# Patient Record
Sex: Female | Born: 1950 | Race: Black or African American | Hispanic: No | Smoking: Never smoker
Health system: Southern US, Community
[De-identification: ages and names within clinical notes are randomized; demographics above are authoritative.]

## PROBLEM LIST (undated history)

## (undated) DIAGNOSIS — E785 Hyperlipidemia, unspecified: Secondary | ICD-10-CM

## (undated) DIAGNOSIS — H269 Unspecified cataract: Secondary | ICD-10-CM

## (undated) DIAGNOSIS — E079 Disorder of thyroid, unspecified: Secondary | ICD-10-CM

## (undated) DIAGNOSIS — I10 Essential (primary) hypertension: Secondary | ICD-10-CM

## (undated) HISTORY — DX: Disorder of thyroid, unspecified: E07.9

## (undated) HISTORY — DX: Hyperlipidemia, unspecified: E78.5

## (undated) HISTORY — DX: Essential (primary) hypertension: I10

## (undated) HISTORY — DX: Unspecified cataract: H26.9

## (undated) HISTORY — PX: CATARACT EXTRACTION: SUR2

---

## 1971-07-29 HISTORY — PX: TONSILLECTOMY: SHX5217

## 1998-11-29 ENCOUNTER — Other Ambulatory Visit: Admission: RE | Admit: 1998-11-29 | Discharge: 1998-11-29 | Payer: Self-pay | Admitting: *Deleted

## 1999-10-07 ENCOUNTER — Other Ambulatory Visit: Admission: RE | Admit: 1999-10-07 | Discharge: 1999-10-07 | Payer: Self-pay | Admitting: Obstetrics & Gynecology

## 2001-05-26 ENCOUNTER — Other Ambulatory Visit: Admission: RE | Admit: 2001-05-26 | Discharge: 2001-05-26 | Payer: Self-pay | Admitting: Obstetrics & Gynecology

## 2001-09-24 ENCOUNTER — Encounter: Admission: RE | Admit: 2001-09-24 | Discharge: 2001-12-23 | Payer: Self-pay | Admitting: Family Medicine

## 2003-05-30 ENCOUNTER — Other Ambulatory Visit: Admission: RE | Admit: 2003-05-30 | Discharge: 2003-05-30 | Payer: Self-pay | Admitting: Obstetrics & Gynecology

## 2003-07-05 ENCOUNTER — Ambulatory Visit (HOSPITAL_COMMUNITY): Admission: RE | Admit: 2003-07-05 | Discharge: 2003-07-05 | Payer: Self-pay | Admitting: Family Medicine

## 2004-11-06 ENCOUNTER — Other Ambulatory Visit: Admission: RE | Admit: 2004-11-06 | Discharge: 2004-11-06 | Payer: Self-pay | Admitting: Obstetrics & Gynecology

## 2007-07-29 HISTORY — PX: COLONOSCOPY: SHX174

## 2009-05-22 ENCOUNTER — Ambulatory Visit (HOSPITAL_COMMUNITY): Admission: RE | Admit: 2009-05-22 | Discharge: 2009-05-22 | Payer: Self-pay | Admitting: Family Medicine

## 2009-05-29 ENCOUNTER — Encounter: Admission: RE | Admit: 2009-05-29 | Discharge: 2009-05-29 | Payer: Self-pay | Admitting: Family Medicine

## 2009-11-27 ENCOUNTER — Encounter: Admission: RE | Admit: 2009-11-27 | Discharge: 2009-11-27 | Payer: Self-pay | Admitting: Family Medicine

## 2011-01-21 ENCOUNTER — Other Ambulatory Visit: Payer: Self-pay | Admitting: Family Medicine

## 2011-01-21 DIAGNOSIS — N6089 Other benign mammary dysplasias of unspecified breast: Secondary | ICD-10-CM

## 2011-03-01 ENCOUNTER — Encounter: Payer: Self-pay | Admitting: Gastroenterology

## 2011-03-18 ENCOUNTER — Ambulatory Visit
Admission: RE | Admit: 2011-03-18 | Discharge: 2011-03-18 | Disposition: A | Discharge status: Home / Self Care | Payer: BC Managed Care – PPO | Source: Ambulatory Visit | Attending: Family Medicine | Admitting: Family Medicine

## 2011-03-18 DIAGNOSIS — N6089 Other benign mammary dysplasias of unspecified breast: Secondary | ICD-10-CM

## 2011-12-08 ENCOUNTER — Encounter: Payer: BC Managed Care – PPO | Attending: Internal Medicine | Admitting: *Deleted

## 2011-12-08 ENCOUNTER — Encounter: Payer: Self-pay | Admitting: *Deleted

## 2011-12-08 DIAGNOSIS — E119 Type 2 diabetes mellitus without complications: Secondary | ICD-10-CM | POA: Insufficient documentation

## 2011-12-08 DIAGNOSIS — Z713 Dietary counseling and surveillance: Secondary | ICD-10-CM | POA: Insufficient documentation

## 2011-12-08 NOTE — Patient Instructions (Addendum)
Goals:  Follow Diabetes Meal Plan as instructed  Eat 3 meals and 2 snacks, every 3-5 hrs  Limit carbohydrate intake to 45 grams carbohydrate/meal  Limit carbohydrate intake to 15 grams carbohydrate/snack  Add lean protein foods to all meals/snacks  Monitor glucose levels two times daily (or as instructed by your doctor):  Fasting - before any medications, food, or drink  2 hours after the first bite of your largest meal  Bring glucose log to your next nutrition visit  Aim for >30 mins of physical activity daily

## 2011-12-08 NOTE — Progress Notes (Signed)
  Medical Nutrition Therapy:  Appt start time: 1130  end time:  1230.  Assessment:  Primary concerns today: T2DM, uncontrolled (250.02).  Jennifer Payne is here today with an A1c of 15.5% (10/13/11) for assessment of T2DM. Had previous education "a long time ago", though does not remember how to CHO count. Went over how to do this. Checks FBG daily and reports 103-128 mg (with one outlier of 175 mg). States her BG has dropped from 300 mg + daily since starting to work with Dr. Baird [facility].  Also reports she has started walking 1-2 days/week.  *See DM flowsheet for more information.   MEDICATIONS: See medication list; reconciled with pt.    DIETARY INTAKE:  Usual eating pattern includes 1-2 meals and 0-2 snacks per day.  24-hr recall:  B (AM): Coffee, small tomato OR leftovers (i.e stewed squash, etc) - weekends has oatmeal and a pack of nabs with granddaughter Snk (AM): None or apple  L (PM): Soup and salad with crackers or croutons Snk (PM): apple D (PM): None Snk (PM): None OR hot chocolate Beverages: Coffee, water (every now and then has hot chocolate or sweet tea)  Estimated energy needs: 1300-1400 calories 145-160 g carbohydrates 85-95 g protein 40-45 g fat  Progress Towards Goal(s):  In progress.   Nutritional Diagnosis:  [place]-2.1 Inpaired nutrition utilization As related to glucose metabolism.  As evidenced by recent A1c of 15.5% and MD referral for MNT.    Intervention:  Nutrition education.  Handouts given during visit include:  Living Well with Diabetes - [employer]  Target Blood Glucose Levels  Low CHO Snack List  [employer] Guide  Monitoring/Evaluation:  Dietary intake, exercise, A1c, BG trends, and body weight in 8 week(s).

## 2012-02-05 ENCOUNTER — Encounter: Payer: BC Managed Care – PPO | Attending: Internal Medicine | Admitting: *Deleted

## 2012-02-05 ENCOUNTER — Encounter: Payer: Self-pay | Admitting: *Deleted

## 2012-02-05 DIAGNOSIS — Z713 Dietary counseling and surveillance: Secondary | ICD-10-CM | POA: Insufficient documentation

## 2012-02-05 DIAGNOSIS — E119 Type 2 diabetes mellitus without complications: Secondary | ICD-10-CM | POA: Insufficient documentation

## 2012-02-05 NOTE — Patient Instructions (Addendum)
Goals:  Follow Diabetes Meal Plan as instructed  Eat 3 meals and 2 snacks, every 3-5 hrs  Limit carbohydrate intake to 45 grams carbohydrate/meal  Limit carbohydrate intake to 15 grams carbohydrate/snack  Add lean protein foods to all meals/snacks  Monitor glucose levels two times daily (or as instructed by your doctor):  Fasting - before any medications, food, or drink  2 hours after the first bite of your largest meal  Bring glucose log to your next nutrition visit  Aim for >30 mins of physical activity daily

## 2012-02-05 NOTE — Progress Notes (Signed)
  Medical Nutrition Therapy:   Appt start time: 1130  end time:  1200.  Primary concerns today: T2DM, uncontrolled (250.02); F/U.  Jennifer Payne is here today for f/u and reports a new A1c of 8.2% (12/2011 per pt).  FBS: 109-112 mg; 138 mg outlier.  No exercise d/t family in town and high school reunion. Tries to walk some and do upper body exercises at night.  Rarely eats after 6-7 pm. Doing well, but not counting CHO at this time.   MEDICATIONS: See medication list; reconciled with pt.    DIETARY INTAKE:  Usual eating pattern includes 2-3 meals and 0-2 snacks per day.  24-hr recall:  B (AM): Almonds, walnuts, oat bran mixture Snk (AM): Crackers/peanut butter or more mixture from above  L (PM): Soup and salad with crackers or croutons OR [employer] chili Snk (PM): None D (PM): 2 pcs chicken (fried), small biscuit; sweet tea Snk (PM): None Beverages: Coffee, water (every now and then has hot chocolate or sweet tea)  Estimated energy needs: 1300-1400 calories 145-160 g carbohydrates 85-95 g protein 40-45 g fat  Progress Towards Goal(s):  Resolved.   Nutritional Diagnosis:  Resolved    Intervention:  Nutrition education.  Monitoring/Evaluation:  Dietary intake, exercise, A1c, BG trends, and body weight in prn.

## 2012-03-15 ENCOUNTER — Other Ambulatory Visit: Payer: Self-pay | Admitting: Internal Medicine

## 2012-03-15 DIAGNOSIS — Z1231 Encounter for screening mammogram for malignant neoplasm of breast: Secondary | ICD-10-CM

## 2012-04-01 ENCOUNTER — Ambulatory Visit
Admission: RE | Admit: 2012-04-01 | Discharge: 2012-04-01 | Disposition: A | Discharge status: Home / Self Care | Payer: BC Managed Care – PPO | Source: Ambulatory Visit | Attending: Internal Medicine | Admitting: Internal Medicine

## 2012-04-01 DIAGNOSIS — Z1231 Encounter for screening mammogram for malignant neoplasm of breast: Secondary | ICD-10-CM

## 2012-04-15 ENCOUNTER — Encounter: Payer: Self-pay | Admitting: Gastroenterology

## 2013-02-24 ENCOUNTER — Other Ambulatory Visit: Payer: Self-pay

## 2013-02-24 DIAGNOSIS — Z1231 Encounter for screening mammogram for malignant neoplasm of breast: Secondary | ICD-10-CM

## 2013-04-07 ENCOUNTER — Ambulatory Visit
Admission: RE | Admit: 2013-04-07 | Discharge: 2013-04-07 | Disposition: A | Discharge status: Home / Self Care | Payer: BC Managed Care – PPO | Source: Ambulatory Visit

## 2013-04-07 DIAGNOSIS — Z1231 Encounter for screening mammogram for malignant neoplasm of breast: Secondary | ICD-10-CM

## 2013-06-28 ENCOUNTER — Ambulatory Visit: Payer: Self-pay

## 2014-02-27 ENCOUNTER — Other Ambulatory Visit: Payer: Self-pay

## 2014-02-27 DIAGNOSIS — Z1231 Encounter for screening mammogram for malignant neoplasm of breast: Secondary | ICD-10-CM

## 2014-03-07 ENCOUNTER — Encounter: Payer: Self-pay | Admitting: Gastroenterology

## 2014-04-19 ENCOUNTER — Ambulatory Visit
Admission: RE | Admit: 2014-04-19 | Discharge: 2014-04-19 | Disposition: A | Discharge status: Home / Self Care | Payer: BC Managed Care – PPO | Source: Ambulatory Visit

## 2014-04-19 DIAGNOSIS — Z1231 Encounter for screening mammogram for malignant neoplasm of breast: Secondary | ICD-10-CM

## 2014-05-10 ENCOUNTER — Ambulatory Visit (AMBULATORY_SURGERY_CENTER): Payer: Self-pay | Admitting: *Deleted

## 2014-05-10 VITALS — Ht 67.0 in | Wt 245.0 lb

## 2014-05-10 DIAGNOSIS — Z1211 Encounter for screening for malignant neoplasm of colon: Secondary | ICD-10-CM

## 2014-05-10 MED ORDER — MOVIPREP 100 G PO SOLR: ORAL | Status: DC

## 2014-05-10 NOTE — Progress Notes (Signed)
Patient denies any allergies to eggs or soy. Patient denies any problems with anesthesia/sedation. Patient denies any oxygen use at home and does not take any diet/weight loss medications. EMMI education assisgned to patient on colonoscopy, this was explained and instructions given to patient. 

## 2014-05-24 ENCOUNTER — Encounter: Payer: Self-pay | Admitting: Gastroenterology

## 2014-05-24 ENCOUNTER — Ambulatory Visit (AMBULATORY_SURGERY_CENTER): Payer: BC Managed Care – PPO | Admitting: Gastroenterology

## 2014-05-24 VITALS — BP 160/87 | HR 66 | Temp 96.6°F | Resp 22 | Ht 67.0 in | Wt 245.0 lb

## 2014-05-24 DIAGNOSIS — Z1211 Encounter for screening for malignant neoplasm of colon: Secondary | ICD-10-CM

## 2014-05-24 DIAGNOSIS — K5730 Diverticulosis of large intestine without perforation or abscess without bleeding: Secondary | ICD-10-CM

## 2014-05-24 MED ORDER — DEXTROSE 5 % IV SOLN: INTRAVENOUS | Status: DC

## 2014-05-24 NOTE — Patient Instructions (Signed)
Impressions/recommendations:  Diverticulosis (handout given) High fiber diet (handout given)  Repeat colonoscopy in 10 years.  YOU HAD AN ENDOSCOPIC PROCEDURE TODAY AT [facility]: Refer to the procedure report that was given to you for any specific questions about what was found during the examination.  If the procedure report does not answer your questions, please call your gastroenterologist to clarify.  If you requested that your care partner not be given the details of your procedure findings, then the procedure report has been included in a sealed envelope for you to review at your convenience later.  YOU SHOULD EXPECT: Some feelings of bloating in the abdomen. Passage of more gas than usual.  Walking can help get rid of the air that was put into your GI tract during the procedure and reduce the bloating. If you had a lower endoscopy (such as a colonoscopy or flexible sigmoidoscopy) you may notice spotting of blood in your stool or on the toilet paper. If you underwent a bowel prep for your procedure, then you may not have a normal bowel movement for a few days.  DIET: Your first meal following the procedure should be a light meal and then it is ok to progress to your normal diet.  A half-sandwich or bowl of soup is an example of a good first meal.  Heavy or fried foods are harder to digest and may make you feel nauseous or bloated.  Likewise meals heavy in dairy and vegetables can cause extra gas to form and this can also increase the bloating.  Drink plenty of fluids but you should avoid alcoholic beverages for 24 hours.  ACTIVITY: Your care partner should take you home directly after the procedure.  You should plan to take it easy, moving slowly for the rest of the day.  You can resume normal activity the day after the procedure however you should NOT DRIVE or use heavy machinery for 24 hours (because of the sedation medicines used during the test).    SYMPTOMS TO REPORT  IMMEDIATELY: A gastroenterologist can be reached at any hour.  During normal business hours, 8:30 AM to 5:00 PM Monday through Friday, call [phone].  After hours and on weekends, please call the GI answering service at [phone] who will take a message and have the physician on call contact you.   Following lower endoscopy (colonoscopy or flexible sigmoidoscopy):  Excessive amounts of blood in the stool  Significant tenderness or worsening of abdominal pains  Swelling of the abdomen that is new, acute  Fever of 100F or higher  FOLLOW UP: If any biopsies were taken you will be contacted by phone or by letter within the next 1-3 weeks.  Call your gastroenterologist if you have not heard about the biopsies in 3 weeks.  Our staff will call the home number listed on your records the next business day following your procedure to check on you and address any questions or concerns that you may have at that time regarding the information given to you following your procedure. This is a courtesy call and so if there is no answer at the home number and we have not heard from you through the emergency physician on call, we will assume that you have returned to your regular daily activities without incident.  SIGNATURES/CONFIDENTIALITY: You and/or your care partner have signed paperwork which will be entered into your electronic medical record.  These signatures attest to the fact that that the information above on your  After Visit Summary has been reviewed and is understood.  Full responsibility of the confidentiality of this discharge information lies with you and/or your care-partner.

## 2014-05-24 NOTE — Progress Notes (Signed)
Report to PACU, RN, vss, BBS= Clear.  

## 2014-05-24 NOTE — Op Note (Signed)
[facility]  [address] [place], [id number]   COLONOSCOPY PROCEDURE REPORT  PATIENT: Jennifer Payne, Jennifer Payne  MR#: [id number] BIRTHDATE: 1951/07/25 , 25  yrs. old GENDER: female ENDOSCOPIST: Inda Castle, MD REFERRED BY: PROCEDURE DATE:  05/24/2014 PROCEDURE:   Colonoscopy, diagnostic First Screening Colonoscopy - Avg.  risk and is 50 yrs.  old or older - No.  Prior Negative Screening - Now for repeat screening. 10 or more years since last screening  History of Adenoma - Now for follow-up colonoscopy & has been > or = to 3 yrs.  N/A  Polyps Removed Today? No.  Recommend repeat exam, <10 yrs? No. ASA CLASS:   Class II INDICATIONS:average risk for colon cancer. MEDICATIONS: Monitored anesthesia care and Propofol 220 mg IV  DESCRIPTION OF PROCEDURE:   After the risks benefits and alternatives of the procedure were thoroughly explained, informed consent was obtained.  The digital rectal exam revealed no abnormalities of the rectum.   The [id number]  endoscope was introduced through the anus and advanced to the cecum, which was identified by both the appendix and ileocecal valve. No adverse events experienced.   The quality of the prep was Suprep good  The instrument was then slowly withdrawn as the colon was fully examined.      COLON FINDINGS: There was mild diverticulosis noted in the sigmoid colon. The remainder the colon including rectum, descending, transverse, descending colon and cecum were normal. Retroflexed views revealed no abnormalities. The time to cecum=5 minutes 23 seconds.  Withdrawal time=7 minutes 29 seconds.  The scope was withdrawn and the procedure completed. COMPLICATIONS: There were no immediate complications.  ENDOSCOPIC IMPRESSION: Mild diverticulosis was noted in the sigmoid colon  RECOMMENDATIONS: Continue current colorectal screening recommendations for "routine risk" patients with a repeat colonoscopy in 10  years.  eSigned:  Inda Castle, MD 05/24/2014 9:55 AM   cc:

## 2014-05-25 ENCOUNTER — Telehealth: Payer: Self-pay | Admitting: Gastroenterology

## 2014-05-25 ENCOUNTER — Telehealth: Payer: Self-pay

## 2014-05-25 NOTE — Telephone Encounter (Signed)
Spoke with pt and she c/o "sneezing and sniffling" since procedure.  No fever.  I told her that many pts c/o this after receiving the oxygen via [employer].  I told her to call back if symptoms persist and understanding voiced

## 2014-05-25 NOTE — Telephone Encounter (Signed)
  Follow up Call-  Call back number 05/24/2014  Post procedure Call Back phone  # [phone]  Permission to leave phone message Yes     Patient questions:  Do you have a fever, pain , or abdominal swelling? No. Pain Score  0 *  Have you tolerated food without any problems? Yes.    Have you been able to return to your normal activities? Yes.    Do you have any questions about your discharge instructions: Diet   No. Medications  No. Follow up visit  No.  Do you have questions or concerns about your Care? No.  Actions: * If pain score is 4 or above: No action needed, pain <4.  No problems noted per the pt. maw

## 2015-03-20 ENCOUNTER — Other Ambulatory Visit: Payer: Self-pay

## 2015-03-20 DIAGNOSIS — Z1231 Encounter for screening mammogram for malignant neoplasm of breast: Secondary | ICD-10-CM

## 2015-05-02 ENCOUNTER — Ambulatory Visit
Admission: RE | Admit: 2015-05-02 | Discharge: 2015-05-02 | Disposition: A | Discharge status: Home / Self Care | Payer: BC Managed Care – PPO | Source: Ambulatory Visit

## 2015-05-02 DIAGNOSIS — Z1231 Encounter for screening mammogram for malignant neoplasm of breast: Secondary | ICD-10-CM

## 2016-03-17 ENCOUNTER — Other Ambulatory Visit: Payer: Self-pay | Admitting: Internal Medicine

## 2016-03-17 ENCOUNTER — Ambulatory Visit
Admission: RE | Admit: 2016-03-17 | Discharge: 2016-03-17 | Disposition: A | Discharge status: Home / Self Care | Payer: BC Managed Care – PPO | Source: Ambulatory Visit | Attending: Internal Medicine | Admitting: Internal Medicine

## 2016-03-17 DIAGNOSIS — Z1231 Encounter for screening mammogram for malignant neoplasm of breast: Secondary | ICD-10-CM

## 2016-03-17 DIAGNOSIS — M7989 Other specified soft tissue disorders: Principal | ICD-10-CM

## 2016-03-17 DIAGNOSIS — M79661 Pain in right lower leg: Secondary | ICD-10-CM

## 2016-05-06 ENCOUNTER — Ambulatory Visit: Payer: BC Managed Care – PPO

## 2016-05-08 ENCOUNTER — Ambulatory Visit
Admission: RE | Admit: 2016-05-08 | Discharge: 2016-05-08 | Disposition: A | Discharge status: Home / Self Care | Payer: BC Managed Care – PPO | Source: Ambulatory Visit | Attending: Internal Medicine | Admitting: Internal Medicine

## 2016-05-08 DIAGNOSIS — Z1231 Encounter for screening mammogram for malignant neoplasm of breast: Secondary | ICD-10-CM

## 2017-03-12 ENCOUNTER — Other Ambulatory Visit: Payer: Self-pay | Admitting: Internal Medicine

## 2017-03-12 DIAGNOSIS — Z1231 Encounter for screening mammogram for malignant neoplasm of breast: Secondary | ICD-10-CM

## 2017-05-20 ENCOUNTER — Ambulatory Visit
Admission: RE | Admit: 2017-05-20 | Discharge: 2017-05-20 | Disposition: A | Discharge status: Home / Self Care | Payer: BC Managed Care – PPO | Source: Ambulatory Visit | Attending: Internal Medicine | Admitting: Internal Medicine

## 2017-05-20 DIAGNOSIS — Z1231 Encounter for screening mammogram for malignant neoplasm of breast: Secondary | ICD-10-CM

## 2017-08-07 ENCOUNTER — Other Ambulatory Visit: Payer: Self-pay

## 2017-08-07 ENCOUNTER — Emergency Department (HOSPITAL_BASED_OUTPATIENT_CLINIC_OR_DEPARTMENT_OTHER): Payer: BC Managed Care – PPO

## 2017-08-07 ENCOUNTER — Emergency Department (HOSPITAL_BASED_OUTPATIENT_CLINIC_OR_DEPARTMENT_OTHER)
Admission: EM | Admit: 2017-08-07 | Discharge: 2017-08-07 | Disposition: A | Discharge status: Home / Self Care | Payer: BC Managed Care – PPO | Attending: Emergency Medicine | Admitting: Emergency Medicine

## 2017-08-07 ENCOUNTER — Encounter (HOSPITAL_BASED_OUTPATIENT_CLINIC_OR_DEPARTMENT_OTHER): Payer: Self-pay | Admitting: *Deleted

## 2017-08-07 DIAGNOSIS — R0602 Shortness of breath: Secondary | ICD-10-CM | POA: Diagnosis not present

## 2017-08-07 DIAGNOSIS — J069 Acute upper respiratory infection, unspecified: Secondary | ICD-10-CM | POA: Diagnosis not present

## 2017-08-07 DIAGNOSIS — E119 Type 2 diabetes mellitus without complications: Secondary | ICD-10-CM | POA: Insufficient documentation

## 2017-08-07 DIAGNOSIS — I10 Essential (primary) hypertension: Secondary | ICD-10-CM | POA: Insufficient documentation

## 2017-08-07 DIAGNOSIS — Z794 Long term (current) use of insulin: Secondary | ICD-10-CM | POA: Diagnosis not present

## 2017-08-07 DIAGNOSIS — Z79899 Other long term (current) drug therapy: Secondary | ICD-10-CM | POA: Insufficient documentation

## 2017-08-07 DIAGNOSIS — Z7982 Long term (current) use of aspirin: Secondary | ICD-10-CM | POA: Diagnosis not present

## 2017-08-07 DIAGNOSIS — R05 Cough: Secondary | ICD-10-CM | POA: Diagnosis not present

## 2017-08-07 NOTE — ED Triage Notes (Signed)
Could symptoms. Cough and chest congestion.

## 2017-08-07 NOTE — ED Provider Notes (Signed)
[facility]PART[facility] Provider Note   CSN: [id number] Arrival date & time: 08/07/17  1335     History   Chief Complaint Chief Complaint  Patient presents with  . Cough    HPI Jennifer Payne is a 67 y.o. female.  HPI Patient presents with cough over the last week.  Minimal production.  If it does produce it is clear.  States she has not coughed up and is been green.  Slight dull chest pain.  States she is worried she could have pneumonia.  May have had some chills last night.  Otherwise feels well.  Good oral intake.  No swelling in her legs.  No abdominal pain.  No clear sick contacts. Past Medical History:  Diagnosis Date  . Cataract   . Diabetes mellitus   . Hyperlipidemia   . Hypertension   . Thyroid disease     There are no active problems to display for this patient.   Past Surgical History:  Procedure Laterality Date  . CATARACT EXTRACTION    . COLONOSCOPY  2009  . TONSILLECTOMY  1973    OB History    No data available       Home Medications    Prior to Admission medications   Medication Sig Start Date End Date Taking? Authorizing Provider  atorvastatin (LIPITOR) 80 MG tablet Take 80 mg by mouth daily.   Yes [provider]  furosemide (LASIX) 40 MG tablet Take 40 mg by mouth daily.   Yes [provider]  insulin detemir (LEVEMIR) 100 UNIT/ML injection Inject 15 Units into the skin at bedtime.    Yes [provider]  levothyroxine (SYNTHROID) 50 MCG tablet Take 88 mcg by mouth daily.    Yes [provider]  amLODipine-olmesartan (AZOR) 10-40 MG per tablet Take 1 tablet by mouth daily.    [provider]  aspirin 81 MG tablet Take 81 mg by mouth daily.    [provider]  carvedilol (COREG) 6.25 MG tablet Take 1 tablet by mouth 2 (two) times daily.    [provider]  Iron-Vitamins (GERITOL PO) Take 1 tablet by mouth as needed.    [provider]    Family  History Family History  Problem Relation Age of Onset  . Alzheimer's disease Mother   . Coronary artery disease Mother   . Hyperlipidemia Mother   . Obesity Mother   . Hypertension Father   . Colon cancer Neg Hx     Social History Social History   Tobacco Use  . Smoking status: Never Smoker  . Smokeless tobacco: Never Used  Substance Use Topics  . Alcohol use: No  . Drug use: No     Allergies   Erythromycin   Review of Systems Review of Systems  Constitutional: Negative for appetite change and fever.  HENT: Negative for congestion.   Eyes: Negative for photophobia.  Respiratory: Positive for cough. Negative for shortness of breath.   Cardiovascular: Negative for chest pain.  Gastrointestinal: Negative for abdominal pain.  Genitourinary: Negative for difficulty urinating.  Musculoskeletal: Negative for back pain.  Neurological: Negative for light-headedness.  Psychiatric/Behavioral: Negative for confusion.     Physical Exam Updated Vital Signs BP (!) 185/103 (BP Location: Left Arm)   Pulse 66   Temp 97.9 F (36.6 C) (Oral)   Resp 16   Ht 5\' 7"  (1.702 m)   Wt 104.3 kg (230 lb)   SpO2 100%   BMI 36.02 kg/m  Physical Exam  Constitutional: She appears well-developed.  HENT:  Head: Normocephalic.  Neck: Neck supple.  Cardiovascular: Normal rate.  Pulmonary/Chest: Effort normal. No stridor. She has no wheezes. She has no rales.  Abdominal: There is no tenderness.  Musculoskeletal: She exhibits no edema.  Neurological: She is alert.  Skin: Skin is warm. Capillary refill takes less than 2 seconds.     ED Treatments / Results  Labs (all labs ordered are listed, but only abnormal results are displayed) Labs Reviewed - No data to display  EKG  EKG Interpretation None       Radiology Dg Chest 2 View  Result Date: 08/07/2017 CLINICAL DATA:  Productive cough with shortness of breath since last Friday EXAM: CHEST  2 VIEW COMPARISON:  June 15, 2015 FINDINGS: The heart size and mediastinal contours are stable. Heart size is enlarged. There is no focal pneumonia or pleural effusion. Prominent bilateral central pulmonary vessel caliber is identified unchanged. The visualized skeletal structures are stable. IMPRESSION: No focal pneumonia. Chronic central pulmonary vascular congestion unchanged compared prior exam. Electronically Signed   By: Abelardo Diesel M.D.   On: 08/07/2017 14:31    Procedures Procedures (including critical care time)  Medications Ordered in ED Medications - No data to display   Initial Impression / Assessment and Plan / ED Course  I have reviewed the triage vital signs and the nursing notes.  Pertinent labs & imaging results that were available during my care of the patient were reviewed by me and considered in my medical decision making (see chart for details).     Patient with URI symptoms.  X-ray negative for pneumonia.  Nonfocal lung findings.  Has hypertension here but states she did not take her medicine today.  Will discharge home.  Final Clinical Impressions(s) / ED Diagnoses   Final diagnoses:  Upper respiratory tract infection, unspecified type    ED Discharge Orders    None       Davonna Belling, MD 08/07/17 1553

## 2017-09-24 DIAGNOSIS — I129 Hypertensive chronic kidney disease with stage 1 through stage 4 chronic kidney disease, or unspecified chronic kidney disease: Secondary | ICD-10-CM | POA: Diagnosis not present

## 2017-09-24 DIAGNOSIS — E039 Hypothyroidism, unspecified: Secondary | ICD-10-CM | POA: Diagnosis not present

## 2017-09-24 DIAGNOSIS — N184 Chronic kidney disease, stage 4 (severe): Secondary | ICD-10-CM | POA: Diagnosis not present

## 2017-09-24 DIAGNOSIS — E1122 Type 2 diabetes mellitus with diabetic chronic kidney disease: Secondary | ICD-10-CM | POA: Diagnosis not present

## 2017-10-07 DIAGNOSIS — I129 Hypertensive chronic kidney disease with stage 1 through stage 4 chronic kidney disease, or unspecified chronic kidney disease: Secondary | ICD-10-CM | POA: Diagnosis not present

## 2017-10-07 DIAGNOSIS — N184 Chronic kidney disease, stage 4 (severe): Secondary | ICD-10-CM | POA: Diagnosis not present

## 2017-10-07 DIAGNOSIS — D631 Anemia in chronic kidney disease: Secondary | ICD-10-CM | POA: Diagnosis not present

## 2017-10-07 DIAGNOSIS — E113599 Type 2 diabetes mellitus with proliferative diabetic retinopathy without macular edema, unspecified eye: Secondary | ICD-10-CM | POA: Diagnosis not present

## 2017-10-07 DIAGNOSIS — E1165 Type 2 diabetes mellitus with hyperglycemia: Secondary | ICD-10-CM | POA: Diagnosis not present

## 2017-10-07 DIAGNOSIS — N2581 Secondary hyperparathyroidism of renal origin: Secondary | ICD-10-CM | POA: Diagnosis not present

## 2017-10-07 DIAGNOSIS — E1122 Type 2 diabetes mellitus with diabetic chronic kidney disease: Secondary | ICD-10-CM | POA: Diagnosis not present

## 2017-10-07 DIAGNOSIS — N183 Chronic kidney disease, stage 3 (moderate): Secondary | ICD-10-CM | POA: Diagnosis not present

## 2018-01-14 DIAGNOSIS — N184 Chronic kidney disease, stage 4 (severe): Secondary | ICD-10-CM | POA: Diagnosis not present

## 2018-01-14 DIAGNOSIS — Z0000 Encounter for general adult medical examination without abnormal findings: Secondary | ICD-10-CM | POA: Diagnosis not present

## 2018-01-14 DIAGNOSIS — E039 Hypothyroidism, unspecified: Secondary | ICD-10-CM | POA: Diagnosis not present

## 2018-01-14 DIAGNOSIS — E113559 Type 2 diabetes mellitus with stable proliferative diabetic retinopathy, unspecified eye: Secondary | ICD-10-CM | POA: Diagnosis not present

## 2018-01-14 DIAGNOSIS — E559 Vitamin D deficiency, unspecified: Secondary | ICD-10-CM | POA: Diagnosis not present

## 2018-01-14 DIAGNOSIS — I129 Hypertensive chronic kidney disease with stage 1 through stage 4 chronic kidney disease, or unspecified chronic kidney disease: Secondary | ICD-10-CM | POA: Diagnosis not present

## 2018-01-14 DIAGNOSIS — E1169 Type 2 diabetes mellitus with other specified complication: Secondary | ICD-10-CM | POA: Diagnosis not present

## 2018-01-14 LAB — BASIC METABOLIC PANEL
BUN: 49 — AB (ref 4–21)
Creatinine: 3.5 — AB (ref 0.5–1.1)
Glucose: 83
POTASSIUM: 4 (ref 3.4–5.3)
SODIUM: 143 (ref 137–147)

## 2018-01-14 LAB — LIPID PANEL
Cholesterol: 209 — AB (ref 0–200)
HDL: 80 — AB (ref 35–70)
LDL Cholesterol: 116
LDl/HDL Ratio: 1.5
Triglycerides: 63 (ref 40–160)

## 2018-01-14 LAB — CBC AND DIFFERENTIAL
HCT: 35 — AB (ref 36–46)
Hemoglobin: 11.1 — AB (ref 12.0–16.0)
Platelets: 218 (ref 150–399)
WBC: 5.4

## 2018-01-14 LAB — HEPATIC FUNCTION PANEL
ALT: 15 (ref 7–35)
AST: 16 (ref 13–35)
Alkaline Phosphatase: 111 (ref 25–125)
Bilirubin, Total: 0.3

## 2018-01-14 LAB — HEMOGLOBIN A1C: Hemoglobin A1C: 7.5

## 2018-02-01 ENCOUNTER — Other Ambulatory Visit: Payer: Self-pay | Admitting: Internal Medicine

## 2018-02-01 DIAGNOSIS — E2839 Other primary ovarian failure: Secondary | ICD-10-CM

## 2018-02-01 DIAGNOSIS — Z1231 Encounter for screening mammogram for malignant neoplasm of breast: Secondary | ICD-10-CM

## 2018-05-04 DIAGNOSIS — Z124 Encounter for screening for malignant neoplasm of cervix: Secondary | ICD-10-CM | POA: Diagnosis not present

## 2018-05-04 DIAGNOSIS — N189 Chronic kidney disease, unspecified: Secondary | ICD-10-CM | POA: Diagnosis not present

## 2018-05-04 DIAGNOSIS — D631 Anemia in chronic kidney disease: Secondary | ICD-10-CM | POA: Diagnosis not present

## 2018-05-04 DIAGNOSIS — E039 Hypothyroidism, unspecified: Secondary | ICD-10-CM | POA: Diagnosis not present

## 2018-05-04 DIAGNOSIS — N184 Chronic kidney disease, stage 4 (severe): Secondary | ICD-10-CM | POA: Diagnosis not present

## 2018-05-04 DIAGNOSIS — E113599 Type 2 diabetes mellitus with proliferative diabetic retinopathy without macular edema, unspecified eye: Secondary | ICD-10-CM | POA: Diagnosis not present

## 2018-05-04 DIAGNOSIS — E7800 Pure hypercholesterolemia, unspecified: Secondary | ICD-10-CM | POA: Diagnosis not present

## 2018-05-04 DIAGNOSIS — E1122 Type 2 diabetes mellitus with diabetic chronic kidney disease: Secondary | ICD-10-CM | POA: Diagnosis not present

## 2018-05-04 DIAGNOSIS — I129 Hypertensive chronic kidney disease with stage 1 through stage 4 chronic kidney disease, or unspecified chronic kidney disease: Secondary | ICD-10-CM | POA: Diagnosis not present

## 2018-05-04 DIAGNOSIS — N2581 Secondary hyperparathyroidism of renal origin: Secondary | ICD-10-CM | POA: Diagnosis not present

## 2018-05-04 DIAGNOSIS — E1165 Type 2 diabetes mellitus with hyperglycemia: Secondary | ICD-10-CM | POA: Diagnosis not present

## 2018-05-12 ENCOUNTER — Other Ambulatory Visit: Payer: Self-pay

## 2018-05-12 DIAGNOSIS — N185 Chronic kidney disease, stage 5: Secondary | ICD-10-CM

## 2018-05-15 ENCOUNTER — Encounter: Payer: Self-pay | Admitting: Internal Medicine

## 2018-05-15 DIAGNOSIS — Z0000 Encounter for general adult medical examination without abnormal findings: Secondary | ICD-10-CM

## 2018-05-19 ENCOUNTER — Ambulatory Visit (INDEPENDENT_AMBULATORY_CARE_PROVIDER_SITE_OTHER): Payer: Medicare Other | Admitting: Internal Medicine

## 2018-05-19 ENCOUNTER — Encounter: Payer: Self-pay | Admitting: Internal Medicine

## 2018-05-19 VITALS — BP 154/86 | HR 54 | Temp 97.9°F | Ht 66.5 in | Wt 231.8 lb

## 2018-05-19 DIAGNOSIS — N184 Chronic kidney disease, stage 4 (severe): Secondary | ICD-10-CM

## 2018-05-19 DIAGNOSIS — E1122 Type 2 diabetes mellitus with diabetic chronic kidney disease: Secondary | ICD-10-CM

## 2018-05-19 DIAGNOSIS — Z23 Encounter for immunization: Secondary | ICD-10-CM | POA: Diagnosis not present

## 2018-05-19 DIAGNOSIS — Z794 Long term (current) use of insulin: Secondary | ICD-10-CM

## 2018-05-19 DIAGNOSIS — E039 Hypothyroidism, unspecified: Secondary | ICD-10-CM

## 2018-05-19 DIAGNOSIS — I129 Hypertensive chronic kidney disease with stage 1 through stage 4 chronic kidney disease, or unspecified chronic kidney disease: Secondary | ICD-10-CM | POA: Diagnosis not present

## 2018-05-19 NOTE — Patient Instructions (Signed)

## 2018-05-19 NOTE — Progress Notes (Signed)
Subjective:     Patient ID: Jennifer Payne , female    DOB: 01-27-1951 , 67 y.o.   MRN: [id number]   Diabetes  She presents for her follow-up diabetic visit. She has type 2 diabetes mellitus. There are no hypoglycemic associated symptoms. There are no hypoglycemic complications. Risk factors for coronary artery disease include diabetes mellitus, dyslipidemia, hypertension, obesity, post-menopausal and sedentary lifestyle.  Hypertension  This is a chronic problem. The current episode started more than 1 year ago. The problem has been gradually improving since onset. The problem is uncontrolled.     Past Medical History:  Diagnosis Date  . Cataract   . Diabetes mellitus   . Hyperlipidemia   . Hypertension   . Thyroid disease       Current Outpatient Medications:  .  ACCU-CHEK FASTCLIX LANCETS MISC, by Does not apply route., Disp: , Rfl:  .  aspirin 81 MG tablet, Take 81 mg by mouth daily., Disp: , Rfl:  .  atorvastatin (LIPITOR) 80 MG tablet, Take 80 mg by mouth daily., Disp: , Rfl:  .  BETA CAROTENE PO, Take by mouth., Disp: , Rfl:  .  furosemide (LASIX) 40 MG tablet, Take 40 mg by mouth daily., Disp: , Rfl:  .  glucose blood (ACCU-CHEK GUIDE) test strip, 1 each by Other route as needed for other. Use as instructed, Disp: , Rfl:  .  insulin detemir (LEVEMIR) 100 UNIT/ML injection, Inject 10 Units into the skin at bedtime. , Disp: , Rfl:  .  Iron-Vitamins (GERITOL PO), Take 1 tablet by mouth as needed., Disp: , Rfl:  .  labetalol (NORMODYNE) 200 MG tablet, labetalol 200 mg tablet, Disp: , Rfl:  .  levothyroxine (SYNTHROID, LEVOTHROID) 88 MCG tablet, Take 88 mcg by mouth daily before breakfast., Disp: , Rfl:    Allergies  Allergen Reactions  . Erythromycin Rash     Review of Systems  Constitutional: Negative.   HENT: Negative.   Eyes: Negative.   Respiratory: Negative.   Cardiovascular: Negative.   Neurological: Negative.   Psychiatric/Behavioral: Negative.      Today's  Vitals   05/19/18 1058  BP: (!) 154/86  Pulse: (!) 54  Temp: 97.9 F (36.6 C)  TempSrc: Oral  Weight: 231 lb 12.8 oz (105.1 kg)  Height: 5' 6.5" (1.689 m)  PainSc: 0-No pain   Body mass index is 36.85 kg/m.   Objective:  Physical Exam  Constitutional: She is oriented to person, place, and time. She appears well-developed and well-nourished.  OBESE  HENT:  Head: Normocephalic and atraumatic.  Eyes: EOM are normal.  Cardiovascular: Normal rate, regular rhythm and normal heart sounds.  Pulmonary/Chest: Effort normal and breath sounds normal.  Neurological: She is alert and oriented to person, place, and time.  Psychiatric: She has a normal mood and affect.  Nursing note and vitals reviewed.       Assessment And Plan:     1. Type 2 diabetes mellitus with stage 4 chronic kidney disease, with long-term current use of insulin (HCC)  I WILL CHECK HER LABS TODAY.  IMPORTANCE OF DIETARY, EXERCISE AND MEDICATION COMPLIANCE WAS DISCUSSED WITH THE PATIENT.  - Ambulatory referral to Nephrology  2. Chronic renal disease, stage IV (HCC)  I WILL REFER HER TO DR. IGWAMEZE IN [place] AS REQUESTED. SHE WOULD LIKE SECOND OPINION ON HER TREATMENT OPTIONS. SHE NO LONGER WISHES TO GO TO [facility].   - Ambulatory referral to Nephrology  3. Hypertensive nephropathy  UNCONTROLLED. SHE  ADMITS THAT SHE HAS YET TO TAKE HER MEDS TODAY. SHE IS ENCOURAGED TO TAKE MEDS AS DIRECTED. SHE IS ALSO ENCOURAGED TO AVOID ADDING SALT TO HER FOODS AND TO AVOID PACKAGED FOODS WHICH TEND TO BE HIGH IN SODIUM.   - Ambulatory referral to Nephrology  4. Primary hypothyroidism I WILL CHECK A THYROID PANEL AND ADJUST MEDS AS NEEDED.   5. Need for vaccination  SHE WAS GIVEN HIGH DOSE FLU VACCINE.  - Flu vaccine HIGH DOSE PF (Fluzone High dose)         Maximino Greenland, MD

## 2018-05-20 LAB — HEMOGLOBIN A1C
ESTIMATED AVERAGE GLUCOSE: 151 mg/dL
Hgb A1c MFr Bld: 6.9 % — ABNORMAL HIGH (ref 4.8–5.6)

## 2018-05-20 LAB — T4, FREE: Free T4: 1.25 ng/dL (ref 0.82–1.77)

## 2018-05-20 LAB — TSH: TSH: 3.34 u[IU]/mL (ref 0.450–4.500)

## 2018-05-23 ENCOUNTER — Encounter: Payer: Self-pay | Admitting: Internal Medicine

## 2018-05-23 NOTE — Progress Notes (Signed)
Your TSH is on the higher end of normal. Are you taking your thyroid meds regularly? Your a1c is great at 6.9, congrats!

## 2018-05-24 ENCOUNTER — Ambulatory Visit
Admission: RE | Admit: 2018-05-24 | Discharge: 2018-05-24 | Disposition: A | Discharge status: Home / Self Care | Payer: BC Managed Care – PPO | Source: Ambulatory Visit | Attending: Internal Medicine | Admitting: Internal Medicine

## 2018-05-24 DIAGNOSIS — Z1231 Encounter for screening mammogram for malignant neoplasm of breast: Secondary | ICD-10-CM | POA: Diagnosis not present

## 2018-05-24 DIAGNOSIS — M8589 Other specified disorders of bone density and structure, multiple sites: Secondary | ICD-10-CM | POA: Diagnosis not present

## 2018-05-24 DIAGNOSIS — Z780 Asymptomatic menopausal state: Secondary | ICD-10-CM | POA: Diagnosis not present

## 2018-05-24 DIAGNOSIS — E2839 Other primary ovarian failure: Secondary | ICD-10-CM

## 2018-05-28 DIAGNOSIS — I10 Essential (primary) hypertension: Secondary | ICD-10-CM | POA: Diagnosis not present

## 2018-05-28 DIAGNOSIS — N184 Chronic kidney disease, stage 4 (severe): Secondary | ICD-10-CM | POA: Diagnosis not present

## 2018-05-28 DIAGNOSIS — R809 Proteinuria, unspecified: Secondary | ICD-10-CM | POA: Diagnosis not present

## 2018-05-28 DIAGNOSIS — N390 Urinary tract infection, site not specified: Secondary | ICD-10-CM | POA: Diagnosis not present

## 2018-05-28 DIAGNOSIS — E119 Type 2 diabetes mellitus without complications: Secondary | ICD-10-CM | POA: Diagnosis not present

## 2018-05-29 ENCOUNTER — Telehealth: Payer: Self-pay

## 2018-05-29 NOTE — Telephone Encounter (Signed)
-----   Message from Glendale Chard, MD sent at 05/24/2018  5:57 PM EDT ----- Bone density results: She has osteopenia - thinning of her bones. Important for her to take vit d/calcium. Most important for her to engage in weightbearing exercises like walking at least 3 days per week. Once she has osteoporosis, she will need to take another medication.

## 2018-05-29 NOTE — Telephone Encounter (Signed)
Left the pt a message to call back for her lab results. 

## 2018-05-29 NOTE — Telephone Encounter (Signed)
-----   Message from Glendale Chard, MD sent at 05/23/2018 11:45 PM EDT ----- Your TSH is on the higher end of normal. Are you taking your thyroid meds regularly? Your a1c is great at 6.9, congrats!

## 2018-05-29 NOTE — Telephone Encounter (Signed)
Left the pt a message to call back for lab results and bone density results.

## 2018-06-03 DIAGNOSIS — I081 Rheumatic disorders of both mitral and tricuspid valves: Secondary | ICD-10-CM | POA: Diagnosis not present

## 2018-06-03 DIAGNOSIS — I501 Left ventricular failure: Secondary | ICD-10-CM | POA: Diagnosis not present

## 2018-06-03 DIAGNOSIS — R001 Bradycardia, unspecified: Secondary | ICD-10-CM | POA: Diagnosis not present

## 2018-06-03 DIAGNOSIS — R9431 Abnormal electrocardiogram [ECG] [EKG]: Secondary | ICD-10-CM | POA: Diagnosis not present

## 2018-06-22 DIAGNOSIS — E119 Type 2 diabetes mellitus without complications: Secondary | ICD-10-CM | POA: Diagnosis not present

## 2018-06-22 DIAGNOSIS — N184 Chronic kidney disease, stage 4 (severe): Secondary | ICD-10-CM | POA: Diagnosis not present

## 2018-06-22 DIAGNOSIS — N189 Chronic kidney disease, unspecified: Secondary | ICD-10-CM | POA: Diagnosis not present

## 2018-06-22 DIAGNOSIS — I10 Essential (primary) hypertension: Secondary | ICD-10-CM | POA: Diagnosis not present

## 2018-06-23 ENCOUNTER — Encounter (HOSPITAL_COMMUNITY): Payer: BC Managed Care – PPO

## 2018-06-23 ENCOUNTER — Other Ambulatory Visit (HOSPITAL_COMMUNITY): Payer: BC Managed Care – PPO

## 2018-06-23 ENCOUNTER — Encounter: Payer: BC Managed Care – PPO | Admitting: Vascular Surgery

## 2018-06-28 DIAGNOSIS — I10 Essential (primary) hypertension: Secondary | ICD-10-CM | POA: Diagnosis not present

## 2018-06-28 DIAGNOSIS — N184 Chronic kidney disease, stage 4 (severe): Secondary | ICD-10-CM | POA: Diagnosis not present

## 2018-06-28 DIAGNOSIS — N390 Urinary tract infection, site not specified: Secondary | ICD-10-CM | POA: Diagnosis not present

## 2018-06-28 DIAGNOSIS — E119 Type 2 diabetes mellitus without complications: Secondary | ICD-10-CM | POA: Diagnosis not present

## 2018-06-29 ENCOUNTER — Other Ambulatory Visit: Payer: Self-pay | Admitting: Internal Medicine

## 2018-07-09 DIAGNOSIS — N184 Chronic kidney disease, stage 4 (severe): Secondary | ICD-10-CM | POA: Diagnosis not present

## 2018-07-09 DIAGNOSIS — I10 Essential (primary) hypertension: Secondary | ICD-10-CM | POA: Diagnosis not present

## 2018-07-09 DIAGNOSIS — E119 Type 2 diabetes mellitus without complications: Secondary | ICD-10-CM | POA: Diagnosis not present

## 2018-07-12 DIAGNOSIS — E119 Type 2 diabetes mellitus without complications: Secondary | ICD-10-CM | POA: Diagnosis not present

## 2018-07-12 DIAGNOSIS — N390 Urinary tract infection, site not specified: Secondary | ICD-10-CM | POA: Diagnosis not present

## 2018-07-12 DIAGNOSIS — N185 Chronic kidney disease, stage 5: Secondary | ICD-10-CM | POA: Diagnosis not present

## 2018-07-12 DIAGNOSIS — I10 Essential (primary) hypertension: Secondary | ICD-10-CM | POA: Diagnosis not present

## 2018-07-22 ENCOUNTER — Other Ambulatory Visit: Payer: Self-pay | Admitting: Internal Medicine

## 2018-08-05 DIAGNOSIS — E119 Type 2 diabetes mellitus without complications: Secondary | ICD-10-CM | POA: Diagnosis not present

## 2018-08-05 DIAGNOSIS — N185 Chronic kidney disease, stage 5: Secondary | ICD-10-CM | POA: Diagnosis not present

## 2018-08-05 DIAGNOSIS — I10 Essential (primary) hypertension: Secondary | ICD-10-CM | POA: Diagnosis not present

## 2018-08-05 DIAGNOSIS — N184 Chronic kidney disease, stage 4 (severe): Secondary | ICD-10-CM | POA: Diagnosis not present

## 2018-08-09 DIAGNOSIS — I10 Essential (primary) hypertension: Secondary | ICD-10-CM | POA: Diagnosis not present

## 2018-08-09 DIAGNOSIS — E119 Type 2 diabetes mellitus without complications: Secondary | ICD-10-CM | POA: Diagnosis not present

## 2018-08-09 DIAGNOSIS — N390 Urinary tract infection, site not specified: Secondary | ICD-10-CM | POA: Diagnosis not present

## 2018-08-09 DIAGNOSIS — N184 Chronic kidney disease, stage 4 (severe): Secondary | ICD-10-CM | POA: Diagnosis not present

## 2018-08-19 ENCOUNTER — Encounter: Payer: Self-pay | Admitting: Internal Medicine

## 2018-08-19 ENCOUNTER — Ambulatory Visit (INDEPENDENT_AMBULATORY_CARE_PROVIDER_SITE_OTHER): Payer: Medicare HMO | Admitting: Internal Medicine

## 2018-08-19 VITALS — BP 148/76 | HR 50 | Temp 97.7°F | Ht 66.5 in | Wt 223.0 lb

## 2018-08-19 DIAGNOSIS — N184 Chronic kidney disease, stage 4 (severe): Secondary | ICD-10-CM

## 2018-08-19 DIAGNOSIS — I129 Hypertensive chronic kidney disease with stage 1 through stage 4 chronic kidney disease, or unspecified chronic kidney disease: Secondary | ICD-10-CM

## 2018-08-19 DIAGNOSIS — E1122 Type 2 diabetes mellitus with diabetic chronic kidney disease: Secondary | ICD-10-CM

## 2018-08-19 DIAGNOSIS — Z6835 Body mass index (BMI) 35.0-35.9, adult: Secondary | ICD-10-CM | POA: Diagnosis not present

## 2018-08-19 DIAGNOSIS — Z794 Long term (current) use of insulin: Secondary | ICD-10-CM | POA: Diagnosis not present

## 2018-08-19 DIAGNOSIS — E113599 Type 2 diabetes mellitus with proliferative diabetic retinopathy without macular edema, unspecified eye: Secondary | ICD-10-CM | POA: Diagnosis not present

## 2018-08-19 DIAGNOSIS — E039 Hypothyroidism, unspecified: Secondary | ICD-10-CM | POA: Diagnosis not present

## 2018-08-19 DIAGNOSIS — E66812 Obesity, class 2: Secondary | ICD-10-CM | POA: Insufficient documentation

## 2018-08-19 NOTE — Patient Instructions (Signed)
Diabetes Mellitus and Exercise Exercising regularly is important for your overall health, especially when you have diabetes (diabetes mellitus). Exercising is not only about losing weight. It has many other health benefits, such as increasing muscle strength and bone density and reducing body fat and stress. This leads to improved fitness, flexibility, and endurance, all of which result in better overall health. Exercise has additional benefits for people with diabetes, including:  Reducing appetite.  Helping to lower and control blood glucose.  Lowering blood pressure.  Helping to control amounts of fatty substances (lipids) in the blood, such as cholesterol and triglycerides.  Helping the body to respond better to insulin (improving insulin sensitivity).  Reducing how much insulin the body needs.  Decreasing the risk for heart disease by: ? Lowering cholesterol and triglyceride levels. ? Increasing the levels of good cholesterol. ? Lowering blood glucose levels. What is my activity plan? Your health care provider or certified diabetes educator can help you make a plan for the type and frequency of exercise (activity plan) that works for you. Make sure that you:  Do at least 150 minutes of moderate-intensity or vigorous-intensity exercise each week. This could be brisk walking, biking, or water aerobics. ? Do stretching and strength exercises, such as yoga or weightlifting, at least 2 times a week. ? Spread out your activity over at least 3 days of the week.  Get some form of physical activity every day. ? Do not go more than 2 days in a row without some kind of physical activity. ? Avoid being inactive for more than 30 minutes at a time. Take frequent breaks to walk or stretch.  Choose a type of exercise or activity that you enjoy, and set realistic goals.  Start slowly, and gradually increase the intensity of your exercise over time. What do I need to know about managing my  diabetes?   Check your blood glucose before and after exercising. ? If your blood glucose is 240 mg/dL (13.3 mmol/L) or higher before you exercise, check your urine for ketones. If you have ketones in your urine, do not exercise until your blood glucose returns to normal. ? If your blood glucose is 100 mg/dL (5.6 mmol/L) or lower, eat a snack containing 15-20 grams of carbohydrate. Check your blood glucose 15 minutes after the snack to make sure that your level is above 100 mg/dL (5.6 mmol/L) before you start your exercise.  Know the symptoms of low blood glucose (hypoglycemia) and how to treat it. Your risk for hypoglycemia increases during and after exercise. Common symptoms of hypoglycemia can include: ? Hunger. ? Anxiety. ? Sweating and feeling clammy. ? Confusion. ? Dizziness or feeling light-headed. ? Increased heart rate or palpitations. ? Blurry vision. ? Tingling or numbness around the mouth, lips, or tongue. ? Tremors or shakes. ? Irritability.  Keep a rapid-acting carbohydrate snack available before, during, and after exercise to help prevent or treat hypoglycemia.  Avoid injecting insulin into areas of the body that are going to be exercised. For example, avoid injecting insulin into: ? The arms, when playing tennis. ? The legs, when jogging.  Keep records of your exercise habits. Doing this can help you and your health care provider adjust your diabetes management plan as needed. Write down: ? Food that you eat before and after you exercise. ? Blood glucose levels before and after you exercise. ? The type and amount of exercise you have done. ? When your insulin is expected to peak, if you use   insulin. Avoid exercising at times when your insulin is peaking.  When you start a new exercise or activity, work with your health care provider to make sure the activity is safe for you, and to adjust your insulin, medicines, or food intake as needed.  Drink plenty of water while  you exercise to prevent dehydration or heat stroke. Drink enough fluid to keep your urine clear or pale yellow. Summary  Exercising regularly is important for your overall health, especially when you have diabetes (diabetes mellitus).  Exercising has many health benefits, such as increasing muscle strength and bone density and reducing body fat and stress.  Your health care provider or certified diabetes educator can help you make a plan for the type and frequency of exercise (activity plan) that works for you.  When you start a new exercise or activity, work with your health care provider to make sure the activity is safe for you, and to adjust your insulin, medicines, or food intake as needed. This information is not intended to replace advice given to you by your health care provider. Make sure you discuss any questions you have with your health care provider. Document Released: 10/04/2003 Document Revised: 01/22/2017 Document Reviewed: 12/24/2015 Elsevier Interactive Patient Education  2019 Elsevier Inc.  

## 2018-08-20 LAB — CMP14+EGFR
A/G RATIO: 1.3 (ref 1.2–2.2)
ALBUMIN: 4 g/dL (ref 3.8–4.8)
ALT: 16 IU/L (ref 0–32)
AST: 16 IU/L (ref 0–40)
Alkaline Phosphatase: 105 IU/L (ref 39–117)
BUN/Creatinine Ratio: 17 (ref 12–28)
BUN: 75 mg/dL — ABNORMAL HIGH (ref 8–27)
Bilirubin Total: 0.4 mg/dL (ref 0.0–1.2)
CALCIUM: 8.8 mg/dL (ref 8.7–10.3)
CO2: 24 mmol/L (ref 20–29)
Chloride: 99 mmol/L (ref 96–106)
Creatinine, Ser: 4.29 mg/dL — ABNORMAL HIGH (ref 0.57–1.00)
GFR calc Af Amer: 12 mL/min/{1.73_m2} — ABNORMAL LOW (ref >59)
GFR, EST NON AFRICAN AMERICAN: 10 mL/min/{1.73_m2} — AB (ref >59)
GLOBULIN, TOTAL: 3.1 g/dL (ref 1.5–4.5)
Glucose: 80 mg/dL (ref 65–99)
POTASSIUM: 4.1 mmol/L (ref 3.5–5.2)
Sodium: 140 mmol/L (ref 134–144)
Total Protein: 7.1 g/dL (ref 6.0–8.5)

## 2018-08-20 LAB — LIPID PANEL
CHOLESTEROL TOTAL: 151 mg/dL (ref 100–199)
Chol/HDL Ratio: 2.6 ratio (ref 0.0–4.4)
HDL: 59 mg/dL (ref >39)
LDL Calculated: 78 mg/dL (ref 0–99)
TRIGLYCERIDES: 68 mg/dL (ref 0–149)
VLDL Cholesterol Cal: 14 mg/dL (ref 5–40)

## 2018-08-20 LAB — T4, FREE: FREE T4: 1.55 ng/dL (ref 0.82–1.77)

## 2018-08-20 LAB — HEMOGLOBIN A1C
Est. average glucose Bld gHb Est-mCnc: 166 mg/dL
Hgb A1c MFr Bld: 7.4 % — ABNORMAL HIGH (ref 4.8–5.6)

## 2018-08-20 LAB — TSH: TSH: 2.31 u[IU]/mL (ref 0.450–4.500)

## 2018-08-21 ENCOUNTER — Encounter: Payer: Self-pay | Admitting: Internal Medicine

## 2018-08-21 NOTE — Progress Notes (Signed)
Subjective:     Patient ID: Jennifer Payne , female    DOB: 09-Nov-1950 , 68 y.o.   MRN: [id number]   Chief Complaint  Patient presents with  . Diabetes  . Hypertension    HPI  Diabetes  She presents for her follow-up diabetic visit. She has type 2 diabetes mellitus. Her disease course has been stable. There are no hypoglycemic associated symptoms. Pertinent negatives for diabetes include no blurred vision and no chest pain. There are no hypoglycemic complications. Diabetic complications include nephropathy and retinopathy. Risk factors for coronary artery disease include diabetes mellitus, dyslipidemia, hypertension, obesity, sedentary lifestyle and post-menopausal. She is following a diabetic diet.  Hypertension  This is a chronic problem. The current episode started more than 1 year ago. The problem has been gradually improving since onset. The problem is controlled. Pertinent negatives include no blurred vision, chest pain, palpitations or shortness of breath. Hypertensive end-organ damage includes retinopathy.     Past Medical History:  Diagnosis Date  . Cataract   . Diabetes mellitus   . Hyperlipidemia   . Hypertension   . Thyroid disease      Family History  Problem Relation Age of Onset  . Alzheimer's disease Mother   . Coronary artery disease Mother   . Hyperlipidemia Mother   . Obesity Mother   . Hypertension Father   . Colon cancer Neg Hx      Current Outpatient Medications:  .  ACCU-CHEK FASTCLIX LANCETS MISC, by Does not apply route., Disp: , Rfl:  .  amLODipine (NORVASC) 5 MG tablet, , Disp: , Rfl:  .  atorvastatin (LIPITOR) 80 MG tablet, TAKE 1 TABLET BY MOUTH ONCE DAILY, Disp: 90 tablet, Rfl: 1 .  BD PEN NEEDLE NANO U/F 32G X 4 MM MISC, USE AS DIRECTED WITH LEVEMIR PEN, Disp: , Rfl:  .  BETA CAROTENE PO, Take by mouth., Disp: , Rfl:  .  glucose blood (ACCU-CHEK GUIDE) test strip, 1 each by Other route as needed for other. Use as instructed, Disp: , Rfl:  .   hydrALAZINE (APRESOLINE) 25 MG tablet, , Disp: , Rfl:  .  Insulin Detemir (LEVEMIR FLEXTOUCH) 100 UNIT/ML Pen, Inject 10 Units into the skin daily., Disp: , Rfl:  .  levothyroxine (SYNTHROID, LEVOTHROID) 88 MCG tablet, Take 88 mcg by mouth daily before breakfast., Disp: , Rfl:  .  metolazone (ZAROXOLYN) 2.5 MG tablet, Monday, Wednesday, Friday, Disp: , Rfl:  .  metoprolol succinate (TOPROL-XL) 100 MG 24 hr tablet, TAKE 1 TABLET BY MOUTH ONCE DAILY IN THE MORNING, Disp: , Rfl:  .  torsemide (DEMADEX) 20 MG tablet, , Disp: , Rfl:    Allergies  Allergen Reactions  . Erythromycin Rash     Review of Systems  Constitutional: Negative.   Eyes: Negative for blurred vision.  Respiratory: Negative.  Negative for shortness of breath.   Cardiovascular: Negative.  Negative for chest pain and palpitations.  Gastrointestinal: Negative.   Neurological: Negative.   Psychiatric/Behavioral: Negative.      Today's Vitals   08/19/18 1023  BP: (!) 148/76  Pulse: (!) 50  Temp: 97.7 F (36.5 C)  TempSrc: Oral  Weight: 223 lb (101.2 kg)  Height: 5' 6.5" (1.689 m)  PainSc: 0-No pain   Body mass index is 35.45 kg/m.   Objective:  Physical Exam Vitals signs and nursing note reviewed.  Constitutional:      Appearance: Normal appearance. She is obese.  HENT:     Head: Normocephalic and  atraumatic.  Cardiovascular:     Rate and Rhythm: Normal rate and regular rhythm.     Heart sounds: Murmur present.  Pulmonary:     Effort: Pulmonary effort is normal.     Breath sounds: Normal breath sounds.  Skin:    General: Skin is warm.  Neurological:     General: No focal deficit present.     Mental Status: She is alert.         Assessment And Plan:     1. Type 2 diabetes mellitus with stage 4 chronic kidney disease, with long-term current use of insulin ([place])  I will check labs as listed below.  I will refer her for eye exam. She is followed by ophthalmology for retinopathy.   - Lipid  Profile - CMP14+EGFR - Hemoglobin A1c - Ambulatory referral to Ophthalmology  2. Hypertensive nephropathy  Fair control. She will continue with current meds. She is encouraged to avoid adding salt to her foods. Importance of regular exercise was discussed with the patient.   3. Primary hypothyroidism  I will check a thyroid panel and adjust meds as needed.   - TSH - T4, Free  4. 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 Again, importance of glycemic control was discussed with the patient.   5. Class 2 severe obesity due to excess calories with serious comorbidity and body mass index (BMI) of 35.0 to 35.9 in adult [facility])  She is encouraged to strive for BMI less than 30 to decrease cardiac risk.  Again, importance of regular exercise was discussed with the patient. She is encouraged to work up to 30 minutes five days weekly.   Maximino Greenland, MD

## 2018-09-17 DIAGNOSIS — E119 Type 2 diabetes mellitus without complications: Secondary | ICD-10-CM | POA: Diagnosis not present

## 2018-09-17 DIAGNOSIS — N184 Chronic kidney disease, stage 4 (severe): Secondary | ICD-10-CM | POA: Diagnosis not present

## 2018-09-17 DIAGNOSIS — I10 Essential (primary) hypertension: Secondary | ICD-10-CM | POA: Diagnosis not present

## 2018-09-17 DIAGNOSIS — Z7689 Persons encountering health services in other specified circumstances: Secondary | ICD-10-CM | POA: Diagnosis not present

## 2018-09-20 DIAGNOSIS — I10 Essential (primary) hypertension: Secondary | ICD-10-CM | POA: Diagnosis not present

## 2018-09-20 DIAGNOSIS — D631 Anemia in chronic kidney disease: Secondary | ICD-10-CM | POA: Diagnosis not present

## 2018-09-20 DIAGNOSIS — D509 Iron deficiency anemia, unspecified: Secondary | ICD-10-CM | POA: Diagnosis not present

## 2018-09-20 DIAGNOSIS — N184 Chronic kidney disease, stage 4 (severe): Secondary | ICD-10-CM | POA: Diagnosis not present

## 2018-09-20 DIAGNOSIS — E119 Type 2 diabetes mellitus without complications: Secondary | ICD-10-CM | POA: Diagnosis not present

## 2018-09-20 DIAGNOSIS — N390 Urinary tract infection, site not specified: Secondary | ICD-10-CM | POA: Diagnosis not present

## 2018-09-20 DIAGNOSIS — D518 Other vitamin B12 deficiency anemias: Secondary | ICD-10-CM | POA: Diagnosis not present

## 2018-09-20 DIAGNOSIS — D649 Anemia, unspecified: Secondary | ICD-10-CM | POA: Diagnosis not present

## 2018-09-23 DIAGNOSIS — N184 Chronic kidney disease, stage 4 (severe): Secondary | ICD-10-CM | POA: Diagnosis not present

## 2018-09-23 DIAGNOSIS — D631 Anemia in chronic kidney disease: Secondary | ICD-10-CM | POA: Diagnosis not present

## 2018-09-23 DIAGNOSIS — I10 Essential (primary) hypertension: Secondary | ICD-10-CM | POA: Diagnosis not present

## 2018-09-30 ENCOUNTER — Other Ambulatory Visit: Payer: Self-pay | Admitting: Internal Medicine

## 2018-09-30 DIAGNOSIS — D631 Anemia in chronic kidney disease: Secondary | ICD-10-CM | POA: Diagnosis not present

## 2018-09-30 DIAGNOSIS — I10 Essential (primary) hypertension: Secondary | ICD-10-CM | POA: Diagnosis not present

## 2018-09-30 DIAGNOSIS — N184 Chronic kidney disease, stage 4 (severe): Secondary | ICD-10-CM | POA: Diagnosis not present

## 2018-10-07 DIAGNOSIS — D631 Anemia in chronic kidney disease: Secondary | ICD-10-CM | POA: Diagnosis not present

## 2018-10-07 DIAGNOSIS — N184 Chronic kidney disease, stage 4 (severe): Secondary | ICD-10-CM | POA: Diagnosis not present

## 2018-10-07 DIAGNOSIS — I10 Essential (primary) hypertension: Secondary | ICD-10-CM | POA: Diagnosis not present

## 2018-10-11 ENCOUNTER — Other Ambulatory Visit: Payer: Self-pay | Admitting: Internal Medicine

## 2018-10-12 DIAGNOSIS — N2581 Secondary hyperparathyroidism of renal origin: Secondary | ICD-10-CM | POA: Diagnosis not present

## 2018-10-12 DIAGNOSIS — E1122 Type 2 diabetes mellitus with diabetic chronic kidney disease: Secondary | ICD-10-CM | POA: Diagnosis not present

## 2018-10-12 DIAGNOSIS — E113599 Type 2 diabetes mellitus with proliferative diabetic retinopathy without macular edema, unspecified eye: Secondary | ICD-10-CM | POA: Diagnosis not present

## 2018-10-12 DIAGNOSIS — I129 Hypertensive chronic kidney disease with stage 1 through stage 4 chronic kidney disease, or unspecified chronic kidney disease: Secondary | ICD-10-CM | POA: Diagnosis not present

## 2018-10-12 DIAGNOSIS — E785 Hyperlipidemia, unspecified: Secondary | ICD-10-CM | POA: Diagnosis not present

## 2018-10-12 DIAGNOSIS — Z794 Long term (current) use of insulin: Secondary | ICD-10-CM | POA: Diagnosis not present

## 2018-10-12 DIAGNOSIS — E039 Hypothyroidism, unspecified: Secondary | ICD-10-CM | POA: Diagnosis not present

## 2018-10-12 DIAGNOSIS — I951 Orthostatic hypotension: Secondary | ICD-10-CM | POA: Diagnosis not present

## 2018-10-12 DIAGNOSIS — K5900 Constipation, unspecified: Secondary | ICD-10-CM | POA: Diagnosis not present

## 2018-10-12 DIAGNOSIS — E669 Obesity, unspecified: Secondary | ICD-10-CM | POA: Diagnosis not present

## 2018-10-14 DIAGNOSIS — I10 Essential (primary) hypertension: Secondary | ICD-10-CM | POA: Diagnosis not present

## 2018-10-14 DIAGNOSIS — N184 Chronic kidney disease, stage 4 (severe): Secondary | ICD-10-CM | POA: Diagnosis not present

## 2018-10-14 DIAGNOSIS — D631 Anemia in chronic kidney disease: Secondary | ICD-10-CM | POA: Diagnosis not present

## 2018-10-19 DIAGNOSIS — N390 Urinary tract infection, site not specified: Secondary | ICD-10-CM | POA: Diagnosis not present

## 2018-10-19 DIAGNOSIS — N184 Chronic kidney disease, stage 4 (severe): Secondary | ICD-10-CM | POA: Diagnosis not present

## 2018-10-19 DIAGNOSIS — I10 Essential (primary) hypertension: Secondary | ICD-10-CM | POA: Diagnosis not present

## 2018-10-19 DIAGNOSIS — E119 Type 2 diabetes mellitus without complications: Secondary | ICD-10-CM | POA: Diagnosis not present

## 2018-10-20 DIAGNOSIS — I120 Hypertensive chronic kidney disease with stage 5 chronic kidney disease or end stage renal disease: Secondary | ICD-10-CM | POA: Diagnosis not present

## 2018-10-20 DIAGNOSIS — E1122 Type 2 diabetes mellitus with diabetic chronic kidney disease: Secondary | ICD-10-CM | POA: Diagnosis not present

## 2018-10-20 DIAGNOSIS — N185 Chronic kidney disease, stage 5: Secondary | ICD-10-CM | POA: Diagnosis not present

## 2018-10-20 DIAGNOSIS — K436 Other and unspecified ventral hernia with obstruction, without gangrene: Secondary | ICD-10-CM | POA: Diagnosis not present

## 2018-10-22 DIAGNOSIS — N184 Chronic kidney disease, stage 4 (severe): Secondary | ICD-10-CM | POA: Diagnosis not present

## 2018-10-22 DIAGNOSIS — E213 Hyperparathyroidism, unspecified: Secondary | ICD-10-CM | POA: Diagnosis not present

## 2018-10-22 DIAGNOSIS — E119 Type 2 diabetes mellitus without complications: Secondary | ICD-10-CM | POA: Diagnosis not present

## 2018-10-22 DIAGNOSIS — N185 Chronic kidney disease, stage 5: Secondary | ICD-10-CM | POA: Diagnosis not present

## 2018-10-22 DIAGNOSIS — I10 Essential (primary) hypertension: Secondary | ICD-10-CM | POA: Diagnosis not present

## 2018-10-25 DIAGNOSIS — N184 Chronic kidney disease, stage 4 (severe): Secondary | ICD-10-CM | POA: Diagnosis not present

## 2018-10-25 DIAGNOSIS — D631 Anemia in chronic kidney disease: Secondary | ICD-10-CM | POA: Diagnosis not present

## 2018-10-25 DIAGNOSIS — I10 Essential (primary) hypertension: Secondary | ICD-10-CM | POA: Diagnosis not present

## 2018-10-27 DIAGNOSIS — N186 End stage renal disease: Secondary | ICD-10-CM | POA: Diagnosis not present

## 2018-10-27 DIAGNOSIS — I10 Essential (primary) hypertension: Secondary | ICD-10-CM | POA: Diagnosis not present

## 2018-10-27 DIAGNOSIS — D631 Anemia in chronic kidney disease: Secondary | ICD-10-CM | POA: Diagnosis not present

## 2018-10-28 DIAGNOSIS — R1904 Left lower quadrant abdominal swelling, mass and lump: Secondary | ICD-10-CM | POA: Diagnosis not present

## 2018-10-28 DIAGNOSIS — E1122 Type 2 diabetes mellitus with diabetic chronic kidney disease: Secondary | ICD-10-CM | POA: Diagnosis not present

## 2018-10-28 DIAGNOSIS — Z4902 Encounter for fitting and adjustment of peritoneal dialysis catheter: Secondary | ICD-10-CM | POA: Diagnosis not present

## 2018-10-28 DIAGNOSIS — D171 Benign lipomatous neoplasm of skin and subcutaneous tissue of trunk: Secondary | ICD-10-CM | POA: Diagnosis not present

## 2018-10-28 DIAGNOSIS — N186 End stage renal disease: Secondary | ICD-10-CM | POA: Diagnosis not present

## 2018-10-28 DIAGNOSIS — N185 Chronic kidney disease, stage 5: Secondary | ICD-10-CM | POA: Diagnosis not present

## 2018-10-28 DIAGNOSIS — Z79899 Other long term (current) drug therapy: Secondary | ICD-10-CM | POA: Diagnosis not present

## 2018-10-28 DIAGNOSIS — Z794 Long term (current) use of insulin: Secondary | ICD-10-CM | POA: Diagnosis not present

## 2018-10-28 DIAGNOSIS — I120 Hypertensive chronic kidney disease with stage 5 chronic kidney disease or end stage renal disease: Secondary | ICD-10-CM | POA: Diagnosis not present

## 2018-10-28 DIAGNOSIS — K436 Other and unspecified ventral hernia with obstruction, without gangrene: Secondary | ICD-10-CM | POA: Diagnosis not present

## 2018-10-28 DIAGNOSIS — K439 Ventral hernia without obstruction or gangrene: Secondary | ICD-10-CM | POA: Diagnosis not present

## 2018-11-02 DIAGNOSIS — N184 Chronic kidney disease, stage 4 (severe): Secondary | ICD-10-CM | POA: Diagnosis not present

## 2018-11-02 DIAGNOSIS — N390 Urinary tract infection, site not specified: Secondary | ICD-10-CM | POA: Diagnosis not present

## 2018-11-02 DIAGNOSIS — B189 Chronic viral hepatitis, unspecified: Secondary | ICD-10-CM | POA: Diagnosis not present

## 2018-11-02 DIAGNOSIS — N185 Chronic kidney disease, stage 5: Secondary | ICD-10-CM | POA: Diagnosis not present

## 2018-11-02 DIAGNOSIS — E119 Type 2 diabetes mellitus without complications: Secondary | ICD-10-CM | POA: Diagnosis not present

## 2018-11-02 DIAGNOSIS — I10 Essential (primary) hypertension: Secondary | ICD-10-CM | POA: Diagnosis not present

## 2018-11-03 DIAGNOSIS — D631 Anemia in chronic kidney disease: Secondary | ICD-10-CM | POA: Diagnosis not present

## 2018-11-03 DIAGNOSIS — N2581 Secondary hyperparathyroidism of renal origin: Secondary | ICD-10-CM | POA: Diagnosis not present

## 2018-11-03 DIAGNOSIS — D509 Iron deficiency anemia, unspecified: Secondary | ICD-10-CM | POA: Diagnosis not present

## 2018-11-03 DIAGNOSIS — N186 End stage renal disease: Secondary | ICD-10-CM | POA: Diagnosis not present

## 2018-11-08 DIAGNOSIS — E119 Type 2 diabetes mellitus without complications: Secondary | ICD-10-CM | POA: Diagnosis not present

## 2018-11-08 DIAGNOSIS — Z4932 Encounter for adequacy testing for peritoneal dialysis: Secondary | ICD-10-CM | POA: Diagnosis not present

## 2018-11-10 ENCOUNTER — Other Ambulatory Visit: Payer: Self-pay | Admitting: Internal Medicine

## 2018-11-13 DIAGNOSIS — N186 End stage renal disease: Secondary | ICD-10-CM | POA: Diagnosis not present

## 2018-11-14 DIAGNOSIS — N186 End stage renal disease: Secondary | ICD-10-CM | POA: Diagnosis not present

## 2018-11-15 DIAGNOSIS — N186 End stage renal disease: Secondary | ICD-10-CM | POA: Diagnosis not present

## 2018-11-16 DIAGNOSIS — N186 End stage renal disease: Secondary | ICD-10-CM | POA: Diagnosis not present

## 2018-11-17 DIAGNOSIS — Z4902 Encounter for fitting and adjustment of peritoneal dialysis catheter: Secondary | ICD-10-CM | POA: Diagnosis not present

## 2018-11-17 DIAGNOSIS — N186 End stage renal disease: Secondary | ICD-10-CM | POA: Diagnosis not present

## 2018-11-17 DIAGNOSIS — Z992 Dependence on renal dialysis: Secondary | ICD-10-CM | POA: Diagnosis not present

## 2018-11-18 ENCOUNTER — Ambulatory Visit: Payer: Medicare HMO | Admitting: Internal Medicine

## 2018-11-18 DIAGNOSIS — N186 End stage renal disease: Secondary | ICD-10-CM | POA: Diagnosis not present

## 2018-11-19 DIAGNOSIS — N186 End stage renal disease: Secondary | ICD-10-CM | POA: Diagnosis not present

## 2018-11-20 DIAGNOSIS — N186 End stage renal disease: Secondary | ICD-10-CM | POA: Diagnosis not present

## 2018-11-21 DIAGNOSIS — N186 End stage renal disease: Secondary | ICD-10-CM | POA: Diagnosis not present

## 2018-11-22 DIAGNOSIS — N186 End stage renal disease: Secondary | ICD-10-CM | POA: Diagnosis not present

## 2018-11-23 DIAGNOSIS — N186 End stage renal disease: Secondary | ICD-10-CM | POA: Diagnosis not present

## 2018-11-24 DIAGNOSIS — N186 End stage renal disease: Secondary | ICD-10-CM | POA: Diagnosis not present

## 2018-11-25 DIAGNOSIS — N186 End stage renal disease: Secondary | ICD-10-CM | POA: Diagnosis not present

## 2018-11-26 DIAGNOSIS — D631 Anemia in chronic kidney disease: Secondary | ICD-10-CM | POA: Diagnosis not present

## 2018-11-26 DIAGNOSIS — Z23 Encounter for immunization: Secondary | ICD-10-CM | POA: Diagnosis not present

## 2018-11-26 DIAGNOSIS — N186 End stage renal disease: Secondary | ICD-10-CM | POA: Diagnosis not present

## 2018-11-26 DIAGNOSIS — D509 Iron deficiency anemia, unspecified: Secondary | ICD-10-CM | POA: Diagnosis not present

## 2018-11-26 DIAGNOSIS — I10 Essential (primary) hypertension: Secondary | ICD-10-CM | POA: Diagnosis not present

## 2018-11-26 DIAGNOSIS — N2581 Secondary hyperparathyroidism of renal origin: Secondary | ICD-10-CM | POA: Diagnosis not present

## 2018-11-30 ENCOUNTER — Other Ambulatory Visit: Payer: Self-pay | Admitting: Internal Medicine

## 2018-11-30 DIAGNOSIS — E119 Type 2 diabetes mellitus without complications: Secondary | ICD-10-CM | POA: Diagnosis not present

## 2018-12-14 DIAGNOSIS — Z4932 Encounter for adequacy testing for peritoneal dialysis: Secondary | ICD-10-CM | POA: Diagnosis not present

## 2018-12-18 ENCOUNTER — Other Ambulatory Visit: Payer: Self-pay | Admitting: Internal Medicine

## 2018-12-23 DIAGNOSIS — Z4932 Encounter for adequacy testing for peritoneal dialysis: Secondary | ICD-10-CM | POA: Diagnosis not present

## 2018-12-27 DIAGNOSIS — D509 Iron deficiency anemia, unspecified: Secondary | ICD-10-CM | POA: Diagnosis not present

## 2018-12-27 DIAGNOSIS — Z23 Encounter for immunization: Secondary | ICD-10-CM | POA: Diagnosis not present

## 2018-12-27 DIAGNOSIS — D631 Anemia in chronic kidney disease: Secondary | ICD-10-CM | POA: Diagnosis not present

## 2018-12-27 DIAGNOSIS — N186 End stage renal disease: Secondary | ICD-10-CM | POA: Diagnosis not present

## 2018-12-27 DIAGNOSIS — N2581 Secondary hyperparathyroidism of renal origin: Secondary | ICD-10-CM | POA: Diagnosis not present

## 2018-12-27 DIAGNOSIS — I10 Essential (primary) hypertension: Secondary | ICD-10-CM | POA: Diagnosis not present

## 2018-12-28 DIAGNOSIS — E119 Type 2 diabetes mellitus without complications: Secondary | ICD-10-CM | POA: Diagnosis not present

## 2018-12-29 ENCOUNTER — Telehealth: Payer: Self-pay | Admitting: Internal Medicine

## 2018-12-29 NOTE — Telephone Encounter (Signed)
Patient has requested and confirmed her virtual visit with Dr.Sanders on 12/30/2018 °

## 2018-12-30 ENCOUNTER — Other Ambulatory Visit: Payer: Self-pay

## 2018-12-30 ENCOUNTER — Ambulatory Visit (INDEPENDENT_AMBULATORY_CARE_PROVIDER_SITE_OTHER): Payer: Medicare HMO | Admitting: Internal Medicine

## 2018-12-30 ENCOUNTER — Encounter: Payer: Self-pay | Admitting: Internal Medicine

## 2018-12-30 VITALS — BP 108/59 | HR 51 | Temp 95.7°F | Ht 66.5 in | Wt 211.0 lb

## 2018-12-30 DIAGNOSIS — E1122 Type 2 diabetes mellitus with diabetic chronic kidney disease: Secondary | ICD-10-CM

## 2018-12-30 DIAGNOSIS — Z992 Dependence on renal dialysis: Secondary | ICD-10-CM

## 2018-12-30 DIAGNOSIS — L309 Dermatitis, unspecified: Secondary | ICD-10-CM | POA: Diagnosis not present

## 2018-12-30 DIAGNOSIS — N186 End stage renal disease: Secondary | ICD-10-CM

## 2018-12-30 DIAGNOSIS — I129 Hypertensive chronic kidney disease with stage 1 through stage 4 chronic kidney disease, or unspecified chronic kidney disease: Secondary | ICD-10-CM

## 2018-12-30 DIAGNOSIS — Z6833 Body mass index (BMI) 33.0-33.9, adult: Secondary | ICD-10-CM

## 2018-12-30 DIAGNOSIS — Z79899 Other long term (current) drug therapy: Secondary | ICD-10-CM

## 2018-12-30 DIAGNOSIS — E6609 Other obesity due to excess calories: Secondary | ICD-10-CM

## 2018-12-30 DIAGNOSIS — E039 Hypothyroidism, unspecified: Secondary | ICD-10-CM | POA: Diagnosis not present

## 2018-12-30 DIAGNOSIS — I120 Hypertensive chronic kidney disease with stage 5 chronic kidney disease or end stage renal disease: Secondary | ICD-10-CM | POA: Diagnosis not present

## 2018-12-30 MED ORDER — CLOBETASOL PROPIONATE 0.05 % EX OINT: 1.0000 "application " | TOPICAL_OINTMENT | Freq: Two times a day (BID) | CUTANEOUS | 0 refills | Status: DC

## 2018-12-30 MED ORDER — FREESTYLE LIBRE 14 DAY READER DEVI: 1.0000 | Freq: Four times a day (QID) | 1 refills | Status: DC

## 2018-12-30 MED ORDER — FREESTYLE LIBRE 14 DAY SENSOR MISC: 1.0000 | Freq: Four times a day (QID) | 1 refills | Status: DC

## 2018-12-30 NOTE — Patient Instructions (Signed)

## 2018-12-30 NOTE — Progress Notes (Signed)
Virtual Visit via Video   This visit type was conducted due to national recommendations for restrictions regarding the COVID-19 Pandemic (e.g. social distancing) in an effort to limit this patient's exposure and mitigate transmission in our community.  Due to her co-morbid illnesses, this patient is at least at moderate risk for complications without adequate follow up.  This format is felt to be most appropriate for this patient at this time.  All issues noted in this document were discussed and addressed.  A limited physical exam was performed with this format.    This visit type was conducted due to national recommendations for restrictions regarding the COVID-19 Pandemic (e.g. social distancing) in an effort to limit this patient's exposure and mitigate transmission in our community.  Patients identity confirmed using two different identifiers.  This format is felt to be most appropriate for this patient at this time.  All issues noted in this document were discussed and addressed.  No physical exam was performed (except for noted visual exam findings with Video Visits).    Date:  01/02/2019   ID:  Jennifer Payne, DOB [birth year], MRN [id number]  Patient Location:  Home  Provider location:   Office    Chief Complaint:  DM f/u  History of Present Illness:    Jennifer Payne is a 68 y.o. female who presents via video conferencing for a telehealth visit today.    The patient does not have symptoms concerning for COVID-19 infection (fever, chills, cough, or new shortness of breath).   She presents today for virtual visit. She prefers this method of contact due to COVID-19 pandemic.  She reports she is now performing peritoneal dialysis daily at home. .  Diabetes  She presents for her follow-up diabetic visit. She has type 2 diabetes mellitus. Her disease course has been stable. There are no hypoglycemic associated symptoms. Pertinent negatives for diabetes include no blurred vision and  no chest pain. There are no hypoglycemic complications. Diabetic complications include nephropathy and retinopathy. Risk factors for coronary artery disease include diabetes mellitus, dyslipidemia, hypertension, obesity, sedentary lifestyle and post-menopausal. She is following a diabetic diet. She participates in exercise intermittently. An ACE inhibitor/angiotensin II receptor blocker is contraindicated.  Hypertension  This is a chronic problem. The current episode started more than 1 year ago. The problem has been gradually improving since onset. The problem is controlled. Pertinent negatives include no blurred vision, chest pain, palpitations or shortness of breath. Past treatments include angiotensin blockers, ACE inhibitors and diuretics. The current treatment provides moderate improvement. Compliance problems include exercise.  Hypertensive end-organ damage includes kidney disease and retinopathy.     Past Medical History:  Diagnosis Date  . Cataract   . Diabetes mellitus   . Hyperlipidemia   . Hypertension   . Thyroid disease    Past Surgical History:  Procedure Laterality Date  . CATARACT EXTRACTION    . COLONOSCOPY  2009  . TONSILLECTOMY  1973     Current Meds  Medication Sig  . ACCU-CHEK FASTCLIX LANCETS MISC by Does not apply route.  Marland Kitchen amLODipine (NORVASC) 5 MG tablet   . atorvastatin (LIPITOR) 80 MG tablet TAKE 1 TABLET BY MOUTH ONCE DAILY  . BD PEN NEEDLE NANO U/F 32G X 4 MM MISC USE AS DIRECTED WITH LEVEMIR PEN  . calcitRIOL (ROCALTROL) 0.5 MCG capsule Take 0.5 mcg by mouth daily.  Arna Medici 88 MCG tablet Take 1 tablet by mouth once daily  . glucose blood (ACCU-CHEK GUIDE) test  strip 1 each by Other route as needed for other. Use as instructed  . hydrALAZINE (APRESOLINE) 25 MG tablet   . LEVEMIR FLEXTOUCH 100 UNIT/ML Pen INJECT 20 UNITS SUBCUTANEOUSLY ONCE DAILY AT BEDTIME (Patient taking differently: 10 Units. )  . metolazone (ZAROXOLYN) 2.5 MG tablet Monday,  Wednesday, Friday  . metoprolol succinate (TOPROL-XL) 100 MG 24 hr tablet TAKE 1 TABLET BY MOUTH ONCE DAILY IN THE MORNING  . torsemide (DEMADEX) 20 MG tablet      Allergies:   Erythromycin   Social History   Tobacco Use  . Smoking status: Never Smoker  . Smokeless tobacco: Never Used  Substance Use Topics  . Alcohol use: No  . Drug use: No     Family Hx: The patient's family history includes Alzheimer's disease in her mother; Coronary artery disease in her mother; Hyperlipidemia in her mother; Hypertension in her father; Obesity in her mother. There is no history of Colon cancer.  ROS:   Please see the history of present illness.    Review of Systems  Constitutional: Negative.   Eyes: Negative for blurred vision.  Respiratory: Negative.  Negative for shortness of breath.   Cardiovascular: Negative.  Negative for chest pain and palpitations.  Gastrointestinal: Negative.   Skin: Positive for rash.  Neurological: Negative.   Psychiatric/Behavioral: Negative.     All other systems reviewed and are negative.   Labs/Other Tests and Data Reviewed:    Recent Labs: 01/14/2018: Hemoglobin 11.1; Platelets 218 08/19/2018: ALT 16; BUN 75; Creatinine, Ser 4.29; Potassium 4.1; Sodium 140; TSH 2.310   Recent Lipid Panel Lab Results  Component Value Date/Time   CHOL 151 08/19/2018 11:10 AM   TRIG 68 08/19/2018 11:10 AM   HDL 59 08/19/2018 11:10 AM   CHOLHDL 2.6 08/19/2018 11:10 AM   LDLCALC 78 08/19/2018 11:10 AM    Wt Readings from Last 3 Encounters:  12/30/18 211 lb (95.7 kg)  08/19/18 223 lb (101.2 kg)  05/19/18 231 lb 12.8 oz (105.1 kg)     Exam:    Vital Signs:  BP (!) 108/59 (BP Location: Left Arm, Patient Position: Sitting, Cuff Size: Normal) Comment: pt provided  Pulse (!) 51 Comment: pt provided  Temp (!) 95.7 F (35.4 C) (Oral)   Ht 5' 6.5" (1.689 m)   Wt 211 lb (95.7 kg)   BMI 33.55 kg/m     Physical Exam  Constitutional: She is oriented to person,  place, and time and well-developed, well-nourished, and in no distress.  HENT:  Head: Normocephalic and atraumatic.  Neck: Normal range of motion.  Pulmonary/Chest: Effort normal.  Neurological: She is alert and oriented to person, place, and time.  Skin:  Scaly, hyperpigmented skin b/l LE  Psychiatric: Affect normal.  Nursing note and vitals reviewed.   ASSESSMENT & PLAN:    1. Type 2 diabetes mellitus with end-stage renal disease (HCC)  Chronic. Her sugars are likely markedly elevated due to glucose load on peritoneal dialysate. I will consult help from CCM pharmacist/nurse to help her achieve optimal glycemic control. She is aware that her insulin regimen will likely change. She is encouraged to check sugars regularly so a definitive treatment plan can be developed. (unable to determine needed regimen if sugar readings are unknown)  2. Hypertensive nephropathy  Chronic. She is encouraged to limit her salt intake and to take meds as directed.   3. Primary hypothyroidism  Chronic. She agrees to having bloodwork drawn at her dialysis site. I will forward her a  labslip.   4. Dermatitis  Chronic. She was given rx temovate to use on affected area prn.   5. Class 1 obesity due to excess calories with serious comorbidity and body mass index (BMI) of 33.0 to 33.9 in adult  Importance of achieving optimal weight to decrease risk of cardiovascular disease and cancers was discussed with the patient in full detail. She is encouraged to start slowly - start with 10 minutes twice daily at least three to four days per week and to gradually build to 30 minutes five days weekly. She was given tips to incorporate more activity into her daily routine - take stairs when possible, park farther away from grocery stores, etc.    6. Peritoneal dialysis status ([place])   7. Drug therapy    COVID-19 Education: The signs and symptoms of COVID-19 were discussed with the patient and how to seek care for  testing (follow up with PCP or arrange E-visit).  The importance of social distancing was discussed today.  Patient Risk:   After full review of this patients clinical status, I feel that they are at least moderate risk at this time.  Time:   Today, I have spent 22 minutes/ 30 seconds with the patient with telehealth technology discussing above diagnoses.     Medication Adjustments/Labs and Tests Ordered: Current medicines are reviewed at length with the patient today.  Concerns regarding medicines are outlined above.   Tests Ordered: Orders Placed This Encounter  Procedures  . Referral to Chronic Care Management Services    Medication Changes: Meds ordered this encounter  Medications  . clobetasol ointment (TEMOVATE) 0.05 %    Sig: Apply 1 application topically 2 (two) times daily.    Dispense:  30 g    Refill:  0    Disposition:  Follow up in 3 month(s)  Signed, Maximino Greenland, MD

## 2019-01-05 ENCOUNTER — Ambulatory Visit: Payer: Self-pay

## 2019-01-05 DIAGNOSIS — I129 Hypertensive chronic kidney disease with stage 1 through stage 4 chronic kidney disease, or unspecified chronic kidney disease: Secondary | ICD-10-CM

## 2019-01-05 DIAGNOSIS — N186 End stage renal disease: Secondary | ICD-10-CM

## 2019-01-05 DIAGNOSIS — E1122 Type 2 diabetes mellitus with diabetic chronic kidney disease: Secondary | ICD-10-CM

## 2019-01-05 NOTE — Chronic Care Management (AMB) (Signed)
  Care Management Note   Jennifer Payne is a 68 y.o. year old female who is a primary care patient of Glendale Chard, MD . The CM team was consulted for assistance with chronic care management.  Review of patient status, including review of consultants reports, and collaboration with appropriate care team members and the patient's provider was performed as part of comprehensive patient evaluation and provision of chronic care management services. Telephone outreach to patient today to introduce CCM services.   I reached out to [name] by phone today.   [name] was given information about Chronic Care Management services today including:  1. CCM service includes personalized support from designated clinical staff supervised by her physician, including individualized plan of care and coordination with other care providers 2. 24/7 contact phone numbers for assistance for urgent and routine care needs. 3. Service will only be billed when office clinical staff spend 20 minutes or more in a month to coordinate care. 4. Only one practitioner may furnish and bill the service in a calendar month. 5. The patient may stop CCM services at any time (effective at the end of the month) by phone call to the office staff. 6. The patient will be responsible for cost sharing (co-pay) of up to 20% of the service fee (after annual deductible is met).   Patient did not agree to services during today's call. The patient reports she has just recently started dialysis but does not feel she needs assistance from care management team. The patient is agreeable to a second outreach call from [facility] to review current medical condition and evaluate if care management services would benefit the patient.   Follow Up Plan: CCM RN Case Manager will outreach the patient over the next 2-3 weeks to discuss enrollment.   Daneen Schick, BSW, CDP Social Worker, Certified Dementia Practitioner  [facility] [phone]  Total time spent performing care coordination and/or care management activities with the patient by phone or face to face = 6 minutes.

## 2019-01-14 ENCOUNTER — Telehealth: Payer: Self-pay

## 2019-01-15 ENCOUNTER — Other Ambulatory Visit: Payer: Self-pay | Admitting: Internal Medicine

## 2019-01-18 ENCOUNTER — Other Ambulatory Visit: Payer: Self-pay | Admitting: Internal Medicine

## 2019-01-24 ENCOUNTER — Telehealth: Payer: Self-pay

## 2019-01-26 DIAGNOSIS — D631 Anemia in chronic kidney disease: Secondary | ICD-10-CM | POA: Diagnosis not present

## 2019-01-26 DIAGNOSIS — D509 Iron deficiency anemia, unspecified: Secondary | ICD-10-CM | POA: Diagnosis not present

## 2019-01-26 DIAGNOSIS — N186 End stage renal disease: Secondary | ICD-10-CM | POA: Diagnosis not present

## 2019-01-26 DIAGNOSIS — Z23 Encounter for immunization: Secondary | ICD-10-CM | POA: Diagnosis not present

## 2019-01-26 DIAGNOSIS — I10 Essential (primary) hypertension: Secondary | ICD-10-CM | POA: Diagnosis not present

## 2019-01-26 DIAGNOSIS — N2581 Secondary hyperparathyroidism of renal origin: Secondary | ICD-10-CM | POA: Diagnosis not present

## 2019-01-27 ENCOUNTER — Other Ambulatory Visit: Payer: Self-pay | Admitting: Internal Medicine

## 2019-02-01 DIAGNOSIS — E119 Type 2 diabetes mellitus without complications: Secondary | ICD-10-CM | POA: Diagnosis not present

## 2019-02-03 DIAGNOSIS — Z4932 Encounter for adequacy testing for peritoneal dialysis: Secondary | ICD-10-CM | POA: Diagnosis not present

## 2019-02-17 ENCOUNTER — Telehealth: Payer: Self-pay

## 2019-02-17 ENCOUNTER — Ambulatory Visit: Payer: BC Managed Care – PPO

## 2019-02-23 ENCOUNTER — Encounter: Payer: Self-pay | Admitting: Internal Medicine

## 2019-02-23 ENCOUNTER — Ambulatory Visit (INDEPENDENT_AMBULATORY_CARE_PROVIDER_SITE_OTHER): Payer: Medicare HMO

## 2019-02-23 ENCOUNTER — Other Ambulatory Visit: Payer: Self-pay

## 2019-02-23 ENCOUNTER — Ambulatory Visit (INDEPENDENT_AMBULATORY_CARE_PROVIDER_SITE_OTHER): Payer: Medicare HMO | Admitting: Internal Medicine

## 2019-02-23 ENCOUNTER — Ambulatory Visit (INDEPENDENT_AMBULATORY_CARE_PROVIDER_SITE_OTHER): Payer: Medicare HMO | Admitting: Pharmacist

## 2019-02-23 VITALS — BP 112/62 | HR 65 | Temp 97.8°F | Ht 66.6 in | Wt 206.8 lb

## 2019-02-23 DIAGNOSIS — N186 End stage renal disease: Secondary | ICD-10-CM

## 2019-02-23 DIAGNOSIS — Z794 Long term (current) use of insulin: Secondary | ICD-10-CM

## 2019-02-23 DIAGNOSIS — R252 Cramp and spasm: Secondary | ICD-10-CM

## 2019-02-23 DIAGNOSIS — Z992 Dependence on renal dialysis: Secondary | ICD-10-CM

## 2019-02-23 DIAGNOSIS — Z0000 Encounter for general adult medical examination without abnormal findings: Secondary | ICD-10-CM | POA: Diagnosis not present

## 2019-02-23 DIAGNOSIS — E1122 Type 2 diabetes mellitus with diabetic chronic kidney disease: Secondary | ICD-10-CM

## 2019-02-23 DIAGNOSIS — Z6832 Body mass index (BMI) 32.0-32.9, adult: Secondary | ICD-10-CM | POA: Diagnosis not present

## 2019-02-23 DIAGNOSIS — E039 Hypothyroidism, unspecified: Secondary | ICD-10-CM

## 2019-02-23 DIAGNOSIS — E113599 Type 2 diabetes mellitus with proliferative diabetic retinopathy without macular edema, unspecified eye: Secondary | ICD-10-CM | POA: Diagnosis not present

## 2019-02-23 DIAGNOSIS — I129 Hypertensive chronic kidney disease with stage 1 through stage 4 chronic kidney disease, or unspecified chronic kidney disease: Secondary | ICD-10-CM | POA: Diagnosis not present

## 2019-02-23 DIAGNOSIS — E6609 Other obesity due to excess calories: Secondary | ICD-10-CM | POA: Diagnosis not present

## 2019-02-23 DIAGNOSIS — N184 Chronic kidney disease, stage 4 (severe): Secondary | ICD-10-CM

## 2019-02-23 LAB — POCT URINALYSIS DIPSTICK
Blood, UA: NEGATIVE
Glucose, UA: NEGATIVE
Nitrite, UA: NEGATIVE
Protein, UA: POSITIVE — AB
Spec Grav, UA: 1.025 (ref 1.010–1.025)
Urobilinogen, UA: 0.2 E.U./dL
pH, UA: 5 (ref 5.0–8.0)

## 2019-02-23 LAB — POCT UA - MICROALBUMIN
Creatinine, POC: 300 mg/dL
Microalbumin Ur, POC: 150 mg/L

## 2019-02-23 NOTE — Progress Notes (Signed)
Subjective:   Jennifer Payne is a 68 y.o. female who presents for an Initial Medicare Annual Wellness Visit.  Review of Systems    n/a  Cardiac Risk Factors include: advanced age (>109men, >53 women);diabetes mellitus;hypertension;obesity (BMI >30kg/m2)     Objective:    Today's Vitals   02/23/19 0950  BP: 112/62  Pulse: 65  Temp: 97.8 F (36.6 C)  TempSrc: Oral  SpO2: 98%  Weight: 206 lb 12.8 oz (93.8 kg)  Height: 5' 6.6" (1.692 m)   Body mass index is 32.78 kg/m.  Advanced Directives 02/23/2019 05/10/2014  Does Patient Have a Medical Advance Directive? No No  Would patient like information on creating a medical advance directive? Yes (MAU/Ambulatory/Procedural Areas - Information given) -    Current Medications (verified) Outpatient Encounter Medications as of 02/23/2019  Medication Sig  . Accu-Chek FastClix Lancets MISC USE TO CHECK BLOOD SUGAR TWICE DAILY  . ACCU-CHEK GUIDE test strip USE 1 STRIP TO CHECK GLUCOSE TWICE DAILY  . amLODipine (NORVASC) 5 MG tablet   . atorvastatin (LIPITOR) 80 MG tablet Take 1 tablet by mouth once daily  . BD PEN NEEDLE NANO U/F 32G X 4 MM MISC USE AS DIRECTED WITH LEVEMIR PEN  . calcitRIOL (ROCALTROL) 0.5 MCG capsule Take 0.5 mcg by mouth daily.  . clobetasol ointment (TEMOVATE) 3.32 % Apply 1 application topically 2 (two) times daily.  . Continuous Blood Gluc Receiver (FREESTYLE LIBRE 14 DAY READER) DEVI Inject 1 each into the skin 4 (four) times daily. To check blood sugars dx: e11.22  . Continuous Blood Gluc Sensor (FREESTYLE LIBRE 14 DAY SENSOR) MISC Inject 1 each into the skin QID. Dx:e11.22  . EUTHYROX 88 MCG tablet Take 1 tablet by mouth once daily  . hydrALAZINE (APRESOLINE) 25 MG tablet   . LEVEMIR FLEXTOUCH 100 UNIT/ML Pen INJECT 20 UNITS SUBCUTANEOUSLY ONCE DAILY AT BEDTIME  . metolazone (ZAROXOLYN) 2.5 MG tablet Monday, Wednesday, Friday  . metoprolol succinate (TOPROL-XL) 100 MG 24 hr tablet TAKE 1 TABLET BY MOUTH ONCE  DAILY IN THE MORNING  . torsemide (DEMADEX) 20 MG tablet    No facility-administered encounter medications on file as of 02/23/2019.     Allergies (verified) Erythromycin   History: Past Medical History:  Diagnosis Date  . Cataract   . Diabetes mellitus   . Hyperlipidemia   . Hypertension   . Thyroid disease    Past Surgical History:  Procedure Laterality Date  . CATARACT EXTRACTION    . COLONOSCOPY  2009  . TONSILLECTOMY  1973   Family History  Problem Relation Age of Onset  . Alzheimer's disease Mother   . Coronary artery disease Mother   . Hyperlipidemia Mother   . Obesity Mother   . Hypertension Father   . Colon cancer Neg Hx    Social History   Socioeconomic History  . Marital status: Divorced    Spouse name: Not on file  . Number of children: Not on file  . Years of education: Not on file  . Highest education level: Not on file  Occupational History  . Occupation: retired  Scientific laboratory technician  . Financial resource strain: Not hard at all  . Food insecurity    Worry: Never true    Inability: Never true  . Transportation needs    Medical: No    Non-medical: No  Tobacco Use  . Smoking status: Never Smoker  . Smokeless tobacco: Never Used  Substance and Sexual Activity  . Alcohol use: No  .  Drug use: No  . Sexual activity: Not Currently  Lifestyle  . Physical activity    Days per week: 2 days    Minutes per session: 30 min  . Stress: Not at all  Relationships  . Social Herbalist on phone: Not on file    Gets together: Not on file    Attends religious service: Not on file    Active member of club or organization: Not on file    Attends meetings of clubs or organizations: Not on file    Relationship status: Not on file  Other Topics Concern  . Not on file  Social History Narrative  . Not on file    Tobacco Counseling Counseling given: Not Answered   Clinical Intake:  Pre-visit preparation completed: Yes  Pain : No/denies pain      Nutritional Status: BMI > 30  Obese Nutritional Risks: None Diabetes: Yes CBG done?: No Did pt. bring in CBG monitor from home?: No  How often do you need to have someone help you when you read instructions, pamphlets, or other written materials from your doctor or pharmacy?: 1 - Never What is the last grade level you completed in school?: master's degree  Interpreter Needed?: No  Information entered by :: NAllen LPN   Activities of Daily Living In your present state of health, do you have any difficulty performing the following activities: 02/23/2019  Hearing? N  Vision? N  Difficulty concentrating or making decisions? Y  Comment forgetfulness  Walking or climbing stairs? N  Dressing or bathing? N  Doing errands, shopping? N  Preparing Food and eating ? N  Using the Toilet? N  In the past six months, have you accidently leaked urine? N  Do you have problems with loss of bowel control? N  Managing your Medications? N  Managing your Finances? N  Housekeeping or managing your Housekeeping? N  Some recent data might be hidden     Immunizations and Health Maintenance Immunization History  Administered Date(s) Administered  . DTaP 06/13/2013  . Influenza, High Dose Seasonal PF 05/19/2018   Health Maintenance Due  Topic Date Due  . FOOT EXAM  08/30/1959  . OPHTHALMOLOGY EXAM  08/30/1959  . PNA vac Low Risk Adult (1 of 2 - PCV13) 08/29/2014  . HEMOGLOBIN A1C  02/17/2019    Patient Care Team: Glendale Chard, MD as PCP - General (Internal Medicine)  Indicate any recent Medical Services you may have received from other than [facility] providers in the past year (date may be approximate).     Assessment:   This is a routine wellness examination for [name].  Hearing/Vision screen  Hearing Screening   125Hz  250Hz  500Hz  1000Hz  2000Hz  3000Hz  4000Hz  6000Hz  8000Hz   Right ear:           Left ear:           Vision Screening Comments: No annual eye exams  Dietary issues and  exercise activities discussed: Current Exercise Habits: Home exercise routine, Type of exercise: walking, Time (Minutes): 30, Frequency (Times/Week): 2, Weekly Exercise (Minutes/Week): 60  Goals    . Patient Stated     02/23/2019, to make an appointment for the dentist      Depression Screen PHQ 2/9 Scores 02/23/2019 12/30/2018 08/19/2018 05/19/2018  PHQ - 2 Score 0 0 0 0  PHQ- 9 Score 1 - - -    Fall Risk Fall Risk  02/23/2019 12/30/2018 08/19/2018 05/19/2018  Falls in the past year?  0 0 0 No  Risk for fall due to : Medication side effect - - -  Follow up Falls evaluation completed;Education provided;Falls prevention discussed - - -    Is the patient's home free of loose throw rugs in walkways, pet beds, electrical cords, etc?   yes      Grab bars in the bathroom? no      Handrails on the stairs?   n/a      Adequate lighting?   yes  Timed Get Up and Go Performed n/a  Cognitive Function:     6CIT Screen 02/23/2019  What Year? 0 points  What month? 0 points  What time? 0 points  Count back from 20 0 points  Months in reverse 0 points  Repeat phrase 0 points  Total Score 0    Screening Tests Health Maintenance  Topic Date Due  . FOOT EXAM  08/30/1959  . OPHTHALMOLOGY EXAM  08/30/1959  . PNA vac Low Risk Adult (1 of 2 - PCV13) 08/29/2014  . HEMOGLOBIN A1C  02/17/2019  . INFLUENZA VACCINE  02/26/2019  . MAMMOGRAM  05/24/2020  . TETANUS/TDAP  06/29/2022  . COLONOSCOPY  05/24/2024  . DEXA SCAN  Completed  . Hepatitis C Screening  Completed    Qualifies for Shingles Vaccine? yes  Cancer Screenings: Lung: Low Dose CT Chest recommended if Age 83-80 years, 30 pack-year currently smoking OR have quit w/in 15years. Patient does not qualify. Breast: Up to date on Mammogram? Yes   Up to date of Bone Density/Dexa? Yes Colorectal: up to date  Additional Screenings:  Hepatitis C Screening: 11/06/2016     Plan:    6 CIT was 0. Patient states she needs to make a dentist  appointment.  I have personally reviewed and noted the following in the patient's chart:   . Medical and social history . Use of alcohol, tobacco or illicit drugs  . Current medications and supplements . Functional ability and status . Nutritional status . Physical activity . Advanced directives . List of other physicians . Hospitalizations, surgeries, and ER visits in previous 12 months . Vitals . Screenings to include cognitive, depression, and falls . Referrals and appointments  In addition, I have reviewed and discussed with patient certain preventive protocols, quality metrics, and best practice recommendations. A written personalized care plan for preventive services as well as general preventive health recommendations were provided to patient.     Kellie Simmering, LPN   09/07/1550

## 2019-02-23 NOTE — Addendum Note (Signed)
Addended by: Kellie Simmering on: 02/23/2019 03:25 PM   Modules accepted: Orders

## 2019-02-23 NOTE — Progress Notes (Signed)
Subjective:     Patient ID: Jennifer Payne , female    DOB: [birth year] , 68 y.o.   MRN: [id number]   Chief Complaint  Patient presents with  . Annual Exam  . Diabetes  . Hypertension    HPI  She is here today for a full physical examination. She is no longer followed by GYN.  Diabetes She presents for her follow-up diabetic visit. She has type 2 diabetes mellitus. Her disease course has been stable. There are no hypoglycemic associated symptoms. Pertinent negatives for diabetes include no blurred vision and no chest pain. There are no hypoglycemic complications. Diabetic complications include nephropathy and retinopathy. Risk factors for coronary artery disease include diabetes mellitus, dyslipidemia, hypertension, obesity, sedentary lifestyle and post-menopausal. Current diabetic treatment includes insulin injections. She is compliant with treatment some of the time. She is following a diabetic diet. She participates in exercise intermittently. Her breakfast blood glucose is taken between 9-10 am. Her breakfast blood glucose range is generally 180-200 mg/dl. Eye exam is not current.  Hypertension This is a chronic problem. The current episode started more than 1 year ago. The problem has been gradually improving since onset. The problem is controlled. Pertinent negatives include no blurred vision, chest pain, palpitations or shortness of breath. The current treatment provides moderate improvement. Compliance problems include exercise.  Hypertensive end-organ damage includes retinopathy.     Past Medical History:  Diagnosis Date  . Cataract   . Diabetes mellitus   . Hyperlipidemia   . Hypertension   . Thyroid disease      Family History  Problem Relation Age of Onset  . Alzheimer's disease Mother   . Coronary artery disease Mother   . Hyperlipidemia Mother   . Obesity Mother   . Hypertension Father   . Colon cancer Neg Hx      Current Outpatient Medications:  .  Accu-Chek  FastClix Lancets MISC, USE TO CHECK BLOOD SUGAR TWICE DAILY, Disp: 300 each, Rfl: 1 .  ACCU-CHEK GUIDE test strip, USE 1 STRIP TO CHECK GLUCOSE TWICE DAILY, Disp: 300 each, Rfl: 2 .  amLODipine (NORVASC) 5 MG tablet, , Disp: , Rfl:  .  atorvastatin (LIPITOR) 80 MG tablet, Take 1 tablet by mouth once daily, Disp: 90 tablet, Rfl: 0 .  BD PEN NEEDLE NANO U/F 32G X 4 MM MISC, USE AS DIRECTED WITH LEVEMIR PEN, Disp: 100 each, Rfl: 0 .  calcitRIOL (ROCALTROL) 0.5 MCG capsule, Take 0.5 mcg by mouth daily., Disp: , Rfl:  .  clobetasol ointment (TEMOVATE) 4.69 %, Apply 1 application topically 2 (two) times daily., Disp: 30 g, Rfl: 0 .  Continuous Blood Gluc Receiver (FREESTYLE LIBRE 14 DAY READER) DEVI, Inject 1 each into the skin 4 (four) times daily. To check blood sugars dx: e11.22, Disp: 1 Device, Rfl: 1 .  Continuous Blood Gluc Sensor (FREESTYLE LIBRE 14 DAY SENSOR) MISC, Inject 1 each into the skin QID. Dx:e11.22, Disp: 3 each, Rfl: 1 .  EUTHYROX 88 MCG tablet, Take 1 tablet by mouth once daily, Disp: 30 tablet, Rfl: 0 .  hydrALAZINE (APRESOLINE) 25 MG tablet, , Disp: , Rfl:  .  LEVEMIR FLEXTOUCH 100 UNIT/ML Pen, INJECT 20 UNITS SUBCUTANEOUSLY ONCE DAILY AT BEDTIME, Disp: 6 mL, Rfl: 0 .  metolazone (ZAROXOLYN) 2.5 MG tablet, Monday, Wednesday, Friday, Disp: , Rfl:  .  metoprolol succinate (TOPROL-XL) 100 MG 24 hr tablet, TAKE 1 TABLET BY MOUTH ONCE DAILY IN THE MORNING, Disp: , Rfl:  .  torsemide (  DEMADEX) 20 MG tablet, , Disp: , Rfl:    Allergies  Allergen Reactions  . Erythromycin Rash     Review of Systems  Constitutional: Negative.   HENT: Negative.   Eyes: Negative.  Negative for blurred vision.  Respiratory: Negative.  Negative for shortness of breath.   Cardiovascular: Negative.  Negative for chest pain and palpitations.  Endocrine: Negative.   Genitourinary: Negative.   Musculoskeletal: Negative.   Skin: Negative.   Allergic/Immunologic: Negative.   Neurological: Negative.    Hematological: Negative.   Psychiatric/Behavioral: Negative.      Today's Vitals   02/23/19 1012  BP: 112/62  Pulse: 65  Temp: 97.8 F (36.6 C)  TempSrc: Oral  Weight: 206 lb 12.8 oz (93.8 kg)  Height: 5' 6.6" (1.692 m)  PainSc: 0-No pain   Body mass index is 32.78 kg/m.   Objective:  Physical Exam Vitals signs and nursing note reviewed.  Constitutional:      Appearance: Normal appearance. She is obese.  HENT:     Head: Normocephalic and atraumatic.     Right Ear: Tympanic membrane, ear canal and external ear normal.     Left Ear: Tympanic membrane, ear canal and external ear normal.     Nose: Nose normal.     Mouth/Throat:     Mouth: Mucous membranes are moist.     Pharynx: Oropharynx is clear.  Eyes:     Extraocular Movements: Extraocular movements intact.     Conjunctiva/sclera: Conjunctivae normal.     Pupils: Pupils are equal, round, and reactive to light.  Neck:     Musculoskeletal: Normal range of motion and neck supple.  Cardiovascular:     Rate and Rhythm: Normal rate and regular rhythm.     Pulses: Normal pulses.     Heart sounds: Normal heart sounds.     Comments: B/l LE edema; brawny skin b/l LE Pulmonary:     Effort: Pulmonary effort is normal.     Breath sounds: Normal breath sounds.  Chest:     Breasts: Tanner Score is 5.        Right: Normal. No swelling, bleeding, inverted nipple, mass, nipple discharge or skin change.        Left: Normal. No swelling, bleeding, inverted nipple, mass, nipple discharge or skin change.     Comments: Healed scar left breast Abdominal:     General: Bowel sounds are normal.     Palpations: Abdomen is soft.     Comments: Obese, difficult to assess organomegaly  Genitourinary:    Comments: deferred Musculoskeletal: Normal range of motion.     Right lower leg: 1+ Pitting Edema present.     Left lower leg: 1+ Pitting Edema present.  Skin:    General: Skin is warm and dry.  Neurological:     General: No focal  deficit present.     Mental Status: She is alert and oriented to person, place, and time.  Psychiatric:        Mood and Affect: Mood normal.        Behavior: Behavior normal.         Assessment And Plan:     1. Routine general medical examination at health care facility  A full exam was performed.  Importance of monthly self breast exams was discussed with the patient. PATIENT HAS BEEN ADVISED TO GET 30-45 MINUTES REGULAR EXERCISE NO LESS THAN FOUR TO FIVE DAYS PER WEEK - BOTH WEIGHTBEARING EXERCISES AND AEROBIC ARE RECOMMENDED.  SHE  IS ADVISED TO FOLLOW A HEALTHY DIET WITH AT LEAST SIX FRUITS/VEGGIES PER DAY, DECREASE INTAKE OF RED MEAT, AND TO INCREASE FISH INTAKE TO TWO DAYS PER WEEK.  MEATS/FISH SHOULD NOT BE FRIED, BAKED OR BROILED IS PREFERABLE.  I SUGGEST WEARING SPF 50 SUNSCREEN ON EXPOSED PARTS AND ESPECIALLY WHEN IN THE DIRECT SUNLIGHT FOR AN EXTENDED PERIOD OF TIME.  PLEASE AVOID FAST FOOD RESTAURANTS AND INCREASE YOUR WATER INTAKE.   2. Type 2 diabetes mellitus with end-stage renal disease (HCC)  Diabetic foot exam was performed. I DISCUSSED WITH THE PATIENT AT LENGTH REGARDING THE GOALS OF GLYCEMIC CONTROL AND POSSIBLE LONG-TERM COMPLICATIONS.  I  ALSO STRESSED THE IMPORTANCE OF COMPLIANCE WITH HOME GLUCOSE MONITORING, DIETARY RESTRICTIONS INCLUDING AVOIDANCE OF SUGARY DRINKS/PROCESSED FOODS,  ALONG WITH REGULAR EXERCISE.  I  ALSO STRESSED THE IMPORTANCE OF ANNUAL EYE EXAMS, SELF FOOT CARE AND COMPLIANCE WITH OFFICE VISITS.  - Referral to Chronic Care Management Services - CMP14+EGFR - CBC - Lipid panel - Hemoglobin A1c  3. Proliferative diabetic retinopathy associated with type 2 diabetes mellitus, unspecified laterality, unspecified proliferative retinopathy type (HCC)  I will refer her to ophthalmology for annual eye exam.   - Referral to Chronic Care Management Services  4. Hypertensive nephropathy  Chronic, well controlled. She will continue with current meds. She  is encouraged to avoid adding salt to her foods. EKG performed, no acute changes noted.   - Referral to Chronic Care Management Services  5. Primary hypothyroidism  I will check thyroid panel and adjust meds as needed.  - TSH - T4, Free  6. Muscle cramps  She is advised to take magnesium nightly. Importance of adequate hydration was discussed with the patient.   7. Peritoneal dialysis status (HCC)   8. Class 1 obesity due to excess calories with serious comorbidity and body mass index (BMI) of 32.0 to 32.9 in adult  She is congratulated on her 17 pound weight loss and encouraged to keep up the great work. Importance of achieving optimal weight to decrease risk of cardiovascular disease and cancers was discussed with the patient in full detail. She is encouraged to start slowly - start with 10 minutes twice daily at least three to four days per week and to gradually build to 30 minutes five days weekly. She was given tips to incorporate more activity into her daily routine - take stairs when possible, park farther away from grocery stores, etc.    Robyn N Sanders, MD    THE PATIENT IS ENCOURAGED TO PRACTICE SOCIAL DISTANCING DUE TO THE COVID-19 PANDEMIC.   

## 2019-02-23 NOTE — Patient Instructions (Addendum)
Jennifer Payne , Thank you for taking time to come for your Medicare Wellness Visit. I appreciate your ongoing commitment to your health goals. Please review the following plan we discussed and let me know if I can assist you in the future.   Screening recommendations/referrals: Colonoscopy: 04/2014 Mammogram: 04/2018 Bone Density: 04/2018 Recommended yearly ophthalmology/optometry visit for glaucoma screening and checkup Recommended yearly dental visit for hygiene and checkup  Vaccinations: Influenza vaccine: 04/2018 Pneumococcal vaccine: 04/2014 Tdap vaccine: 05/2013 Shingles vaccine: discussed    Advanced directives: Advance directive discussed with you today. I have provided a copy for you to complete at home and have notarized. Once this is complete please bring a copy in to our office so we can scan it into your chart.   Conditions/risks identified: obesity  Next appointment: 02/28/2020 at 9:30   Preventive Care 65 Years and Older, Female Preventive care refers to lifestyle choices and visits with your health care provider that can promote health and wellness. What does preventive care include?  A yearly physical exam. This is also called an annual well check.  Dental exams once or twice a year.  Routine eye exams. Ask your health care provider how often you should have your eyes checked.  Personal lifestyle choices, including:  Daily care of your teeth and gums.  Regular physical activity.  Eating a healthy diet.  Avoiding tobacco and drug use.  Limiting alcohol use.  Practicing safe sex.  Taking low-dose aspirin every day.  Taking vitamin and mineral supplements as recommended by your health care provider. What happens during an annual well check? The services and screenings done by your health care provider during your annual well check will depend on your age, overall health, lifestyle risk factors, and family history of disease. Counseling  Your health care  provider may ask you questions about your:  Alcohol use.  Tobacco use.  Drug use.  Emotional well-being.  Home and relationship well-being.  Sexual activity.  Eating habits.  History of falls.  Memory and ability to understand (cognition).  Work and work Statistician.  Reproductive health. Screening  You may have the following tests or measurements:  Height, weight, and BMI.  Blood pressure.  Lipid and cholesterol levels. These may be checked every 5 years, or more frequently if you are over 48 years old.  Skin check.  Lung cancer screening. You may have this screening every year starting at age 67 if you have a 30-pack-year history of smoking and currently smoke or have quit within the past 15 years.  Fecal occult blood test (FOBT) of the stool. You may have this test every year starting at age 36.  Flexible sigmoidoscopy or colonoscopy. You may have a sigmoidoscopy every 5 years or a colonoscopy every 10 years starting at age 71.  Hepatitis C blood test.  Hepatitis B blood test.  Sexually transmitted disease (STD) testing.  Diabetes screening. This is done by checking your blood sugar (glucose) after you have not eaten for a while (fasting). You may have this done every 1-3 years.  Bone density scan. This is done to screen for osteoporosis. You may have this done starting at age 32.  Mammogram. This may be done every 1-2 years. Talk to your health care provider about how often you should have regular mammograms. Talk with your health care provider about your test results, treatment options, and if necessary, the need for more tests. Vaccines  Your health care provider may recommend certain vaccines, such as:  Influenza vaccine. This is recommended every year.  Tetanus, diphtheria, and acellular pertussis (Tdap, Td) vaccine. You may need a Td booster every 10 years.  Zoster vaccine. You may need this after age 85.  Pneumococcal 13-valent conjugate (PCV13)  vaccine. One dose is recommended after age 5.  Pneumococcal polysaccharide (PPSV23) vaccine. One dose is recommended after age 14. Talk to your health care provider about which screenings and vaccines you need and how often you need them. This information is not intended to replace advice given to you by your health care provider. Make sure you discuss any questions you have with your health care provider. Document Released: 08/10/2015 Document Revised: 04/02/2016 Document Reviewed: 05/15/2015 Elsevier Interactive Patient Education  2017 [place] Prevention in the Home Falls can cause injuries. They can happen to people of all ages. There are many things you can do to make your home safe and to help prevent falls. What can I do on the outside of my home?  Regularly fix the edges of walkways and driveways and fix any cracks.  Remove anything that might make you trip as you walk through a door, such as a raised step or threshold.  Trim any bushes or trees on the path to your home.  Use bright outdoor lighting.  Clear any walking paths of anything that might make someone trip, such as rocks or tools.  Regularly check to see if handrails are loose or broken. Make sure that both sides of any steps have handrails.  Any raised decks and porches should have guardrails on the edges.  Have any leaves, snow, or ice cleared regularly.  Use sand or salt on walking paths during winter.  Clean up any spills in your garage right away. This includes oil or grease spills. What can I do in the bathroom?  Use night lights.  Install grab bars by the toilet and in the tub and shower. Do not use towel bars as grab bars.  Use non-skid mats or decals in the tub or shower.  If you need to sit down in the shower, use a plastic, non-slip stool.  Keep the floor dry. Clean up any water that spills on the floor as soon as it happens.  Remove soap buildup in the tub or shower regularly.   Attach bath mats securely with double-sided non-slip rug tape.  Do not have throw rugs and other things on the floor that can make you trip. What can I do in the bedroom?  Use night lights.  Make sure that you have a light by your bed that is easy to reach.  Do not use any sheets or blankets that are too big for your bed. They should not hang down onto the floor.  Have a firm chair that has side arms. You can use this for support while you get dressed.  Do not have throw rugs and other things on the floor that can make you trip. What can I do in the kitchen?  Clean up any spills right away.  Avoid walking on wet floors.  Keep items that you use a lot in easy-to-reach places.  If you need to reach something above you, use a strong step stool that has a grab bar.  Keep electrical cords out of the way.  Do not use floor polish or wax that makes floors slippery. If you must use wax, use non-skid floor wax.  Do not have throw rugs and other things on the floor that  can make you trip. What can I do with my stairs?  Do not leave any items on the stairs.  Make sure that there are handrails on both sides of the stairs and use them. Fix handrails that are broken or loose. Make sure that handrails are as long as the stairways.  Check any carpeting to make sure that it is firmly attached to the stairs. Fix any carpet that is loose or worn.  Avoid having throw rugs at the top or bottom of the stairs. If you do have throw rugs, attach them to the floor with carpet tape.  Make sure that you have a light switch at the top of the stairs and the bottom of the stairs. If you do not have them, ask someone to add them for you. What else can I do to help prevent falls?  Wear shoes that:  Do not have high heels.  Have rubber bottoms.  Are comfortable and fit you well.  Are closed at the toe. Do not wear sandals.  If you use a stepladder:  Make sure that it is fully opened. Do not climb  a closed stepladder.  Make sure that both sides of the stepladder are locked into place.  Ask someone to hold it for you, if possible.  Clearly mark and make sure that you can see:  Any grab bars or handrails.  First and last steps.  Where the edge of each step is.  Use tools that help you move around (mobility aids) if they are needed. These include:  Canes.  Walkers.  Scooters.  Crutches.  Turn on the lights when you go into a dark area. Replace any light bulbs as soon as they burn out.  Set up your furniture so you have a clear path. Avoid moving your furniture around.  If any of your floors are uneven, fix them.  If there are any pets around you, be aware of where they are.  Review your medicines with your doctor. Some medicines can make you feel dizzy. This can increase your chance of falling. Ask your doctor what other things that you can do to help prevent falls. This information is not intended to replace advice given to you by your health care provider. Make sure you discuss any questions you have with your health care provider. Document Released: 05/10/2009 Document Revised: 12/20/2015 Document Reviewed: 08/18/2014 Elsevier Interactive Patient Education  2017 [employer].

## 2019-02-24 LAB — CMP14+EGFR
ALT: 13 IU/L (ref 0–32)
AST: 14 IU/L (ref 0–40)
Albumin/Globulin Ratio: 1.1 — ABNORMAL LOW (ref 1.2–2.2)
Albumin: 3.6 g/dL — ABNORMAL LOW (ref 3.8–4.8)
Alkaline Phosphatase: 114 IU/L (ref 39–117)
BUN/Creatinine Ratio: 9 — ABNORMAL LOW (ref 12–28)
BUN: 73 mg/dL — ABNORMAL HIGH (ref 8–27)
Bilirubin Total: 0.3 mg/dL (ref 0.0–1.2)
CO2: 22 mmol/L (ref 20–29)
Calcium: 8.5 mg/dL — ABNORMAL LOW (ref 8.7–10.3)
Chloride: 93 mmol/L — ABNORMAL LOW (ref 96–106)
Creatinine, Ser: 8.38 mg/dL — ABNORMAL HIGH (ref 0.57–1.00)
GFR calc Af Amer: 5 mL/min/{1.73_m2} — ABNORMAL LOW (ref >59)
GFR calc non Af Amer: 4 mL/min/{1.73_m2} — ABNORMAL LOW (ref >59)
Globulin, Total: 3.2 g/dL (ref 1.5–4.5)
Glucose: 145 mg/dL — ABNORMAL HIGH (ref 65–99)
Potassium: 4.1 mmol/L (ref 3.5–5.2)
Sodium: 140 mmol/L (ref 134–144)
Total Protein: 6.8 g/dL (ref 6.0–8.5)

## 2019-02-24 LAB — CBC
Hematocrit: 39.2 % (ref 34.0–46.6)
Hemoglobin: 13.1 g/dL (ref 11.1–15.9)
MCH: 28.2 pg (ref 26.6–33.0)
MCHC: 33.4 g/dL (ref 31.5–35.7)
MCV: 84 fL (ref 79–97)
Platelets: 187 10*3/uL (ref 150–450)
RBC: 4.65 x10E6/uL (ref 3.77–5.28)
RDW: 13.3 % (ref 11.7–15.4)
WBC: 7.2 10*3/uL (ref 3.4–10.8)

## 2019-02-24 LAB — LIPID PANEL
Chol/HDL Ratio: 3.8 ratio (ref 0.0–4.4)
Cholesterol, Total: 185 mg/dL (ref 100–199)
HDL: 49 mg/dL (ref >39)
LDL Calculated: 117 mg/dL — ABNORMAL HIGH (ref 0–99)
Triglycerides: 96 mg/dL (ref 0–149)
VLDL Cholesterol Cal: 19 mg/dL (ref 5–40)

## 2019-02-24 LAB — TSH: TSH: 5.43 u[IU]/mL — ABNORMAL HIGH (ref 0.450–4.500)

## 2019-02-24 LAB — HEMOGLOBIN A1C
Est. average glucose Bld gHb Est-mCnc: 237 mg/dL
Hgb A1c MFr Bld: 9.9 % — ABNORMAL HIGH (ref 4.8–5.6)

## 2019-02-24 LAB — T4, FREE: Free T4: 1.31 ng/dL (ref 0.82–1.77)

## 2019-02-25 NOTE — Progress Notes (Signed)
Chronic Care Management   Initial Visit Note  02/23/2019 Name: Jennifer Payne MRN: [id number] DOB: 05/20/1951  Referred by: Sanders, Robyn, MD Reason for referral : Chronic Care Management   Jennifer Payne is a 68 y.o. year old female who is a primary care patient of Sanders, Robyn, MD. The CCM team was consulted for assistance with chronic disease management and care coordination needs.   Review of patient status, including review of consultants reports, relevant laboratory and other test results, and collaboration with appropriate care team members and the patient's provider was performed as part of comprehensive patient evaluation and provision of chronic care management services.    I met with Jennifer Payne face to face in clinic today  Objective:   Goals Addressed            This Visit's Progress     Patient Stated   . I would like to apply for financial assistance for my insulin (pt-stated)       Current Barriers:  . Financial Barriers  Pharmacist Clinical Goal(s):  . Over the next 30 days, patient will work with PharmD & PCP to address needs related to applying for financial assistance for insulin  Interventions: . Comprehensive medication review performed. . PD patient currently on Levemir 20 units once daily will convert to Tresiba 20 units once daily per PCP recommendation to provide longer lasting coverage. . Collaboration with [facility] CPhT, Ashley Coleman to assist with paperwork for [employer] Patient Assistance Program. . Once approved, 4-month supply of medication will ship to PCP office.  Patient Self Care Activities:  . Self administers medications as prescribed . Attends all scheduled provider appointments . Calls pharmacy for medication refills . Performs ADL's independently  Initial goal documentation     . Patient Stated (pt-stated)       Current Barriers:  . Diabetes: T2DM; most recent A1c 9.9% on 02/23/19.  A1c has increased since 05/2018 when it  was 7.4 . Current antihyperglycemic regimen: Levemir 20 units (will be switching to Tresiba); AccuCheck glucometer . Denies hypoglycemic symptoms; Denies hyperglycemic symptoms . Current exercise: walking as able . Current blood glucose readings: FBG 150-200 . Cardiovascular risk reduction: o Current hypertensive/CHF regimen: amlodipine 5mg, metoprolol XL 100mg, hydralazine 25mg TID; torsemide, metolazone o Current hyperlipidemia regimen: atorvastatin 80mg daily  Pharmacist Clinical Goal(s):  . Over the next 90 days, patient with work with PharmD and primary care provider to address optimization of medication management of chronic conditions  Interventions: Face to face clinic visit completed with Jennifer Payne on 02/23/19 . Comprehensive medication review performed.  Reviewed medication fill history via insurance claims data confirming patient appears compliant with having his medications filled on time as prescribed by provider. . Reviewed & discussed the following diabetes-related information with patient: o Continue checking blood sugars as directed o Follow ADA recommended "diabetes-friendly" diet  (reviewed healthy snack/food options) o Discussed insulin injection technique o Reviewed medication purpose/side effects o Encouraged patient to continue taking all medications as prescribed by provider   Patient Self Care Activities:  . Patient will check blood glucose daily in the AM , document, and provide at future appointments . Patient will focus on medication adherence by continuing to take all medications as prescribed; blood sugars at goal  . Patient will take medications as prescribed . Patient will contact provider with any episodes of hypoglycemia . Patient will report any questions or concerns to provider   Initial goal documentation           Jennifer Payne was given information about Chronic Care Management services today including:  1. CCM service includes personalized  support from designated clinical staff supervised by her physician, including individualized plan of care and coordination with other care providers 2. 24/7 contact phone numbers for assistance for urgent and routine care needs. 3. Service will only be billed when office clinical staff spend 20 minutes or more in a month to coordinate care. 4. Only one practitioner may furnish and bill the service in a calendar month. 5. The patient may stop CCM services at any time (effective at the end of the month) by phone call to the office staff. 6. The patient will be responsible for cost sharing (co-pay) of up to 20% of the service fee (after annual deductible is met).  Patient agreed to services and verbal consent obtained.   Plan:   The care management team will reach out to the patient again over the next 2 weeks.  Julie Dattero Pruitt, PharmD, BCPS Clinical Pharmacist, [facility] [place]  [facility]  Direct Dial: [phone]     

## 2019-02-26 DIAGNOSIS — D631 Anemia in chronic kidney disease: Secondary | ICD-10-CM | POA: Diagnosis not present

## 2019-02-26 DIAGNOSIS — D509 Iron deficiency anemia, unspecified: Secondary | ICD-10-CM | POA: Diagnosis not present

## 2019-02-26 DIAGNOSIS — Z23 Encounter for immunization: Secondary | ICD-10-CM | POA: Diagnosis not present

## 2019-02-26 DIAGNOSIS — N2581 Secondary hyperparathyroidism of renal origin: Secondary | ICD-10-CM | POA: Diagnosis not present

## 2019-02-26 DIAGNOSIS — I10 Essential (primary) hypertension: Secondary | ICD-10-CM | POA: Diagnosis not present

## 2019-02-26 DIAGNOSIS — N186 End stage renal disease: Secondary | ICD-10-CM | POA: Diagnosis not present

## 2019-02-27 ENCOUNTER — Encounter: Payer: Self-pay | Admitting: Internal Medicine

## 2019-02-28 NOTE — Patient Instructions (Signed)
Visit Information  Goals Addressed            This Visit's Progress     Patient Stated   . I would like to apply for financial assistance for my insulin (pt-stated)       Current Barriers:  . Financial Barriers  Pharmacist Clinical Goal(s):  Marland Kitchen Over the next 30 days, patient will work with PharmD & PCP to address needs related to applying for financial assistance for insulin  Interventions: . Comprehensive medication review performed. . PD patient currently on Levemir 20 units once daily will convert to [place] 20 units once daily per PCP recommendation to provide longer lasting coverage. . Collaboration with [facility] CPhT, Etter Sjogren to assist with paperwork for [employer] Patient Assistance Program. . Once approved, 60-month supply of medication will ship to PCP office.  Patient Self Care Activities:  . Self administers medications as prescribed . Attends all scheduled provider appointments . Calls pharmacy for medication refills . Performs ADL's independently  Initial goal documentation     . Patient Stated (pt-stated)       Current Barriers:  . Diabetes: T2DM; most recent A1c 9.9% on 02/23/19.  A1c has increased since 05/2018 when it was 7.4 . Current antihyperglycemic regimen: Levemir 20 units (will be switching to [place]); AccuCheck glucometer . Denies hypoglycemic symptoms; Denies hyperglycemic symptoms . Current exercise: walking as able . Current blood glucose readings: FBG 150-200 . Cardiovascular risk reduction: o Current hypertensive/CHF regimen: amlodipine 5mg , metoprolol XL 100mg , hydralazine 25mg  TID; torsemide, metolazone o Current hyperlipidemia regimen: atorvastatin 80mg  daily  Pharmacist Clinical Goal(s):  Marland Kitchen Over the next 90 days, patient with work with PharmD and primary care provider to address optimization of medication management of chronic conditions  Interventions: Face to face clinic visit completed with Ms. Namba on 02/23/19 . Comprehensive  medication review performed.  Reviewed medication fill history via insurance claims data confirming patient appears compliant with having his medications filled on time as prescribed by provider. . Reviewed & discussed the following diabetes-related information with patient: o Continue checking blood sugars as directed o Follow ADA recommended "diabetes-friendly" diet  (reviewed healthy snack/food options) o Discussed insulin injection technique o Reviewed medication purpose/side effects o Encouraged patient to continue taking all medications as prescribed by provider   Patient Self Care Activities:  . Patient will check blood glucose daily in the AM , document, and provide at future appointments . Patient will focus on medication adherence by continuing to take all medications as prescribed; blood sugars at goal  . Patient will take medications as prescribed . Patient will contact provider with any episodes of hypoglycemia . Patient will report any questions or concerns to provider   Initial goal documentation        The patient verbalized understanding of instructions provided today and declined a print copy of patient instruction materials.   The care management team will reach out to the patient again over the next 2 weeks.  Regina Eck, PharmD, BCPS Clinical Pharmacist, [facility] [place]: [phone]

## 2019-03-01 ENCOUNTER — Ambulatory Visit: Payer: Self-pay | Admitting: Pharmacist

## 2019-03-01 DIAGNOSIS — Z992 Dependence on renal dialysis: Secondary | ICD-10-CM

## 2019-03-01 DIAGNOSIS — E1122 Type 2 diabetes mellitus with diabetic chronic kidney disease: Secondary | ICD-10-CM

## 2019-03-01 DIAGNOSIS — N186 End stage renal disease: Secondary | ICD-10-CM

## 2019-03-01 DIAGNOSIS — I129 Hypertensive chronic kidney disease with stage 1 through stage 4 chronic kidney disease, or unspecified chronic kidney disease: Secondary | ICD-10-CM

## 2019-03-01 NOTE — Progress Notes (Signed)
  Chronic Care Management   Outreach Note  03/01/2019 Name: Jennifer Payne MRN: [id number] DOB: [birth year]  Referred by: Glendale Chard, MD Reason for referral : Chronic Care Management   An unsuccessful telephone outreach was attempted today. The patient was referred to the case management team by for assistance with chronic care management and care coordination.  Patient had called to ask about transition to [place] as she is currently on Levemir.  Currently assisting patient with diabetes medication assistance and adherence.  Follow Up Plan: A HIPPA compliant phone message was left for the patient providing contact information and requesting a return call.  The care management team will reach out to the patient again over the next 3-5 business days.    Regina Eck, PharmD, BCPS Clinical Pharmacist, [facility] [place]: [phone]

## 2019-03-02 DIAGNOSIS — E119 Type 2 diabetes mellitus without complications: Secondary | ICD-10-CM | POA: Diagnosis not present

## 2019-03-04 ENCOUNTER — Encounter: Payer: Self-pay | Admitting: Internal Medicine

## 2019-03-07 ENCOUNTER — Ambulatory Visit: Payer: Self-pay | Admitting: Pharmacist

## 2019-03-07 DIAGNOSIS — N186 End stage renal disease: Secondary | ICD-10-CM

## 2019-03-07 DIAGNOSIS — I129 Hypertensive chronic kidney disease with stage 1 through stage 4 chronic kidney disease, or unspecified chronic kidney disease: Secondary | ICD-10-CM

## 2019-03-07 DIAGNOSIS — Z992 Dependence on renal dialysis: Secondary | ICD-10-CM

## 2019-03-07 DIAGNOSIS — E1122 Type 2 diabetes mellitus with diabetic chronic kidney disease: Secondary | ICD-10-CM

## 2019-03-08 ENCOUNTER — Telehealth: Payer: Self-pay

## 2019-03-09 NOTE — Patient Instructions (Addendum)
Visit Information  Goals Addressed            This Visit's Progress     Patient Stated   . I would like to control my diabetes (pt-stated)       Current Barriers:  . Diabetes: T2DM; most recent A1c 9.9% on 02/23/19 (Increase from 7.4% on 08/19/2018) . Current antihyperglycemic regimen: Levemir 20 units qHS (Goal is to transition to [place] 20 units qHS for better/optimal glucose control, patient is hesitant to switch due to concerns after reading Tresiba's package insert.   Reassured patient that Tyler Aas is safe and effective if taken as prescribed by her MD.  Discussed negative outcomes when blood sugar is not controlled.  Encouraged patient to continue to think about the new insulin and I will reach out to her next week. . denies hypoglycemic symptoms, denies hyperglycemic symptoms . Current meal patterns: increased carb intake . Current exercise: walking . Current blood glucose readings: reported FBG 125, does not check daily . Cardiovascular risk reduction: o Current hypertensive regimen: amlodipine, metoprolol, hydralazine o Current hyperlipidemia regimen: atorvastatin 80mg  daily (last filled 02/23/19 for #90)  Pharmacist Clinical Goal(s):  Marland Kitchen Over the next 90 days, patient with work with PharmD and primary care provider to address optimization of medication management of chronic conditions.  Interventions: . Comprehensive medication review performed, medication list updated in electronic medical record.  Reviewed medication fill history via insurance claims data confirming patient appears compliant with having his medications filled on time as prescribed by provider. . Reviewed & discussed the following diabetes-related information with patient: o Continue checking blood sugars as directed o Follow ADA recommended "diabetes-friendly" diet  (reviewed healthy snack/food options) o Discussed insulin technique; Patient uses AccuCheck glucometer o Reviewed medication purpose/side  effects-->patient denies adverse events o Continue taking all medications as prescribed by provider  Patient Self Care Activities:  . Patient will check blood glucose daily, document, and provide at future appointments . Patient will focus on medication adherence by taking insulin as prescribed and participating in  . Patient will take medications as prescribed . Patient will contact provider with any episodes of hypoglycemia . Patient will report any questions or concerns to provider   Initial goal documentation        The patient verbalized understanding of instructions provided today and declined a print copy of patient instruction materials.   The care management team will reach out to the patient again over the next week.  Regina Eck, PharmD, BCPS Clinical Pharmacist, [facility] [place]: [phone]

## 2019-03-09 NOTE — Progress Notes (Signed)
  Chronic Care Management   Visit Note  03/07/2019 Name: Jennifer Payne MRN: [id number] DOB: 06/19/1951  Referred by: Glendale Chard, MD Reason for referral : Chronic Care Management   Jennifer Payne is a 68 y.o. year old female who is a primary care patient of Glendale Chard, MD. The CCM team was consulted for assistance with chronic disease management and care coordination needs.   Review of patient status, including review of consultants reports, relevant laboratory and other test results, and collaboration with appropriate care team members and the patient's provider was performed as part of comprehensive patient evaluation and provision of chronic care management services.    I spoke with Jennifer Payne by telephone today.  Objective:   Goals Addressed            This Visit's Progress     Patient Stated   . I would like to control my diabetes (pt-stated)       Current Barriers:  . Diabetes: T2DM; most recent A1c 9.9% on 02/23/19 (Increase from 7.4% on 08/19/2018) . Current antihyperglycemic regimen: Levemir 20 units qHS (Goal is to transition to [place] 20 units qHS for better/optimal glucose control, patient is hesitant to switch due to concerns after reading Tresiba's package insert.   Reassured patient that Tyler Aas is safe and effective if taken as prescribed by her MD.  Discussed negative outcomes when blood sugar is not controlled.  Encouraged patient to continue to think about the new insulin and I will reach out to her next week. . denies hypoglycemic symptoms, denies hyperglycemic symptoms . Current meal patterns: increased carb intake . Current exercise: walking . Current blood glucose readings: reported FBG 125, does not check daily . Cardiovascular risk reduction: o Current hypertensive regimen: amlodipine, metoprolol, hydralazine o Current hyperlipidemia regimen: atorvastatin 80mg  daily (last filled 02/23/19 for #90)  Pharmacist Clinical Goal(s):  Marland Kitchen Over the next 90  days, patient with work with PharmD and primary care provider to address optimization of medication management of chronic conditions.  Interventions: . Comprehensive medication review performed, medication list updated in electronic medical record.  Reviewed medication fill history via insurance claims data confirming patient appears compliant with having his medications filled on time as prescribed by provider. . Reviewed & discussed the following diabetes-related information with patient: o Continue checking blood sugars as directed o Follow ADA recommended "diabetes-friendly" diet  (reviewed healthy snack/food options) o Discussed insulin technique; Patient uses AccuCheck glucometer o Reviewed medication purpose/side effects-->patient denies adverse events o Continue taking all medications as prescribed by provider  Patient Self Care Activities:  . Patient will check blood glucose daily, document, and provide at future appointments . Patient will focus on medication adherence by taking insulin as prescribed and participating in  . Patient will take medications as prescribed . Patient will contact provider with any episodes of hypoglycemia . Patient will report any questions or concerns to provider   Initial goal documentation         Plan:   The care management team will reach out to the patient again over the next 7 days.   Regina Eck, PharmD, BCPS Clinical Pharmacist, [facility] [place]: [phone]

## 2019-03-14 DIAGNOSIS — Z4932 Encounter for adequacy testing for peritoneal dialysis: Secondary | ICD-10-CM | POA: Diagnosis not present

## 2019-03-15 ENCOUNTER — Telehealth: Payer: Self-pay

## 2019-03-17 ENCOUNTER — Ambulatory Visit: Payer: Self-pay | Admitting: Pharmacist

## 2019-03-17 DIAGNOSIS — Z992 Dependence on renal dialysis: Secondary | ICD-10-CM

## 2019-03-17 DIAGNOSIS — E1122 Type 2 diabetes mellitus with diabetic chronic kidney disease: Secondary | ICD-10-CM

## 2019-03-17 DIAGNOSIS — N186 End stage renal disease: Secondary | ICD-10-CM

## 2019-03-17 DIAGNOSIS — I129 Hypertensive chronic kidney disease with stage 1 through stage 4 chronic kidney disease, or unspecified chronic kidney disease: Secondary | ICD-10-CM

## 2019-03-22 NOTE — Patient Instructions (Addendum)
Visit Information  Goals Addressed            This Visit's Progress     Patient Stated   . I would like to apply for financial assistance for my insulin (pt-stated)       Current Barriers:  . Financial Barriers  Pharmacist Clinical Goal(s):  Marland Kitchen Over the next 30 days, patient will work with PharmD & PCP to address needs related to applying for financial assistance for insulin  Interventions: . Comprehensive medication review performed. . PD patient currently on Levemir 20 units once daily will attempt to convert to [place] 20 units once daily per PCP recommendation to provide longer lasting coverage.  Patient not willing to transition to [place] at this time.  Will discuss with PCP and likely continue Levemir per patient preference.  Would be able to switch to [place] at any time throughout the end of the year if patient agreeable.  Discussed with patient that  Tyler Aas was a safe medication if taken as prescribed and monitored.  Will continue to encourage and inform patient of therapeutic alternatives. . Collaboration with [employer] CPhT, Etter Sjogren to assist with paperwork for [employer] Patient Assistance Program. . Patient financials received.  Application to be submitted once RX changed back to Levemir. . Once approved, 68-month supply of medication will ship to PCP office.  Patient Self Care Activities:  . Self administers medications as prescribed . Attends all scheduled provider appointments . Calls pharmacy for medication refills . Performs ADL's independently  Please see past updates related to this goal by clicking on the "Past Updates" button in the selected goal      . I would like to control my diabetes (pt-stated)       Current Barriers:  . Diabetes: T2DM; most recent A1c 9.9% on 02/23/19 (Increase from 7.4% on 08/19/2018) . Current antihyperglycemic regimen: Levemir 20 units qHS (Goal is to transition to [place] 20 units qHS for better/optimal glucose control, patient is  hesitant to switch due to concerns after reading Tresiba's package insert.   Reassured patient that Tyler Aas is safe and effective if taken as prescribed by her MD.  Discussed negative outcomes occur when blood sugar is not controlled.  Encouraged patient to continue to think about the new insulin and I will reach out to her next week. . denies hypoglycemic symptoms, denies hyperglycemic symptoms . Current meal patterns: increased carb intake . Current exercise: walking . Current blood glucose readings: reported FBG 125, does not check daily . Cardiovascular risk reduction: o Current hypertensive regimen: amlodipine, metoprolol, hydralazine o Current hyperlipidemia regimen: atorvastatin 80mg  daily (last filled 02/23/19 for #90)  Pharmacist Clinical Goal(s):  Marland Kitchen Over the next 90 days, patient with work with PharmD and primary care provider to address optimization of medication management of chronic conditions.  Interventions: . Comprehensive medication review performed, medication list updated in electronic medical record.  Reviewed medication fill history via insurance claims data confirming patient appears compliant with having his medications filled on time as prescribed by provider. . Reviewed & discussed the following diabetes-related information with patient: o Continue checking blood sugars as directed o Follow ADA recommended "diabetes-friendly" diet  (reviewed healthy snack/food options) o Discussed insulin technique; Patient uses AccuCheck glucometer o Reviewed medication purpose/side effects-->patient denies adverse events o Continue taking all medications as prescribed by provider  Patient Self Care Activities:  . Patient will check blood glucose daily, document, and provide at future appointments . Patient will focus on medication adherence by taking  insulin as prescribed and participating in  . Patient will take medications as prescribed . Patient will contact provider with any  episodes of hypoglycemia . Patient will report any questions or concerns to provider   Please see past updates related to this goal by clicking on the "Past Updates" button in the selected goal         The patient verbalized understanding of instructions provided today and declined a print copy of patient instruction materials.   The care management team will reach out to the patient again over the next 4 weeks.  Regina Eck, PharmD, BCPS Clinical Pharmacist, [facility] [place]: [phone]

## 2019-03-22 NOTE — Progress Notes (Signed)
Chronic Care Management   Visit Note  03/17/2019 Name: Jennifer Payne MRN: [id number] DOB: 1950/12/08  Referred by: Glendale Chard, MD Reason for referral : Chronic Care Management   Jennifer Payne is a 69 y.o. year old female who is a primary care patient of Glendale Chard, MD. The CCM team was consulted for assistance with chronic disease management and care coordination needs.   Review of patient status, including review of consultants reports, relevant laboratory and other test results, and collaboration with appropriate care team members and the patient's provider was performed as part of comprehensive patient evaluation and provision of chronic care management services.    I spoke with Mr. Venning by telephone today.   Medications: Outpatient Encounter Medications as of 03/17/2019  Medication Sig  . Accu-Chek FastClix Lancets MISC USE TO CHECK BLOOD SUGAR TWICE DAILY  . ACCU-CHEK GUIDE test strip USE 1 STRIP TO CHECK GLUCOSE TWICE DAILY  . amLODipine (NORVASC) 5 MG tablet   . atorvastatin (LIPITOR) 80 MG tablet Take 1 tablet by mouth once daily  . BD PEN NEEDLE NANO U/F 32G X 4 MM MISC USE AS DIRECTED WITH LEVEMIR PEN  . calcitRIOL (ROCALTROL) 0.5 MCG capsule Take 0.5 mcg by mouth daily.  . clobetasol ointment (TEMOVATE) 5.39 % Apply 1 application topically 2 (two) times daily.  . Continuous Blood Gluc Receiver (FREESTYLE LIBRE 14 DAY READER) DEVI Inject 1 each into the skin 4 (four) times daily. To check blood sugars dx: e11.22  . Continuous Blood Gluc Sensor (FREESTYLE LIBRE 14 DAY SENSOR) MISC Inject 1 each into the skin QID. Dx:e11.22  . EUTHYROX 88 MCG tablet Take 1 tablet by mouth once daily  . hydrALAZINE (APRESOLINE) 25 MG tablet   . LEVEMIR FLEXTOUCH 100 UNIT/ML Pen INJECT 20 UNITS SUBCUTANEOUSLY ONCE DAILY AT BEDTIME  . metolazone (ZAROXOLYN) 2.5 MG tablet Monday, Wednesday, Friday  . metoprolol succinate (TOPROL-XL) 100 MG 24 hr tablet TAKE 1 TABLET BY MOUTH ONCE DAILY  IN THE MORNING  . torsemide (DEMADEX) 20 MG tablet    No facility-administered encounter medications on file as of 03/17/2019.      Objective:   Goals Addressed            This Visit's Progress     Patient Stated   . I would like to apply for financial assistance for my insulin (pt-stated)       Current Barriers:  . Financial Barriers  Pharmacist Clinical Goal(s):  Marland Kitchen Over the next 30 days, patient will work with PharmD & PCP to address needs related to applying for financial assistance for insulin  Interventions: . Comprehensive medication review performed. . PD patient currently on Levemir 20 units once daily will attempt to convert to [place] 20 units once daily per PCP recommendation to provide longer lasting coverage.  Patient not willing to transition to [place] at this time.  Will discuss with PCP and likely continue Levemir per patient preference.  Would be able to switch to [place] at any time throughout the end of the year if patient agreeable.  Discussed with patient that  Tyler Aas was a safe medication if taken as prescribed and monitored.  Will continue to encourage and inform patient of therapeutic alternatives. . Collaboration with [employer] CPhT, Etter Sjogren to assist with paperwork for [employer] Patient Assistance Program. . Patient financials received.  Application to be submitted once RX changed back to Levemir. . Once approved, 75-month supply of medication will ship to PCP office.  Patient  Self Care Activities:  . Self administers medications as prescribed . Attends all scheduled provider appointments . Calls pharmacy for medication refills . Performs ADL's independently  Please see past updates related to this goal by clicking on the "Past Updates" button in the selected goal      . I would like to control my diabetes (pt-stated)       Current Barriers:  . Diabetes: T2DM; most recent A1c 9.9% on 02/23/19 (Increase from 7.4% on 08/19/2018) . Current  antihyperglycemic regimen: Levemir 20 units qHS (Goal is to transition to [place] 20 units qHS for better/optimal glucose control, patient is hesitant to switch due to concerns after reading Tresiba's package insert.   Reassured patient that Tyler Aas is safe and effective if taken as prescribed by her MD.  Discussed negative outcomes occur when blood sugar is not controlled.  Encouraged patient to continue to think about the new insulin and I will reach out to her next week. . denies hypoglycemic symptoms, denies hyperglycemic symptoms . Current meal patterns: increased carb intake . Current exercise: walking . Current blood glucose readings: reported FBG 125, does not check daily . Cardiovascular risk reduction: o Current hypertensive regimen: amlodipine, metoprolol, hydralazine o Current hyperlipidemia regimen: atorvastatin 80mg  daily (last filled 02/23/19 for #90)  Pharmacist Clinical Goal(s):  Marland Kitchen Over the next 90 days, patient with work with PharmD and primary care provider to address optimization of medication management of chronic conditions.  Interventions: . Comprehensive medication review performed, medication list updated in electronic medical record.  Reviewed medication fill history via insurance claims data confirming patient appears compliant with having his medications filled on time as prescribed by provider. . Reviewed & discussed the following diabetes-related information with patient: o Continue checking blood sugars as directed o Follow ADA recommended "diabetes-friendly" diet  (reviewed healthy snack/food options) o Discussed insulin technique; Patient uses AccuCheck glucometer o Reviewed medication purpose/side effects-->patient denies adverse events o Continue taking all medications as prescribed by provider  Patient Self Care Activities:  . Patient will check blood glucose daily, document, and provide at future appointments . Patient will focus on medication adherence by  taking insulin as prescribed and participating in  . Patient will take medications as prescribed . Patient will contact provider with any episodes of hypoglycemia . Patient will report any questions or concerns to provider   Please see past updates related to this goal by clicking on the "Past Updates" button in the selected goal          Plan:   The care management team will reach out to the patient again over the next 4 weeks.  Regina Eck, PharmD, BCPS Clinical Pharmacist, [facility] [place]: [phone]

## 2019-03-24 ENCOUNTER — Other Ambulatory Visit: Payer: Self-pay | Admitting: Pharmacy Technician

## 2019-03-24 NOTE — Patient Outreach (Signed)
[facility]) Care Management  03/24/2019  KALYA TROEGER Jun 10, 1951 [id number]                                       Medication Assistance Referral  Referral From: [place]. ([place])  Medication/Company: [place] / [employer] Patient application portion:  N/A RPh Jenne Pane Provider application portion:  N/A RPh Jenne Pane  Provider address/fax verified via:  N/A Sharyl Nimrod  Faxed completed application and required documents into [employer]  Follow up:  Will follow up with company in 3-5 business days to check application status  Maud Deed. Chana Bode [place] Certified Pharmacy Technician [place] Management Direct Dial:[phone]

## 2019-03-25 ENCOUNTER — Ambulatory Visit (INDEPENDENT_AMBULATORY_CARE_PROVIDER_SITE_OTHER): Payer: Medicare HMO | Admitting: Pharmacist

## 2019-03-25 DIAGNOSIS — E1122 Type 2 diabetes mellitus with diabetic chronic kidney disease: Secondary | ICD-10-CM

## 2019-03-25 DIAGNOSIS — N186 End stage renal disease: Secondary | ICD-10-CM | POA: Diagnosis not present

## 2019-03-25 DIAGNOSIS — Z992 Dependence on renal dialysis: Secondary | ICD-10-CM

## 2019-03-25 NOTE — Progress Notes (Signed)
Chronic Care Management   Visit Note  03/25/2019 Name: Jennifer Payne MRN: [id number] DOB: June 30, 1951  Referred by: Glendale Chard, MD Reason for referral : Chronic Care Management   Jennifer Payne is a 68 y.o. year old female who is a primary care patient of Glendale Chard, MD. The CCM team was consulted for assistance with chronic disease management and care coordination needs.   Review of patient status, including review of consultants reports, relevant laboratory and other test results, and collaboration with appropriate care team members and the patient's provider was performed as part of comprehensive patient evaluation and provision of chronic care management services.    I spoke with Jennifer Payne by telephone today.  Medications: Outpatient Encounter Medications as of 03/25/2019  Medication Sig  . Accu-Chek FastClix Lancets MISC USE TO CHECK BLOOD SUGAR TWICE DAILY  . ACCU-CHEK GUIDE test strip USE 1 STRIP TO CHECK GLUCOSE TWICE DAILY  . amLODipine (NORVASC) 5 MG tablet   . atorvastatin (LIPITOR) 80 MG tablet Take 1 tablet by mouth once daily  . BD PEN NEEDLE NANO U/F 32G X 4 MM MISC USE AS DIRECTED WITH LEVEMIR PEN  . calcitRIOL (ROCALTROL) 0.5 MCG capsule Take 0.5 mcg by mouth daily.  . clobetasol ointment (TEMOVATE) 6.81 % Apply 1 application topically 2 (two) times daily.  . Continuous Blood Gluc Receiver (FREESTYLE LIBRE 14 DAY READER) DEVI Inject 1 each into the skin 4 (four) times daily. To check blood sugars dx: e11.22  . Continuous Blood Gluc Sensor (FREESTYLE LIBRE 14 DAY SENSOR) MISC Inject 1 each into the skin QID. Dx:e11.22  . EUTHYROX 88 MCG tablet Take 1 tablet by mouth once daily  . hydrALAZINE (APRESOLINE) 25 MG tablet   . LEVEMIR FLEXTOUCH 100 UNIT/ML Pen INJECT 20 UNITS SUBCUTANEOUSLY ONCE DAILY AT BEDTIME  . metolazone (ZAROXOLYN) 2.5 MG tablet Monday, Wednesday, Friday  . metoprolol succinate (TOPROL-XL) 100 MG 24 hr tablet TAKE 1 TABLET BY MOUTH ONCE DAILY  IN THE MORNING  . torsemide (DEMADEX) 20 MG tablet    No facility-administered encounter medications on file as of 03/25/2019.      Objective:   Goals Addressed            This Visit's Progress     Patient Stated   . I would like to apply for financial assistance for my insulin (pt-stated)       Current Barriers:  . Financial Barriers  Pharmacist Clinical Goal(s):  Marland Kitchen Over the next 30 days, patient will work with PharmD & PCP to address needs related to applying for financial assistance for insulin  Interventions: . Comprehensive medication review performed. . PD patient currently on Levemir 20 units once daily will attempt to convert to [place] 20 units once daily per PCP recommendation to provide longer lasting coverage.  Patient not willing to transition to [place] at this time.  Will discuss with PCP and likely continue Levemir per patient preference.  Would be able to switch to [place] at any time throughout the end of the year if patient agreeable.  Discussed with patient that  Tyler Aas was a safe medication if taken as prescribed and monitored.  Will continue to encourage and inform patient of therapeutic alternatives. . Collaboration with [employer] CPhT, Etter Sjogren to assist with paperwork for [employer] Patient Assistance Program. . Patient financials received.  Application to be submitted.  Company requesting additional information to be provided by patient.  Will re-fax when received. . Once approved, 86-month supply of  medication will ship to PCP office.  Patient Self Care Activities:  . Self administers medications as prescribed . Attends all scheduled provider appointments . Calls pharmacy for medication refills . Performs ADL's independently  Please see past updates related to this goal by clicking on the "Past Updates" button in the selected goal      . I would like to control my diabetes (pt-stated)       Current Barriers:  . Diabetes: T2DM; most recent A1c 9.9%  on 02/23/19 (Increase from 7.4% on 08/19/2018) . Current antihyperglycemic regimen: Levemir 20 units qHS (Goal is to transition to [place] 20 units qHS for better/optimal glucose control, patient is hesitant to switch due to concerns after reading Tresiba's package insert.   Reassured patient that Tyler Aas is safe and effective if taken as prescribed by her MD.  Discussed negative outcomes occur when blood sugar is not controlled.  Encouraged patient to continue to think about the new insulin and I will reach out to her next week. . denies hypoglycemic symptoms, denies hyperglycemic symptoms . Current meal patterns: increased carb intake . Current exercise: walking . Current blood glucose readings: reported FBG 120, does not check daily, encouraged patient to check daily . Cardiovascular risk reduction: o Current hypertensive regimen: amlodipine, metoprolol, hydralazine o Current hyperlipidemia regimen: atorvastatin 80mg  daily (last filled 02/23/19 for #90)  Pharmacist Clinical Goal(s):  Marland Kitchen Over the next 90 days, patient with work with PharmD and primary care provider to address optimization of medication management of chronic conditions.  Interventions: . Comprehensive medication review performed, medication list updated in electronic medical record.  Reviewed medication fill history via insurance claims data confirming patient appears compliant with having his medications filled on time as prescribed by provider. . Reviewed & discussed the following diabetes-related information with patient: o Continue checking blood sugars as directed o Follow ADA recommended "diabetes-friendly" diet  (reviewed healthy snack/food options) o Discussed insulin technique; Patient uses AccuCheck glucometer o Reviewed medication purpose/side effects-->patient denies adverse events o Continue taking all medications as prescribed by provider  Patient Self Care Activities:  . Patient will check blood glucose daily,  document, and provide at future appointments . Patient will focus on medication adherence by taking insulin as prescribed and participating in  . Patient will take medications as prescribed . Patient will contact provider with any episodes of hypoglycemia . Patient will report any questions or concerns to provider   Please see past updates related to this goal by clicking on the "Past Updates" button in the selected goal          Plan:   The care management team will reach out to the patient again over the next 4 weeks.  Regina Eck, PharmD, BCPS Clinical Pharmacist, [facility] [place]: [phone]

## 2019-03-25 NOTE — Patient Instructions (Signed)
Visit Information  Goals Addressed            This Visit's Progress     Patient Stated   . I would like to apply for financial assistance for my insulin (pt-stated)       Current Barriers:  . Financial Barriers  Pharmacist Clinical Goal(s):  Marland Kitchen Over the next 30 days, patient will work with PharmD & PCP to address needs related to applying for financial assistance for insulin  Interventions: . Comprehensive medication review performed. . PD patient currently on Levemir 20 units once daily will attempt to convert to [place] 20 units once daily per PCP recommendation to provide longer lasting coverage.  Patient not willing to transition to [place] at this time.  Will discuss with PCP and likely continue Levemir per patient preference.  Would be able to switch to [place] at any time throughout the end of the year if patient agreeable.  Discussed with patient that  Tyler Aas was a safe medication if taken as prescribed and monitored.  Will continue to encourage and inform patient of therapeutic alternatives. . Collaboration with [facility] C[facility]T, Etter Sjogren to assist with paperwork for [employer] Patient Assistance Program. . Patient financials received.  Application to be submitted.  Company requesting additional information to be provided by patient.  Will re-fax when received. . Once approved, 44-month supply of medication will ship to PCP office.  Patient Self Care Activities:  . Self administers medications as prescribed . Attends all scheduled provider appointments . Calls pharmacy for medication refills . Performs ADL's independently  Please see past updates related to this goal by clicking on the "Past Updates" button in the selected goal      . I would like to control my diabetes (pt-stated)       Current Barriers:  . Diabetes: T2DM; most recent A1c 9.9% on 02/23/19 (Increase from 7.4% on 08/19/2018) . Current antihyperglycemic regimen: Levemir 20 units qHS (Goal is to transition to  [place] 20 units qHS for better/optimal glucose control, patient is hesitant to switch due to concerns after reading Tresiba's package insert.   Reassured patient that Tyler Aas is safe and effective if taken as prescribed by her MD.  Discussed negative outcomes occur when blood sugar is not controlled.  Encouraged patient to continue to think about the new insulin and I will reach out to her next week. . denies hypoglycemic symptoms, denies hyperglycemic symptoms . Current meal patterns: increased carb intake . Current exercise: walking . Current blood glucose readings: reported FBG 120, does not check daily, encouraged patient to check daily . Cardiovascular risk reduction: o Current hypertensive regimen: amlodipine, metoprolol, hydralazine o Current hyperlipidemia regimen: atorvastatin 80mg  daily (last filled 02/23/19 for #90)  Pharmacist Clinical Goal(s):  Marland Kitchen Over the next 90 days, patient with work with PharmD and primary care provider to address optimization of medication management of chronic conditions.  Interventions: . Comprehensive medication review performed, medication list updated in electronic medical record.  Reviewed medication fill history via insurance claims data confirming patient appears compliant with having his medications filled on time as prescribed by provider. . Reviewed & discussed the following diabetes-related information with patient: o Continue checking blood sugars as directed o Follow ADA recommended "diabetes-friendly" diet  (reviewed healthy snack/food options) o Discussed insulin technique; Patient uses AccuCheck glucometer o Reviewed medication purpose/side effects-->patient denies adverse events o Continue taking all medications as prescribed by provider  Patient Self Care Activities:  . Patient will check blood glucose daily, document,  and provide at future appointments . Patient will focus on medication adherence by taking insulin as prescribed and  participating in  . Patient will take medications as prescribed . Patient will contact provider with any episodes of hypoglycemia . Patient will report any questions or concerns to provider   Please see past updates related to this goal by clicking on the "Past Updates" button in the selected goal         The patient verbalized understanding of instructions provided today and declined a print copy of patient instruction materials.   The care management team will reach out to the patient again over the next 4 weeks.  Regina Eck, PharmD, BCPS Clinical Pharmacist, [facility] [place]: [phone]

## 2019-03-29 DIAGNOSIS — D509 Iron deficiency anemia, unspecified: Secondary | ICD-10-CM | POA: Diagnosis not present

## 2019-03-29 DIAGNOSIS — D631 Anemia in chronic kidney disease: Secondary | ICD-10-CM | POA: Diagnosis not present

## 2019-03-29 DIAGNOSIS — N186 End stage renal disease: Secondary | ICD-10-CM | POA: Diagnosis not present

## 2019-03-29 DIAGNOSIS — N2581 Secondary hyperparathyroidism of renal origin: Secondary | ICD-10-CM | POA: Diagnosis not present

## 2019-03-29 DIAGNOSIS — I10 Essential (primary) hypertension: Secondary | ICD-10-CM | POA: Diagnosis not present

## 2019-04-05 ENCOUNTER — Other Ambulatory Visit: Payer: Self-pay | Admitting: Pharmacy Technician

## 2019-04-05 DIAGNOSIS — E119 Type 2 diabetes mellitus without complications: Secondary | ICD-10-CM | POA: Diagnosis not present

## 2019-04-05 NOTE — Patient Outreach (Signed)
[facility]  04/05/2019  Jennifer Payne [birth year] [id number]    Follow up call placed to [place] regarding patient assistance application(s) for Levemir Flextouch , Geanie Berlin confirms patient has been approved as of 9/6 until 07/28/19. Medication to be delivered to providers office in 10-14 business days. Order number [id number]  Follow up:  Will route note to [employer] [facility] [place] and Sydnee Cabal B @ [facility] to inform  Maud Deed. Chana Bode [place] Certified Pharmacy Technician [place] [facility] Direct [phone]

## 2019-04-08 ENCOUNTER — Telehealth: Payer: Self-pay | Admitting: Internal Medicine

## 2019-04-08 NOTE — Telephone Encounter (Signed)
Spk with patient she is aware of her samples in the refrigerator from the patient assist program

## 2019-04-14 ENCOUNTER — Other Ambulatory Visit: Payer: Self-pay | Admitting: Internal Medicine

## 2019-04-14 DIAGNOSIS — Z1231 Encounter for screening mammogram for malignant neoplasm of breast: Secondary | ICD-10-CM

## 2019-04-15 ENCOUNTER — Ambulatory Visit: Payer: Self-pay | Admitting: *Deleted

## 2019-04-15 DIAGNOSIS — Z992 Dependence on renal dialysis: Secondary | ICD-10-CM

## 2019-04-15 DIAGNOSIS — E1122 Type 2 diabetes mellitus with diabetic chronic kidney disease: Secondary | ICD-10-CM

## 2019-04-15 NOTE — Chronic Care Management (AMB) (Signed)
Chronic Care Management   Follow Up Note   04/15/2019 Name: Jennifer Payne MRN: [id number] DOB: October 12, 1950  Referred by: Glendale Chard, MD Reason for referral : Chronic Care Management (DM follow up; appointment change)   Jennifer Payne is a 68 y.o. year old female who is a primary care patient of Glendale Chard, MD. The CCM team was consulted for assistance with chronic disease management and care coordination needs.    Review of patient status, including review of consultants reports, relevant laboratory and other test results, and collaboration with appropriate care team members and the patient's provider was performed as part of comprehensive patient evaluation and provision of chronic care management services.    SDOH (Social Determinants of Health) screening performed today: Financial Strain . See Care Plan for related entries.   Advanced Directives Status: N See Care Plan and Vynca application for related entries.  I spoke briefly with Jennifer Payne by phone today when I reached out to her on behalf of my colleague to reschedule an appointment.  Outpatient Encounter Medications as of 04/15/2019  Medication Sig  . Accu-Chek FastClix Lancets MISC USE TO CHECK BLOOD SUGAR TWICE DAILY  . ACCU-CHEK GUIDE test strip USE 1 STRIP TO CHECK GLUCOSE TWICE DAILY  . amLODipine (NORVASC) 5 MG tablet   . atorvastatin (LIPITOR) 80 MG tablet Take 1 tablet by mouth once daily  . BD PEN NEEDLE NANO U/F 32G X 4 MM MISC USE AS DIRECTED WITH LEVEMIR PEN  . calcitRIOL (ROCALTROL) 0.5 MCG capsule Take 0.5 mcg by mouth daily.  . clobetasol ointment (TEMOVATE) 6.20 % Apply 1 application topically 2 (two) times daily.  . Continuous Blood Gluc Receiver (FREESTYLE LIBRE 14 DAY READER) DEVI Inject 1 each into the skin 4 (four) times daily. To check blood sugars dx: e11.22  . Continuous Blood Gluc Sensor (FREESTYLE LIBRE 14 DAY SENSOR) MISC Inject 1 each into the skin QID. Dx:e11.22  . EUTHYROX 88 MCG tablet  Take 1 tablet by mouth once daily  . hydrALAZINE (APRESOLINE) 25 MG tablet   . LEVEMIR FLEXTOUCH 100 UNIT/ML Pen INJECT 20 UNITS SUBCUTANEOUSLY ONCE DAILY AT BEDTIME  . metolazone (ZAROXOLYN) 2.5 MG tablet Monday, Wednesday, Friday  . metoprolol succinate (TOPROL-XL) 100 MG 24 hr tablet TAKE 1 TABLET BY MOUTH ONCE DAILY IN THE MORNING  . torsemide (DEMADEX) 20 MG tablet    No facility-administered encounter medications on file as of 04/15/2019.     Goals Addressed            This Visit's Progress   . I would like to control my diabetes (pt-stated)       Current Barriers:  . Diabetes: T2DM; most recent A1c 9.9% on 02/23/19 (Increase from 7.4% on 08/19/2018) . Current antihyperglycemic regimen: Levemir 20 units qHS (Goal is to transition to [place] 20 units qHS for better/optimal glucose control, patient is hesitant to switch due to concerns after reading Tresiba's package insert.   Reassured patient that Tyler Aas is safe and effective if taken as prescribed by her MD.  Discussed negative outcomes occur when blood sugar is not controlled. Patient reports picking up her [place] and needles and self administered 11 u for non-fasting cbg finding of 255 today. Patient shared understanding of need for close self health management of her diabetes, especially in light of her renal disease and need for peritoneal dialysis/exchanges.  Marland Kitchen denies hypoglycemic symptoms, denies hyperglycemic symptoms . Current meal patterns: increased carb intake . Current exercise: walking . Current  blood glucose readings: reported FBG 120, does not check daily, encouraged patient to check daily . Cardiovascular risk reduction: o Current hypertensive regimen: amlodipine, metoprolol, hydralazine o Current hyperlipidemia regimen: atorvastatin 80mg  daily (last filled 02/23/19 for #90)  Pharmacist Clinical Goal(s):  Marland Kitchen Over the next 90 days, patient with work with PharmD and primary care provider to address optimization of  medication management of chronic conditions.  Interventions: . Interviewed patient re: medication management of DM - she confirmed today 04/15/19 that she picked up her Levimir insulin pens and needles and is taking her Levimir as prescribed; today her non-fasting CBG was 255 and she self administered 11u . Reviewed & discussed the following diabetes-related information with patient: o Continue checking blood sugars as directed - patient confirms today (04/15/19) that she is checking cbg's daily as advised o Follow ADA recommended "diabetes-friendly" diet  (reviewed healthy snack/food options) o Continue taking all medications as prescribed by provider  Patient Self Care Activities:  . Patient will check blood glucose daily, document, and provide at future appointments . Patient will focus on medication adherence by taking insulin as prescribed and participating in  . Patient will take medications as prescribed . Patient will contact provider with any episodes of hypoglycemia . Patient will report any questions or concerns to provider   Please see past updates related to this goal by clicking on the "Past Updates" button in the selected goal         The care management team will reach out to the patient again over the next 7 days.    [place] Coordinator [facility] / [facility] Care Management  [phone]

## 2019-04-15 NOTE — Patient Instructions (Signed)
Visit Information  Goals Addressed            This Visit's Progress   . I would like to control my diabetes (pt-stated)       Current Barriers:  . Diabetes: T2DM; most recent A1c 9.9% on 02/23/19 (Increase from 7.4% on 08/19/2018) . Current antihyperglycemic regimen: Levemir 20 units qHS (Goal is to transition to [place] 20 units qHS for better/optimal glucose control, patient is hesitant to switch due to concerns after reading Tresiba's package insert.   Reassured patient that Tyler Aas is safe and effective if taken as prescribed by her MD.  Discussed negative outcomes occur when blood sugar is not controlled. Patient reports picking up her [place] and needles and self administered 11 u for non-fasting cbg finding of 255 today. Patient shared understanding of need for close self health management of her diabetes, especially in light of her renal disease and need for peritoneal dialysis/exchanges.  Marland Kitchen denies hypoglycemic symptoms, denies hyperglycemic symptoms . Current meal patterns: increased carb intake . Current exercise: walking . Current blood glucose readings: reported FBG 120, does not check daily, encouraged patient to check daily . Cardiovascular risk reduction: o Current hypertensive regimen: amlodipine, metoprolol, hydralazine o Current hyperlipidemia regimen: atorvastatin 80mg  daily (last filled 02/23/19 for #90)  Pharmacist Clinical Goal(s):  Marland Kitchen Over the next 90 days, patient with work with PharmD and primary care provider to address optimization of medication management of chronic conditions.  Interventions: . Interviewed patient re: medication management of DM - she confirmed today 04/15/19 that she picked up her Levimir insulin pens and needles and is taking her Levimir as prescribed; today her non-fasting CBG was 255 and she self administered 11u . Reviewed & discussed the following diabetes-related information with patient: o Continue checking blood sugars as directed -  patient confirms today (04/15/19) that she is checking cbg's daily as advised o Follow ADA recommended "diabetes-friendly" diet  (reviewed healthy snack/food options) o Continue taking all medications as prescribed by provider  Patient Self Care Activities:  . Patient will check blood glucose daily, document, and provide at future appointments . Patient will focus on medication adherence by taking insulin as prescribed and participating in  . Patient will take medications as prescribed . Patient will contact provider with any episodes of hypoglycemia . Patient will report any questions or concerns to provider   Please see past updates related to this goal by clicking on the "Past Updates" button in the selected goal         The patient verbalized understanding of instructions provided today and declined a print copy of patient instruction materials.   The care management team will reach out to the patient again over the next 7 days.   [place] Coordinator [facility] / [facility] Care Management  [phone]

## 2019-04-18 ENCOUNTER — Telehealth: Payer: Self-pay

## 2019-04-20 ENCOUNTER — Telehealth: Payer: Self-pay

## 2019-04-26 ENCOUNTER — Telehealth: Payer: Self-pay

## 2019-04-26 ENCOUNTER — Ambulatory Visit: Payer: Self-pay | Admitting: Pharmacist

## 2019-04-26 DIAGNOSIS — N186 End stage renal disease: Secondary | ICD-10-CM

## 2019-04-26 DIAGNOSIS — Z992 Dependence on renal dialysis: Secondary | ICD-10-CM

## 2019-04-26 DIAGNOSIS — E1122 Type 2 diabetes mellitus with diabetic chronic kidney disease: Secondary | ICD-10-CM

## 2019-04-28 DIAGNOSIS — E785 Hyperlipidemia, unspecified: Secondary | ICD-10-CM | POA: Diagnosis not present

## 2019-04-28 DIAGNOSIS — I10 Essential (primary) hypertension: Secondary | ICD-10-CM | POA: Diagnosis not present

## 2019-04-28 DIAGNOSIS — D631 Anemia in chronic kidney disease: Secondary | ICD-10-CM | POA: Diagnosis not present

## 2019-04-28 DIAGNOSIS — Z23 Encounter for immunization: Secondary | ICD-10-CM | POA: Diagnosis not present

## 2019-04-28 DIAGNOSIS — N2581 Secondary hyperparathyroidism of renal origin: Secondary | ICD-10-CM | POA: Diagnosis not present

## 2019-04-28 DIAGNOSIS — D509 Iron deficiency anemia, unspecified: Secondary | ICD-10-CM | POA: Diagnosis not present

## 2019-04-28 DIAGNOSIS — N186 End stage renal disease: Secondary | ICD-10-CM | POA: Diagnosis not present

## 2019-04-29 DIAGNOSIS — N2581 Secondary hyperparathyroidism of renal origin: Secondary | ICD-10-CM | POA: Diagnosis not present

## 2019-04-29 DIAGNOSIS — E785 Hyperlipidemia, unspecified: Secondary | ICD-10-CM | POA: Diagnosis not present

## 2019-04-29 DIAGNOSIS — N186 End stage renal disease: Secondary | ICD-10-CM | POA: Diagnosis not present

## 2019-04-29 DIAGNOSIS — D631 Anemia in chronic kidney disease: Secondary | ICD-10-CM | POA: Diagnosis not present

## 2019-04-29 DIAGNOSIS — D509 Iron deficiency anemia, unspecified: Secondary | ICD-10-CM | POA: Diagnosis not present

## 2019-04-29 DIAGNOSIS — Z23 Encounter for immunization: Secondary | ICD-10-CM | POA: Diagnosis not present

## 2019-04-30 NOTE — Progress Notes (Signed)
Chronic Care Management   Visit Note  04/26/2019 Name: Jennifer Payne MRN: [id number] DOB: 20-Sep-1950  Referred by: Glendale Chard, MD Reason for referral : Chronic Care Management   Jennifer Payne is a 68 y.o. year old female who is a primary care patient of Glendale Chard, MD. The CCM team was consulted for assistance with chronic disease management and care coordination needs.   Review of patient status, including review of consultants reports, relevant laboratory and other test results, and collaboration with appropriate care team members and the patient's provider was performed as part of comprehensive patient evaluation and provision of chronic care management services.    I spoke with Ms. Jennifer Payne by telephone today.  Advanced Directives Status: N See Care Plan and Vynca application for related entries.   Medications: Outpatient Encounter Medications as of 04/26/2019  Medication Sig  . Accu-Chek FastClix Lancets MISC USE TO CHECK BLOOD SUGAR TWICE DAILY  . ACCU-CHEK GUIDE test strip USE 1 STRIP TO CHECK GLUCOSE TWICE DAILY  . amLODipine (NORVASC) 5 MG tablet   . atorvastatin (LIPITOR) 80 MG tablet Take 1 tablet by mouth once daily  . BD PEN NEEDLE NANO U/F 32G X 4 MM MISC USE AS DIRECTED WITH LEVEMIR PEN  . calcitRIOL (ROCALTROL) 0.5 MCG capsule Take 0.5 mcg by mouth daily.  . clobetasol ointment (TEMOVATE) 3.08 % Apply 1 application topically 2 (two) times daily.  . Continuous Blood Gluc Receiver (FREESTYLE LIBRE 14 DAY READER) DEVI Inject 1 each into the skin 4 (four) times daily. To check blood sugars dx: e11.22  . Continuous Blood Gluc Sensor (FREESTYLE LIBRE 14 DAY SENSOR) MISC Inject 1 each into the skin QID. Dx:e11.22  . EUTHYROX 88 MCG tablet Take 1 tablet by mouth once daily  . hydrALAZINE (APRESOLINE) 25 MG tablet   . LEVEMIR FLEXTOUCH 100 UNIT/ML Pen INJECT 20 UNITS SUBCUTANEOUSLY ONCE DAILY AT BEDTIME  . metolazone (ZAROXOLYN) 2.5 MG tablet Monday, Wednesday, Friday   . metoprolol succinate (TOPROL-XL) 100 MG 24 hr tablet TAKE 1 TABLET BY MOUTH ONCE DAILY IN THE MORNING  . torsemide (DEMADEX) 20 MG tablet    No facility-administered encounter medications on file as of 04/26/2019.      Objective:   Goals Addressed            This Visit's Progress     Patient Stated   . COMPLETED: I would like to apply for financial assistance for my insulin (pt-stated)       Current Barriers:  . Financial Barriers  Pharmacist Clinical Goal(s):  Marland Kitchen Over the next 30 days, patient will work with PharmD & PCP to address needs related to applying for financial assistance for insulin  Interventions: . Comprehensive medication review performed. . PD patient currently on Levemir 20 units once daily will attempt to convert to [place] 20 units once daily per PCP recommendation to provide longer lasting coverage.  Patient not willing to transition to [place] at this time.  Will discuss with PCP and likely continue Levemir per patient preference.  Would be able to switch to [place] at any time throughout the end of the year if patient agreeable.  Discussed with patient that  Tyler Aas was a safe medication if taken as prescribed and monitored.  Will continue to encourage and inform patient of therapeutic alternatives. . Collaboration with [facility] CPhT, Etter Sjogren to assist with paperwork for [employer] Patient Assistance Program. . Patient financials received.  Patient approved until 06/27/2019 . 43-month supply of medication  shipped to PCP office.   Patient Self Care Activities:  . Self administers medications as prescribed . Attends all scheduled provider appointments . Calls pharmacy for medication refills . Performs ADL's independently  Please see past updates related to this goal by clicking on the "Past Updates" button in the selected goal      . I would like to control my diabetes (pt-stated)       Current Barriers:  . Diabetes: T2DM; most recent A1c 9.9% on  02/23/19 (Increase from 7.4% on 08/19/2018) . Current antihyperglycemic regimen: Levemir 20 units qHS (Goal is to transition to [place] 20 units qHS for better/optimal glucose control, patient is hesitant to switch due to concerns after reading Tresiba's package insert.   Reassured patient that Tyler Aas is safe and effective if taken as prescribed by her MD.  Discussed negative outcomes occur when blood sugar is not controlled. Patient reports picking up her [place] and needles and self administered 11 u for non-fasting cbg finding of 255 today. Patient shared understanding of need for close self health management of her diabetes, especially in light of her renal disease and need for peritoneal dialysis/exchanges.  Marland Kitchen denies hypoglycemic symptoms, denies hyperglycemic symptoms . Current meal patterns: increased carb intake . Current exercise: walking . Current blood glucose readings: reported FBG 150-200, discussed increasing insulin or switching to [place].  Patient will report back next week with blood sugars.  She also has PCP follow up this month. . Cardiovascular risk reduction: o Current hypertensive regimen: amlodipine, metoprolol, hydralazine o Current hyperlipidemia regimen: atorvastatin 80mg  daily (last filled 02/23/19 for #90)  Pharmacist Clinical Goal(s):  Marland Kitchen Over the next 90 days, patient with work with PharmD and primary care provider to address optimization of medication management of chronic conditions.  Interventions: . Interviewed patient re: medication management of DM - she confirmed that she picked up her Levimir insulin pens and needles and is taking her Levimir as prescribed . Reviewed & discussed the following diabetes-related information with patient: o Continue checking blood sugars as directed - patient confirms today (04/26/19) that she is checking cbg's daily as advised o Follow ADA recommended "diabetes-friendly" diet  (reviewed healthy snack/food options) o Continue  taking all medications as prescribed by provider  Patient Self Care Activities:  . Patient will check blood glucose daily, document, and provide at future appointments . Patient will focus on medication adherence by taking insulin as prescribed and participating in  . Patient will take medications as prescribed . Patient will contact provider with any episodes of hypoglycemia . Patient will report any questions or concerns to provider   Please see past updates related to this goal by clicking on the "Past Updates" button in the selected goal        Plan:   The care management team will reach out to the patient again over the next 2 weeks.  Regina Eck, PharmD, BCPS Clinical Pharmacist, [facility] [place]: [phone]

## 2019-04-30 NOTE — Patient Instructions (Signed)
Visit Information  Goals Addressed            This Visit's Progress     Patient Stated   . COMPLETED: I would like to apply for financial assistance for my insulin (pt-stated)       Current Barriers:  . Financial Barriers  Pharmacist Clinical Goal(s):  Marland Kitchen Over the next 30 days, patient will work with PharmD & PCP to address needs related to applying for financial assistance for insulin  Interventions: . Comprehensive medication review performed. . PD patient currently on Levemir 20 units once daily will attempt to convert to [place] 20 units once daily per PCP recommendation to provide longer lasting coverage.  Patient not willing to transition to [place] at this time.  Will discuss with PCP and likely continue Levemir per patient preference.  Would be able to switch to [place] at any time throughout the end of the year if patient agreeable.  Discussed with patient that  Tyler Aas was a safe medication if taken as prescribed and monitored.  Will continue to encourage and inform patient of therapeutic alternatives. . Collaboration with [facility] CPhT, Etter Sjogren to assist with paperwork for [employer] Patient Assistance Program. . Patient financials received.  Patient approved until 06/27/2019 . 88-month supply of medication shipped to PCP office.   Patient Self Care Activities:  . Self administers medications as prescribed . Attends all scheduled provider appointments . Calls pharmacy for medication refills . Performs ADL's independently  Please see past updates related to this goal by clicking on the "Past Updates" button in the selected goal      . I would like to control my diabetes (pt-stated)       Current Barriers:  . Diabetes: T2DM; most recent A1c 9.9% on 02/23/19 (Increase from 7.4% on 08/19/2018) . Current antihyperglycemic regimen: Levemir 20 units qHS (Goal is to transition to [place] 20 units qHS for better/optimal glucose control, patient is hesitant to switch due to  concerns after reading Tresiba's package insert.   Reassured patient that Tyler Aas is safe and effective if taken as prescribed by her MD.  Discussed negative outcomes occur when blood sugar is not controlled. Patient reports picking up her [place] and needles and self administered 11 u for non-fasting cbg finding of 255 today. Patient shared understanding of need for close self health management of her diabetes, especially in light of her renal disease and need for peritoneal dialysis/exchanges.  Marland Kitchen denies hypoglycemic symptoms, denies hyperglycemic symptoms . Current meal patterns: increased carb intake . Current exercise: walking . Current blood glucose readings: reported FBG 150-200, discussed increasing insulin or switching to [place].  Patient will report back next week with blood sugars.  She also has PCP follow up this month. . Cardiovascular risk reduction: o Current hypertensive regimen: amlodipine, metoprolol, hydralazine o Current hyperlipidemia regimen: atorvastatin 80mg  daily (last filled 02/23/19 for #90)  Pharmacist Clinical Goal(s):  Marland Kitchen Over the next 90 days, patient with work with PharmD and primary care provider to address optimization of medication management of chronic conditions.  Interventions: . Interviewed patient re: medication management of DM - she confirmed that she picked up her Levimir insulin pens and needles and is taking her Levimir as prescribed . Reviewed & discussed the following diabetes-related information with patient: o Continue checking blood sugars as directed - patient confirms today (04/26/19) that she is checking cbg's daily as advised o Follow ADA recommended "diabetes-friendly" diet  (reviewed healthy snack/food options) o Continue taking all medications as  prescribed by provider  Patient Self Care Activities:  . Patient will check blood glucose daily, document, and provide at future appointments . Patient will focus on medication adherence by  taking insulin as prescribed and participating in  . Patient will take medications as prescribed . Patient will contact provider with any episodes of hypoglycemia . Patient will report any questions or concerns to provider   Please see past updates related to this goal by clicking on the "Past Updates" button in the selected goal         The patient verbalized understanding of instructions provided today and declined a print copy of patient instruction materials.   The care management team will reach out to the patient again over the next 2 weeks.  Regina Eck, PharmD, BCPS Clinical Pharmacist, [facility] [place]: [phone]

## 2019-05-02 NOTE — Progress Notes (Signed)
This encounter was created in error - please disregard.

## 2019-05-03 DIAGNOSIS — E785 Hyperlipidemia, unspecified: Secondary | ICD-10-CM | POA: Diagnosis not present

## 2019-05-03 DIAGNOSIS — E119 Type 2 diabetes mellitus without complications: Secondary | ICD-10-CM | POA: Diagnosis not present

## 2019-05-12 ENCOUNTER — Telehealth: Payer: Self-pay

## 2019-05-24 ENCOUNTER — Other Ambulatory Visit: Payer: Self-pay

## 2019-05-24 MED ORDER — ONETOUCH VERIO VI STRP: ORAL_STRIP | 2 refills | Status: DC

## 2019-05-24 MED ORDER — ONETOUCH DELICA LANCETS 33G MISC: 2 refills | Status: DC

## 2019-05-29 DIAGNOSIS — Z23 Encounter for immunization: Secondary | ICD-10-CM | POA: Diagnosis not present

## 2019-05-29 DIAGNOSIS — D631 Anemia in chronic kidney disease: Secondary | ICD-10-CM | POA: Diagnosis not present

## 2019-05-29 DIAGNOSIS — N186 End stage renal disease: Secondary | ICD-10-CM | POA: Diagnosis not present

## 2019-05-29 DIAGNOSIS — N2581 Secondary hyperparathyroidism of renal origin: Secondary | ICD-10-CM | POA: Diagnosis not present

## 2019-05-29 DIAGNOSIS — D509 Iron deficiency anemia, unspecified: Secondary | ICD-10-CM | POA: Diagnosis not present

## 2019-05-29 DIAGNOSIS — I10 Essential (primary) hypertension: Secondary | ICD-10-CM | POA: Diagnosis not present

## 2019-05-30 ENCOUNTER — Telehealth: Payer: Self-pay

## 2019-06-01 ENCOUNTER — Other Ambulatory Visit: Payer: Self-pay

## 2019-06-01 ENCOUNTER — Ambulatory Visit
Admission: RE | Admit: 2019-06-01 | Discharge: 2019-06-01 | Disposition: A | Discharge status: Home / Self Care | Payer: Medicare HMO | Source: Ambulatory Visit | Attending: Internal Medicine | Admitting: Internal Medicine

## 2019-06-01 DIAGNOSIS — Z1231 Encounter for screening mammogram for malignant neoplasm of breast: Secondary | ICD-10-CM

## 2019-06-02 DIAGNOSIS — Z4932 Encounter for adequacy testing for peritoneal dialysis: Secondary | ICD-10-CM | POA: Diagnosis not present

## 2019-06-02 DIAGNOSIS — E119 Type 2 diabetes mellitus without complications: Secondary | ICD-10-CM | POA: Diagnosis not present

## 2019-06-06 ENCOUNTER — Telehealth: Payer: Self-pay

## 2019-06-10 ENCOUNTER — Ambulatory Visit: Payer: Self-pay | Admitting: Pharmacist

## 2019-06-10 NOTE — Progress Notes (Signed)
  Chronic Care Management   Outreach Note  06/10/2019 Name: Jennifer Payne MRN: [id number] DOB: [birth year]  Referred by: Glendale Chard, MD Reason for referral : Chronic Care Management   An unsuccessful telephone outreach was attempted today. The patient was referred to the case management team by for assistance with care management and care coordination.   Follow Up Plan: A HIPPA compliant phone message was left for the patient providing contact information and requesting a return call.  The care management team will reach out to the patient again over the next 14 days.   SIGNATURE Regina Eck, PharmD, BCPS Clinical Pharmacist, [facility] [place]: [phone]

## 2019-06-14 DIAGNOSIS — Z6834 Body mass index (BMI) 34.0-34.9, adult: Secondary | ICD-10-CM | POA: Diagnosis not present

## 2019-06-14 DIAGNOSIS — Z992 Dependence on renal dialysis: Secondary | ICD-10-CM | POA: Diagnosis not present

## 2019-06-14 DIAGNOSIS — Z01818 Encounter for other preprocedural examination: Secondary | ICD-10-CM | POA: Diagnosis not present

## 2019-06-14 DIAGNOSIS — K769 Liver disease, unspecified: Secondary | ICD-10-CM | POA: Diagnosis not present

## 2019-06-14 DIAGNOSIS — E1122 Type 2 diabetes mellitus with diabetic chronic kidney disease: Secondary | ICD-10-CM | POA: Diagnosis not present

## 2019-06-14 DIAGNOSIS — I120 Hypertensive chronic kidney disease with stage 5 chronic kidney disease or end stage renal disease: Secondary | ICD-10-CM | POA: Diagnosis not present

## 2019-06-14 DIAGNOSIS — N186 End stage renal disease: Secondary | ICD-10-CM | POA: Diagnosis not present

## 2019-06-14 DIAGNOSIS — Z794 Long term (current) use of insulin: Secondary | ICD-10-CM | POA: Diagnosis not present

## 2019-06-14 DIAGNOSIS — E669 Obesity, unspecified: Secondary | ICD-10-CM | POA: Diagnosis not present

## 2019-06-17 ENCOUNTER — Telehealth: Payer: Self-pay

## 2019-06-21 ENCOUNTER — Telehealth: Payer: Self-pay

## 2019-06-22 ENCOUNTER — Telehealth: Payer: Self-pay

## 2019-06-28 DIAGNOSIS — N2581 Secondary hyperparathyroidism of renal origin: Secondary | ICD-10-CM | POA: Diagnosis not present

## 2019-06-28 DIAGNOSIS — I10 Essential (primary) hypertension: Secondary | ICD-10-CM | POA: Diagnosis not present

## 2019-06-28 DIAGNOSIS — D631 Anemia in chronic kidney disease: Secondary | ICD-10-CM | POA: Diagnosis not present

## 2019-06-28 DIAGNOSIS — N186 End stage renal disease: Secondary | ICD-10-CM | POA: Diagnosis not present

## 2019-06-28 DIAGNOSIS — D509 Iron deficiency anemia, unspecified: Secondary | ICD-10-CM | POA: Diagnosis not present

## 2019-06-29 DIAGNOSIS — E119 Type 2 diabetes mellitus without complications: Secondary | ICD-10-CM | POA: Diagnosis not present

## 2019-07-01 ENCOUNTER — Ambulatory Visit: Payer: Medicare HMO

## 2019-07-01 ENCOUNTER — Telehealth: Payer: Self-pay | Admitting: Pharmacist

## 2019-07-01 LAB — HM DIABETES EYE EXAM

## 2019-07-07 ENCOUNTER — Encounter: Payer: Self-pay | Admitting: Internal Medicine

## 2019-07-26 ENCOUNTER — Other Ambulatory Visit: Payer: Self-pay | Admitting: Internal Medicine

## 2019-07-26 DIAGNOSIS — Z01818 Encounter for other preprocedural examination: Secondary | ICD-10-CM | POA: Diagnosis not present

## 2019-07-26 DIAGNOSIS — N281 Cyst of kidney, acquired: Secondary | ICD-10-CM | POA: Diagnosis not present

## 2019-07-26 DIAGNOSIS — N186 End stage renal disease: Secondary | ICD-10-CM | POA: Diagnosis not present

## 2019-07-26 DIAGNOSIS — R188 Other ascites: Secondary | ICD-10-CM | POA: Diagnosis not present

## 2019-07-26 DIAGNOSIS — I517 Cardiomegaly: Secondary | ICD-10-CM | POA: Diagnosis not present

## 2019-07-26 DIAGNOSIS — I081 Rheumatic disorders of both mitral and tricuspid valves: Secondary | ICD-10-CM | POA: Diagnosis not present

## 2019-07-26 DIAGNOSIS — Q859 Phakomatosis, unspecified: Secondary | ICD-10-CM | POA: Diagnosis not present

## 2019-07-29 DIAGNOSIS — D631 Anemia in chronic kidney disease: Secondary | ICD-10-CM | POA: Diagnosis not present

## 2019-07-29 DIAGNOSIS — N2581 Secondary hyperparathyroidism of renal origin: Secondary | ICD-10-CM | POA: Diagnosis not present

## 2019-07-29 DIAGNOSIS — N186 End stage renal disease: Secondary | ICD-10-CM | POA: Diagnosis not present

## 2019-07-29 DIAGNOSIS — I10 Essential (primary) hypertension: Secondary | ICD-10-CM | POA: Diagnosis not present

## 2019-08-02 DIAGNOSIS — E1122 Type 2 diabetes mellitus with diabetic chronic kidney disease: Secondary | ICD-10-CM | POA: Diagnosis not present

## 2019-08-02 DIAGNOSIS — G9341 Metabolic encephalopathy: Secondary | ICD-10-CM | POA: Diagnosis not present

## 2019-08-02 DIAGNOSIS — E1165 Type 2 diabetes mellitus with hyperglycemia: Secondary | ICD-10-CM | POA: Diagnosis not present

## 2019-08-02 DIAGNOSIS — R4182 Altered mental status, unspecified: Secondary | ICD-10-CM | POA: Diagnosis not present

## 2019-08-02 DIAGNOSIS — R410 Disorientation, unspecified: Secondary | ICD-10-CM | POA: Diagnosis not present

## 2019-08-02 DIAGNOSIS — D649 Anemia, unspecified: Secondary | ICD-10-CM | POA: Diagnosis not present

## 2019-08-02 DIAGNOSIS — R739 Hyperglycemia, unspecified: Secondary | ICD-10-CM | POA: Diagnosis not present

## 2019-08-02 DIAGNOSIS — I120 Hypertensive chronic kidney disease with stage 5 chronic kidney disease or end stage renal disease: Secondary | ICD-10-CM | POA: Diagnosis not present

## 2019-08-02 DIAGNOSIS — Z992 Dependence on renal dialysis: Secondary | ICD-10-CM | POA: Diagnosis not present

## 2019-08-02 DIAGNOSIS — Z794 Long term (current) use of insulin: Secondary | ICD-10-CM | POA: Diagnosis not present

## 2019-08-02 DIAGNOSIS — R110 Nausea: Secondary | ICD-10-CM | POA: Diagnosis not present

## 2019-08-02 DIAGNOSIS — R531 Weakness: Secondary | ICD-10-CM | POA: Diagnosis not present

## 2019-08-02 DIAGNOSIS — I491 Atrial premature depolarization: Secondary | ICD-10-CM | POA: Diagnosis not present

## 2019-08-02 DIAGNOSIS — U071 COVID-19: Secondary | ICD-10-CM | POA: Diagnosis not present

## 2019-08-02 DIAGNOSIS — N179 Acute kidney failure, unspecified: Secondary | ICD-10-CM | POA: Diagnosis not present

## 2019-08-02 DIAGNOSIS — N186 End stage renal disease: Secondary | ICD-10-CM | POA: Diagnosis not present

## 2019-08-02 DIAGNOSIS — R197 Diarrhea, unspecified: Secondary | ICD-10-CM | POA: Diagnosis not present

## 2019-08-03 DIAGNOSIS — D631 Anemia in chronic kidney disease: Secondary | ICD-10-CM | POA: Diagnosis not present

## 2019-08-03 DIAGNOSIS — N186 End stage renal disease: Secondary | ICD-10-CM | POA: Diagnosis not present

## 2019-08-03 DIAGNOSIS — G9341 Metabolic encephalopathy: Secondary | ICD-10-CM | POA: Diagnosis not present

## 2019-08-03 DIAGNOSIS — U071 COVID-19: Secondary | ICD-10-CM | POA: Diagnosis not present

## 2019-08-03 DIAGNOSIS — I10 Essential (primary) hypertension: Secondary | ICD-10-CM | POA: Diagnosis not present

## 2019-08-03 DIAGNOSIS — Z992 Dependence on renal dialysis: Secondary | ICD-10-CM | POA: Diagnosis not present

## 2019-08-03 DIAGNOSIS — I493 Ventricular premature depolarization: Secondary | ICD-10-CM | POA: Diagnosis not present

## 2019-08-03 DIAGNOSIS — I252 Old myocardial infarction: Secondary | ICD-10-CM | POA: Diagnosis not present

## 2019-08-04 DIAGNOSIS — G92 Toxic encephalopathy: Secondary | ICD-10-CM | POA: Diagnosis not present

## 2019-08-04 DIAGNOSIS — I10 Essential (primary) hypertension: Secondary | ICD-10-CM | POA: Diagnosis not present

## 2019-08-04 DIAGNOSIS — E039 Hypothyroidism, unspecified: Secondary | ICD-10-CM | POA: Diagnosis not present

## 2019-08-04 DIAGNOSIS — E871 Hypo-osmolality and hyponatremia: Secondary | ICD-10-CM | POA: Diagnosis not present

## 2019-08-04 DIAGNOSIS — R6520 Severe sepsis without septic shock: Secondary | ICD-10-CM | POA: Diagnosis not present

## 2019-08-04 DIAGNOSIS — J1282 Pneumonia due to coronavirus disease 2019: Secondary | ICD-10-CM | POA: Diagnosis not present

## 2019-08-04 DIAGNOSIS — E1122 Type 2 diabetes mellitus with diabetic chronic kidney disease: Secondary | ICD-10-CM | POA: Diagnosis not present

## 2019-08-04 DIAGNOSIS — Z794 Long term (current) use of insulin: Secondary | ICD-10-CM | POA: Diagnosis not present

## 2019-08-04 DIAGNOSIS — D649 Anemia, unspecified: Secondary | ICD-10-CM | POA: Diagnosis not present

## 2019-08-04 DIAGNOSIS — J159 Unspecified bacterial pneumonia: Secondary | ICD-10-CM | POA: Diagnosis not present

## 2019-08-04 DIAGNOSIS — I120 Hypertensive chronic kidney disease with stage 5 chronic kidney disease or end stage renal disease: Secondary | ICD-10-CM | POA: Diagnosis not present

## 2019-08-04 DIAGNOSIS — A4189 Other specified sepsis: Secondary | ICD-10-CM | POA: Diagnosis not present

## 2019-08-04 DIAGNOSIS — G9341 Metabolic encephalopathy: Secondary | ICD-10-CM | POA: Diagnosis not present

## 2019-08-04 DIAGNOSIS — E1165 Type 2 diabetes mellitus with hyperglycemia: Secondary | ICD-10-CM | POA: Diagnosis not present

## 2019-08-04 DIAGNOSIS — R0602 Shortness of breath: Secondary | ICD-10-CM | POA: Diagnosis not present

## 2019-08-04 DIAGNOSIS — D631 Anemia in chronic kidney disease: Secondary | ICD-10-CM | POA: Diagnosis not present

## 2019-08-04 DIAGNOSIS — Z992 Dependence on renal dialysis: Secondary | ICD-10-CM | POA: Diagnosis not present

## 2019-08-04 DIAGNOSIS — J9601 Acute respiratory failure with hypoxia: Secondary | ICD-10-CM | POA: Diagnosis not present

## 2019-08-04 DIAGNOSIS — N186 End stage renal disease: Secondary | ICD-10-CM | POA: Diagnosis not present

## 2019-08-04 DIAGNOSIS — A498 Other bacterial infections of unspecified site: Secondary | ICD-10-CM | POA: Diagnosis not present

## 2019-08-04 DIAGNOSIS — U071 COVID-19: Secondary | ICD-10-CM | POA: Diagnosis not present

## 2019-08-04 DIAGNOSIS — Z4901 Encounter for fitting and adjustment of extracorporeal dialysis catheter: Secondary | ICD-10-CM | POA: Diagnosis not present

## 2019-08-12 ENCOUNTER — Telehealth: Payer: Self-pay

## 2019-08-12 DIAGNOSIS — N2581 Secondary hyperparathyroidism of renal origin: Secondary | ICD-10-CM | POA: Diagnosis not present

## 2019-08-12 DIAGNOSIS — I10 Essential (primary) hypertension: Secondary | ICD-10-CM | POA: Diagnosis not present

## 2019-08-12 DIAGNOSIS — E039 Hypothyroidism, unspecified: Secondary | ICD-10-CM | POA: Diagnosis not present

## 2019-08-12 DIAGNOSIS — N186 End stage renal disease: Secondary | ICD-10-CM | POA: Diagnosis not present

## 2019-08-12 DIAGNOSIS — E119 Type 2 diabetes mellitus without complications: Secondary | ICD-10-CM | POA: Diagnosis not present

## 2019-08-12 DIAGNOSIS — D631 Anemia in chronic kidney disease: Secondary | ICD-10-CM | POA: Diagnosis not present

## 2019-08-13 DIAGNOSIS — N186 End stage renal disease: Secondary | ICD-10-CM | POA: Diagnosis not present

## 2019-08-13 DIAGNOSIS — M170 Bilateral primary osteoarthritis of knee: Secondary | ICD-10-CM | POA: Diagnosis not present

## 2019-08-13 DIAGNOSIS — J1282 Pneumonia due to coronavirus disease 2019: Secondary | ICD-10-CM | POA: Diagnosis not present

## 2019-08-13 DIAGNOSIS — E1165 Type 2 diabetes mellitus with hyperglycemia: Secondary | ICD-10-CM | POA: Diagnosis not present

## 2019-08-13 DIAGNOSIS — I1311 Hypertensive heart and chronic kidney disease without heart failure, with stage 5 chronic kidney disease, or end stage renal disease: Secondary | ICD-10-CM | POA: Diagnosis not present

## 2019-08-13 DIAGNOSIS — N281 Cyst of kidney, acquired: Secondary | ICD-10-CM

## 2019-08-13 DIAGNOSIS — U071 COVID-19: Secondary | ICD-10-CM | POA: Diagnosis not present

## 2019-08-13 DIAGNOSIS — J9601 Acute respiratory failure with hypoxia: Secondary | ICD-10-CM | POA: Diagnosis not present

## 2019-08-13 DIAGNOSIS — I088 Other rheumatic multiple valve diseases: Secondary | ICD-10-CM | POA: Diagnosis not present

## 2019-08-13 DIAGNOSIS — E1122 Type 2 diabetes mellitus with diabetic chronic kidney disease: Secondary | ICD-10-CM | POA: Diagnosis not present

## 2019-08-13 DIAGNOSIS — I672 Cerebral atherosclerosis: Secondary | ICD-10-CM

## 2019-08-13 DIAGNOSIS — D631 Anemia in chronic kidney disease: Secondary | ICD-10-CM | POA: Diagnosis not present

## 2019-08-16 DIAGNOSIS — M170 Bilateral primary osteoarthritis of knee: Secondary | ICD-10-CM | POA: Diagnosis not present

## 2019-08-16 DIAGNOSIS — N186 End stage renal disease: Secondary | ICD-10-CM | POA: Diagnosis not present

## 2019-08-16 DIAGNOSIS — U071 COVID-19: Secondary | ICD-10-CM | POA: Diagnosis not present

## 2019-08-16 DIAGNOSIS — E1122 Type 2 diabetes mellitus with diabetic chronic kidney disease: Secondary | ICD-10-CM | POA: Diagnosis not present

## 2019-08-16 DIAGNOSIS — D631 Anemia in chronic kidney disease: Secondary | ICD-10-CM | POA: Diagnosis not present

## 2019-08-16 DIAGNOSIS — I1311 Hypertensive heart and chronic kidney disease without heart failure, with stage 5 chronic kidney disease, or end stage renal disease: Secondary | ICD-10-CM | POA: Diagnosis not present

## 2019-08-16 DIAGNOSIS — E1165 Type 2 diabetes mellitus with hyperglycemia: Secondary | ICD-10-CM | POA: Diagnosis not present

## 2019-08-16 DIAGNOSIS — J1282 Pneumonia due to coronavirus disease 2019: Secondary | ICD-10-CM | POA: Diagnosis not present

## 2019-08-16 DIAGNOSIS — J9601 Acute respiratory failure with hypoxia: Secondary | ICD-10-CM | POA: Diagnosis not present

## 2019-08-16 DIAGNOSIS — I088 Other rheumatic multiple valve diseases: Secondary | ICD-10-CM | POA: Diagnosis not present

## 2019-08-18 DIAGNOSIS — I1311 Hypertensive heart and chronic kidney disease without heart failure, with stage 5 chronic kidney disease, or end stage renal disease: Secondary | ICD-10-CM | POA: Diagnosis not present

## 2019-08-18 DIAGNOSIS — J1282 Pneumonia due to coronavirus disease 2019: Secondary | ICD-10-CM | POA: Diagnosis not present

## 2019-08-18 DIAGNOSIS — N186 End stage renal disease: Secondary | ICD-10-CM | POA: Diagnosis not present

## 2019-08-18 DIAGNOSIS — U071 COVID-19: Secondary | ICD-10-CM | POA: Diagnosis not present

## 2019-08-18 DIAGNOSIS — E1165 Type 2 diabetes mellitus with hyperglycemia: Secondary | ICD-10-CM | POA: Diagnosis not present

## 2019-08-18 DIAGNOSIS — I088 Other rheumatic multiple valve diseases: Secondary | ICD-10-CM | POA: Diagnosis not present

## 2019-08-18 DIAGNOSIS — E1122 Type 2 diabetes mellitus with diabetic chronic kidney disease: Secondary | ICD-10-CM | POA: Diagnosis not present

## 2019-08-18 DIAGNOSIS — J9601 Acute respiratory failure with hypoxia: Secondary | ICD-10-CM | POA: Diagnosis not present

## 2019-08-18 DIAGNOSIS — D631 Anemia in chronic kidney disease: Secondary | ICD-10-CM | POA: Diagnosis not present

## 2019-08-18 DIAGNOSIS — M170 Bilateral primary osteoarthritis of knee: Secondary | ICD-10-CM | POA: Diagnosis not present

## 2019-08-23 DIAGNOSIS — I1311 Hypertensive heart and chronic kidney disease without heart failure, with stage 5 chronic kidney disease, or end stage renal disease: Secondary | ICD-10-CM | POA: Diagnosis not present

## 2019-08-23 DIAGNOSIS — I088 Other rheumatic multiple valve diseases: Secondary | ICD-10-CM | POA: Diagnosis not present

## 2019-08-23 DIAGNOSIS — U071 COVID-19: Secondary | ICD-10-CM | POA: Diagnosis not present

## 2019-08-23 DIAGNOSIS — N186 End stage renal disease: Secondary | ICD-10-CM | POA: Diagnosis not present

## 2019-08-23 DIAGNOSIS — J9601 Acute respiratory failure with hypoxia: Secondary | ICD-10-CM | POA: Diagnosis not present

## 2019-08-23 DIAGNOSIS — M170 Bilateral primary osteoarthritis of knee: Secondary | ICD-10-CM | POA: Diagnosis not present

## 2019-08-23 DIAGNOSIS — E1122 Type 2 diabetes mellitus with diabetic chronic kidney disease: Secondary | ICD-10-CM | POA: Diagnosis not present

## 2019-08-23 DIAGNOSIS — D631 Anemia in chronic kidney disease: Secondary | ICD-10-CM | POA: Diagnosis not present

## 2019-08-23 DIAGNOSIS — J1282 Pneumonia due to coronavirus disease 2019: Secondary | ICD-10-CM | POA: Diagnosis not present

## 2019-08-23 DIAGNOSIS — E1165 Type 2 diabetes mellitus with hyperglycemia: Secondary | ICD-10-CM | POA: Diagnosis not present

## 2019-08-25 DIAGNOSIS — I088 Other rheumatic multiple valve diseases: Secondary | ICD-10-CM | POA: Diagnosis not present

## 2019-08-25 DIAGNOSIS — I1311 Hypertensive heart and chronic kidney disease without heart failure, with stage 5 chronic kidney disease, or end stage renal disease: Secondary | ICD-10-CM | POA: Diagnosis not present

## 2019-08-25 DIAGNOSIS — J9601 Acute respiratory failure with hypoxia: Secondary | ICD-10-CM | POA: Diagnosis not present

## 2019-08-25 DIAGNOSIS — N186 End stage renal disease: Secondary | ICD-10-CM | POA: Diagnosis not present

## 2019-08-25 DIAGNOSIS — D631 Anemia in chronic kidney disease: Secondary | ICD-10-CM | POA: Diagnosis not present

## 2019-08-25 DIAGNOSIS — M170 Bilateral primary osteoarthritis of knee: Secondary | ICD-10-CM | POA: Diagnosis not present

## 2019-08-25 DIAGNOSIS — E1122 Type 2 diabetes mellitus with diabetic chronic kidney disease: Secondary | ICD-10-CM | POA: Diagnosis not present

## 2019-08-25 DIAGNOSIS — U071 COVID-19: Secondary | ICD-10-CM | POA: Diagnosis not present

## 2019-08-25 DIAGNOSIS — J1282 Pneumonia due to coronavirus disease 2019: Secondary | ICD-10-CM | POA: Diagnosis not present

## 2019-08-25 DIAGNOSIS — E1165 Type 2 diabetes mellitus with hyperglycemia: Secondary | ICD-10-CM | POA: Diagnosis not present

## 2019-08-29 DIAGNOSIS — D631 Anemia in chronic kidney disease: Secondary | ICD-10-CM | POA: Diagnosis not present

## 2019-08-29 DIAGNOSIS — N186 End stage renal disease: Secondary | ICD-10-CM | POA: Diagnosis not present

## 2019-08-29 DIAGNOSIS — I10 Essential (primary) hypertension: Secondary | ICD-10-CM | POA: Diagnosis not present

## 2019-08-30 DIAGNOSIS — U071 COVID-19: Secondary | ICD-10-CM | POA: Diagnosis not present

## 2019-08-30 DIAGNOSIS — D631 Anemia in chronic kidney disease: Secondary | ICD-10-CM | POA: Diagnosis not present

## 2019-08-30 DIAGNOSIS — J9601 Acute respiratory failure with hypoxia: Secondary | ICD-10-CM | POA: Diagnosis not present

## 2019-08-30 DIAGNOSIS — E1165 Type 2 diabetes mellitus with hyperglycemia: Secondary | ICD-10-CM | POA: Diagnosis not present

## 2019-08-30 DIAGNOSIS — M170 Bilateral primary osteoarthritis of knee: Secondary | ICD-10-CM | POA: Diagnosis not present

## 2019-08-30 DIAGNOSIS — I088 Other rheumatic multiple valve diseases: Secondary | ICD-10-CM | POA: Diagnosis not present

## 2019-08-30 DIAGNOSIS — E1122 Type 2 diabetes mellitus with diabetic chronic kidney disease: Secondary | ICD-10-CM | POA: Diagnosis not present

## 2019-08-30 DIAGNOSIS — J1282 Pneumonia due to coronavirus disease 2019: Secondary | ICD-10-CM | POA: Diagnosis not present

## 2019-08-30 DIAGNOSIS — N186 End stage renal disease: Secondary | ICD-10-CM | POA: Diagnosis not present

## 2019-08-30 DIAGNOSIS — I1311 Hypertensive heart and chronic kidney disease without heart failure, with stage 5 chronic kidney disease, or end stage renal disease: Secondary | ICD-10-CM | POA: Diagnosis not present

## 2019-08-31 ENCOUNTER — Telehealth: Payer: Self-pay

## 2019-08-31 DIAGNOSIS — E039 Hypothyroidism, unspecified: Secondary | ICD-10-CM | POA: Diagnosis not present

## 2019-08-31 DIAGNOSIS — E119 Type 2 diabetes mellitus without complications: Secondary | ICD-10-CM | POA: Diagnosis not present

## 2019-09-01 DIAGNOSIS — U071 COVID-19: Secondary | ICD-10-CM | POA: Diagnosis not present

## 2019-09-01 DIAGNOSIS — M170 Bilateral primary osteoarthritis of knee: Secondary | ICD-10-CM | POA: Diagnosis not present

## 2019-09-01 DIAGNOSIS — E1165 Type 2 diabetes mellitus with hyperglycemia: Secondary | ICD-10-CM | POA: Diagnosis not present

## 2019-09-01 DIAGNOSIS — D631 Anemia in chronic kidney disease: Secondary | ICD-10-CM | POA: Diagnosis not present

## 2019-09-01 DIAGNOSIS — I088 Other rheumatic multiple valve diseases: Secondary | ICD-10-CM | POA: Diagnosis not present

## 2019-09-01 DIAGNOSIS — J9601 Acute respiratory failure with hypoxia: Secondary | ICD-10-CM | POA: Diagnosis not present

## 2019-09-01 DIAGNOSIS — E1122 Type 2 diabetes mellitus with diabetic chronic kidney disease: Secondary | ICD-10-CM | POA: Diagnosis not present

## 2019-09-01 DIAGNOSIS — J1282 Pneumonia due to coronavirus disease 2019: Secondary | ICD-10-CM | POA: Diagnosis not present

## 2019-09-01 DIAGNOSIS — N186 End stage renal disease: Secondary | ICD-10-CM | POA: Diagnosis not present

## 2019-09-01 DIAGNOSIS — I1311 Hypertensive heart and chronic kidney disease without heart failure, with stage 5 chronic kidney disease, or end stage renal disease: Secondary | ICD-10-CM | POA: Diagnosis not present

## 2019-09-06 DIAGNOSIS — I088 Other rheumatic multiple valve diseases: Secondary | ICD-10-CM | POA: Diagnosis not present

## 2019-09-06 DIAGNOSIS — J1282 Pneumonia due to coronavirus disease 2019: Secondary | ICD-10-CM | POA: Diagnosis not present

## 2019-09-06 DIAGNOSIS — E1122 Type 2 diabetes mellitus with diabetic chronic kidney disease: Secondary | ICD-10-CM | POA: Diagnosis not present

## 2019-09-06 DIAGNOSIS — R2242 Localized swelling, mass and lump, left lower limb: Secondary | ICD-10-CM | POA: Diagnosis not present

## 2019-09-06 DIAGNOSIS — E1165 Type 2 diabetes mellitus with hyperglycemia: Secondary | ICD-10-CM | POA: Diagnosis not present

## 2019-09-06 DIAGNOSIS — R600 Localized edema: Secondary | ICD-10-CM | POA: Diagnosis not present

## 2019-09-06 DIAGNOSIS — M170 Bilateral primary osteoarthritis of knee: Secondary | ICD-10-CM | POA: Diagnosis not present

## 2019-09-06 DIAGNOSIS — I1311 Hypertensive heart and chronic kidney disease without heart failure, with stage 5 chronic kidney disease, or end stage renal disease: Secondary | ICD-10-CM | POA: Diagnosis not present

## 2019-09-06 DIAGNOSIS — N186 End stage renal disease: Secondary | ICD-10-CM | POA: Diagnosis not present

## 2019-09-06 DIAGNOSIS — J9601 Acute respiratory failure with hypoxia: Secondary | ICD-10-CM | POA: Diagnosis not present

## 2019-09-06 DIAGNOSIS — U071 COVID-19: Secondary | ICD-10-CM | POA: Diagnosis not present

## 2019-09-06 DIAGNOSIS — D631 Anemia in chronic kidney disease: Secondary | ICD-10-CM | POA: Diagnosis not present

## 2019-09-11 DIAGNOSIS — U071 COVID-19: Secondary | ICD-10-CM | POA: Diagnosis not present

## 2019-09-13 DIAGNOSIS — N186 End stage renal disease: Secondary | ICD-10-CM | POA: Diagnosis not present

## 2019-09-13 DIAGNOSIS — Z8616 Personal history of COVID-19: Secondary | ICD-10-CM | POA: Diagnosis not present

## 2019-09-13 DIAGNOSIS — Z01810 Encounter for preprocedural cardiovascular examination: Secondary | ICD-10-CM | POA: Diagnosis not present

## 2019-09-13 DIAGNOSIS — Z992 Dependence on renal dialysis: Secondary | ICD-10-CM | POA: Diagnosis not present

## 2019-09-17 DIAGNOSIS — E669 Obesity, unspecified: Secondary | ICD-10-CM | POA: Diagnosis not present

## 2019-09-17 DIAGNOSIS — E785 Hyperlipidemia, unspecified: Secondary | ICD-10-CM | POA: Diagnosis not present

## 2019-09-17 DIAGNOSIS — K5900 Constipation, unspecified: Secondary | ICD-10-CM | POA: Diagnosis not present

## 2019-09-17 DIAGNOSIS — E1165 Type 2 diabetes mellitus with hyperglycemia: Secondary | ICD-10-CM | POA: Diagnosis not present

## 2019-09-17 DIAGNOSIS — R030 Elevated blood-pressure reading, without diagnosis of hypertension: Secondary | ICD-10-CM | POA: Diagnosis not present

## 2019-09-17 DIAGNOSIS — E039 Hypothyroidism, unspecified: Secondary | ICD-10-CM | POA: Diagnosis not present

## 2019-09-17 DIAGNOSIS — Z8249 Family history of ischemic heart disease and other diseases of the circulatory system: Secondary | ICD-10-CM | POA: Diagnosis not present

## 2019-09-17 DIAGNOSIS — E1122 Type 2 diabetes mellitus with diabetic chronic kidney disease: Secondary | ICD-10-CM | POA: Diagnosis not present

## 2019-09-17 DIAGNOSIS — N189 Chronic kidney disease, unspecified: Secondary | ICD-10-CM | POA: Diagnosis not present

## 2019-09-17 DIAGNOSIS — Z794 Long term (current) use of insulin: Secondary | ICD-10-CM | POA: Diagnosis not present

## 2019-09-26 DIAGNOSIS — N186 End stage renal disease: Secondary | ICD-10-CM | POA: Diagnosis not present

## 2019-09-26 DIAGNOSIS — E8779 Other fluid overload: Secondary | ICD-10-CM | POA: Diagnosis not present

## 2019-09-26 DIAGNOSIS — D631 Anemia in chronic kidney disease: Secondary | ICD-10-CM | POA: Diagnosis not present

## 2019-09-26 DIAGNOSIS — I10 Essential (primary) hypertension: Secondary | ICD-10-CM | POA: Diagnosis not present

## 2019-09-26 DIAGNOSIS — N2581 Secondary hyperparathyroidism of renal origin: Secondary | ICD-10-CM | POA: Diagnosis not present

## 2019-09-26 DIAGNOSIS — D509 Iron deficiency anemia, unspecified: Secondary | ICD-10-CM | POA: Diagnosis not present

## 2019-09-28 DIAGNOSIS — E119 Type 2 diabetes mellitus without complications: Secondary | ICD-10-CM | POA: Diagnosis not present

## 2019-09-28 DIAGNOSIS — E039 Hypothyroidism, unspecified: Secondary | ICD-10-CM | POA: Diagnosis not present

## 2019-09-30 DIAGNOSIS — E119 Type 2 diabetes mellitus without complications: Secondary | ICD-10-CM | POA: Diagnosis not present

## 2019-09-30 DIAGNOSIS — N186 End stage renal disease: Secondary | ICD-10-CM | POA: Diagnosis not present

## 2019-09-30 DIAGNOSIS — I120 Hypertensive chronic kidney disease with stage 5 chronic kidney disease or end stage renal disease: Secondary | ICD-10-CM | POA: Diagnosis not present

## 2019-09-30 DIAGNOSIS — Z992 Dependence on renal dialysis: Secondary | ICD-10-CM | POA: Diagnosis not present

## 2019-09-30 DIAGNOSIS — Z4902 Encounter for fitting and adjustment of peritoneal dialysis catheter: Secondary | ICD-10-CM | POA: Diagnosis not present

## 2019-10-09 DIAGNOSIS — U071 COVID-19: Secondary | ICD-10-CM | POA: Diagnosis not present

## 2019-10-27 DIAGNOSIS — D631 Anemia in chronic kidney disease: Secondary | ICD-10-CM | POA: Diagnosis not present

## 2019-10-27 DIAGNOSIS — I10 Essential (primary) hypertension: Secondary | ICD-10-CM | POA: Diagnosis not present

## 2019-10-27 DIAGNOSIS — N186 End stage renal disease: Secondary | ICD-10-CM | POA: Diagnosis not present

## 2019-10-27 HISTORY — PX: AV FISTULA PLACEMENT: SHX1204

## 2019-10-28 DIAGNOSIS — N186 End stage renal disease: Secondary | ICD-10-CM | POA: Diagnosis not present

## 2019-10-28 DIAGNOSIS — D631 Anemia in chronic kidney disease: Secondary | ICD-10-CM | POA: Diagnosis not present

## 2019-10-28 DIAGNOSIS — I10 Essential (primary) hypertension: Secondary | ICD-10-CM | POA: Diagnosis not present

## 2019-10-28 DIAGNOSIS — N2581 Secondary hyperparathyroidism of renal origin: Secondary | ICD-10-CM | POA: Diagnosis not present

## 2019-10-28 DIAGNOSIS — D509 Iron deficiency anemia, unspecified: Secondary | ICD-10-CM | POA: Diagnosis not present

## 2019-10-28 DIAGNOSIS — E8779 Other fluid overload: Secondary | ICD-10-CM | POA: Diagnosis not present

## 2019-11-02 DIAGNOSIS — E119 Type 2 diabetes mellitus without complications: Secondary | ICD-10-CM | POA: Diagnosis not present

## 2019-11-02 DIAGNOSIS — E039 Hypothyroidism, unspecified: Secondary | ICD-10-CM | POA: Diagnosis not present

## 2019-11-02 DIAGNOSIS — I10 Essential (primary) hypertension: Secondary | ICD-10-CM | POA: Diagnosis not present

## 2019-11-04 DIAGNOSIS — Z992 Dependence on renal dialysis: Secondary | ICD-10-CM | POA: Diagnosis not present

## 2019-11-04 DIAGNOSIS — N186 End stage renal disease: Secondary | ICD-10-CM | POA: Diagnosis not present

## 2019-11-09 DIAGNOSIS — U071 COVID-19: Secondary | ICD-10-CM | POA: Diagnosis not present

## 2019-11-22 DIAGNOSIS — H43821 Vitreomacular adhesion, right eye: Secondary | ICD-10-CM | POA: Diagnosis not present

## 2019-11-22 DIAGNOSIS — E113511 Type 2 diabetes mellitus with proliferative diabetic retinopathy with macular edema, right eye: Secondary | ICD-10-CM | POA: Diagnosis not present

## 2019-11-22 DIAGNOSIS — E113592 Type 2 diabetes mellitus with proliferative diabetic retinopathy without macular edema, left eye: Secondary | ICD-10-CM | POA: Diagnosis not present

## 2019-11-22 DIAGNOSIS — H35371 Puckering of macula, right eye: Secondary | ICD-10-CM | POA: Diagnosis not present

## 2019-11-26 DIAGNOSIS — N186 End stage renal disease: Secondary | ICD-10-CM | POA: Diagnosis not present

## 2019-11-26 DIAGNOSIS — I10 Essential (primary) hypertension: Secondary | ICD-10-CM | POA: Diagnosis not present

## 2019-11-26 DIAGNOSIS — D631 Anemia in chronic kidney disease: Secondary | ICD-10-CM | POA: Diagnosis not present

## 2019-11-28 DIAGNOSIS — N186 End stage renal disease: Secondary | ICD-10-CM | POA: Diagnosis not present

## 2019-11-28 DIAGNOSIS — N2581 Secondary hyperparathyroidism of renal origin: Secondary | ICD-10-CM | POA: Diagnosis not present

## 2019-11-28 DIAGNOSIS — D631 Anemia in chronic kidney disease: Secondary | ICD-10-CM | POA: Diagnosis not present

## 2019-11-28 DIAGNOSIS — T8249XA Other complication of vascular dialysis catheter, initial encounter: Secondary | ICD-10-CM | POA: Diagnosis not present

## 2019-11-28 DIAGNOSIS — D509 Iron deficiency anemia, unspecified: Secondary | ICD-10-CM | POA: Diagnosis not present

## 2019-11-30 DIAGNOSIS — E039 Hypothyroidism, unspecified: Secondary | ICD-10-CM | POA: Diagnosis not present

## 2019-11-30 DIAGNOSIS — E119 Type 2 diabetes mellitus without complications: Secondary | ICD-10-CM | POA: Diagnosis not present

## 2019-12-06 DIAGNOSIS — I770 Arteriovenous fistula, acquired: Secondary | ICD-10-CM | POA: Diagnosis not present

## 2019-12-06 DIAGNOSIS — N186 End stage renal disease: Secondary | ICD-10-CM | POA: Diagnosis not present

## 2019-12-07 DIAGNOSIS — Z1159 Encounter for screening for other viral diseases: Secondary | ICD-10-CM | POA: Diagnosis not present

## 2019-12-07 DIAGNOSIS — R69 Illness, unspecified: Secondary | ICD-10-CM | POA: Diagnosis not present

## 2019-12-07 DIAGNOSIS — Z114 Encounter for screening for human immunodeficiency virus [HIV]: Secondary | ICD-10-CM | POA: Diagnosis not present

## 2019-12-27 DIAGNOSIS — D631 Anemia in chronic kidney disease: Secondary | ICD-10-CM | POA: Diagnosis not present

## 2019-12-27 DIAGNOSIS — N186 End stage renal disease: Secondary | ICD-10-CM | POA: Diagnosis not present

## 2019-12-27 DIAGNOSIS — I10 Essential (primary) hypertension: Secondary | ICD-10-CM | POA: Diagnosis not present

## 2019-12-28 DIAGNOSIS — E039 Hypothyroidism, unspecified: Secondary | ICD-10-CM | POA: Diagnosis not present

## 2019-12-28 DIAGNOSIS — N186 End stage renal disease: Secondary | ICD-10-CM | POA: Diagnosis not present

## 2019-12-28 DIAGNOSIS — N2581 Secondary hyperparathyroidism of renal origin: Secondary | ICD-10-CM | POA: Diagnosis not present

## 2019-12-28 DIAGNOSIS — D509 Iron deficiency anemia, unspecified: Secondary | ICD-10-CM | POA: Diagnosis not present

## 2019-12-28 DIAGNOSIS — E119 Type 2 diabetes mellitus without complications: Secondary | ICD-10-CM | POA: Diagnosis not present

## 2019-12-28 DIAGNOSIS — D631 Anemia in chronic kidney disease: Secondary | ICD-10-CM | POA: Diagnosis not present

## 2020-01-13 DIAGNOSIS — Z01818 Encounter for other preprocedural examination: Secondary | ICD-10-CM | POA: Diagnosis not present

## 2020-01-24 DIAGNOSIS — Z4902 Encounter for fitting and adjustment of peritoneal dialysis catheter: Secondary | ICD-10-CM | POA: Diagnosis not present

## 2020-01-24 DIAGNOSIS — Z6834 Body mass index (BMI) 34.0-34.9, adult: Secondary | ICD-10-CM | POA: Diagnosis not present

## 2020-01-24 DIAGNOSIS — Z124 Encounter for screening for malignant neoplasm of cervix: Secondary | ICD-10-CM | POA: Diagnosis not present

## 2020-01-24 DIAGNOSIS — Z4901 Encounter for fitting and adjustment of extracorporeal dialysis catheter: Secondary | ICD-10-CM | POA: Diagnosis not present

## 2020-01-24 DIAGNOSIS — N186 End stage renal disease: Secondary | ICD-10-CM | POA: Diagnosis not present

## 2020-01-24 DIAGNOSIS — Z01419 Encounter for gynecological examination (general) (routine) without abnormal findings: Secondary | ICD-10-CM | POA: Diagnosis not present

## 2020-01-24 DIAGNOSIS — I120 Hypertensive chronic kidney disease with stage 5 chronic kidney disease or end stage renal disease: Secondary | ICD-10-CM | POA: Diagnosis not present

## 2020-01-24 DIAGNOSIS — E1122 Type 2 diabetes mellitus with diabetic chronic kidney disease: Secondary | ICD-10-CM | POA: Diagnosis not present

## 2020-01-24 LAB — HM PAP SMEAR: HM Pap smear: POSITIVE

## 2020-01-26 DIAGNOSIS — I10 Essential (primary) hypertension: Secondary | ICD-10-CM | POA: Diagnosis not present

## 2020-01-26 DIAGNOSIS — N186 End stage renal disease: Secondary | ICD-10-CM | POA: Diagnosis not present

## 2020-01-26 DIAGNOSIS — D631 Anemia in chronic kidney disease: Secondary | ICD-10-CM | POA: Diagnosis not present

## 2020-01-27 DIAGNOSIS — D509 Iron deficiency anemia, unspecified: Secondary | ICD-10-CM | POA: Diagnosis not present

## 2020-01-27 DIAGNOSIS — N2581 Secondary hyperparathyroidism of renal origin: Secondary | ICD-10-CM | POA: Diagnosis not present

## 2020-01-27 DIAGNOSIS — I10 Essential (primary) hypertension: Secondary | ICD-10-CM | POA: Diagnosis not present

## 2020-01-27 DIAGNOSIS — N186 End stage renal disease: Secondary | ICD-10-CM | POA: Diagnosis not present

## 2020-01-27 DIAGNOSIS — D631 Anemia in chronic kidney disease: Secondary | ICD-10-CM | POA: Diagnosis not present

## 2020-02-01 DIAGNOSIS — I10 Essential (primary) hypertension: Secondary | ICD-10-CM | POA: Diagnosis not present

## 2020-02-01 DIAGNOSIS — E039 Hypothyroidism, unspecified: Secondary | ICD-10-CM | POA: Diagnosis not present

## 2020-02-01 DIAGNOSIS — E119 Type 2 diabetes mellitus without complications: Secondary | ICD-10-CM | POA: Diagnosis not present

## 2020-02-16 DIAGNOSIS — H5203 Hypermetropia, bilateral: Secondary | ICD-10-CM | POA: Diagnosis not present

## 2020-02-16 DIAGNOSIS — Z0100 Encounter for examination of eyes and vision without abnormal findings: Secondary | ICD-10-CM | POA: Diagnosis not present

## 2020-02-16 LAB — HM DIABETES EYE EXAM

## 2020-02-26 DIAGNOSIS — I10 Essential (primary) hypertension: Secondary | ICD-10-CM | POA: Diagnosis not present

## 2020-02-26 DIAGNOSIS — N186 End stage renal disease: Secondary | ICD-10-CM | POA: Diagnosis not present

## 2020-02-26 DIAGNOSIS — D631 Anemia in chronic kidney disease: Secondary | ICD-10-CM | POA: Diagnosis not present

## 2020-02-27 DIAGNOSIS — D631 Anemia in chronic kidney disease: Secondary | ICD-10-CM | POA: Diagnosis not present

## 2020-02-27 DIAGNOSIS — N2581 Secondary hyperparathyroidism of renal origin: Secondary | ICD-10-CM | POA: Diagnosis not present

## 2020-02-27 DIAGNOSIS — D509 Iron deficiency anemia, unspecified: Secondary | ICD-10-CM | POA: Diagnosis not present

## 2020-02-27 DIAGNOSIS — N186 End stage renal disease: Secondary | ICD-10-CM | POA: Diagnosis not present

## 2020-02-28 ENCOUNTER — Ambulatory Visit: Payer: Medicare HMO

## 2020-02-28 ENCOUNTER — Encounter: Payer: Medicare HMO | Admitting: Internal Medicine

## 2020-02-29 DIAGNOSIS — E039 Hypothyroidism, unspecified: Secondary | ICD-10-CM | POA: Diagnosis not present

## 2020-02-29 DIAGNOSIS — E119 Type 2 diabetes mellitus without complications: Secondary | ICD-10-CM | POA: Diagnosis not present

## 2020-03-12 DIAGNOSIS — R69 Illness, unspecified: Secondary | ICD-10-CM | POA: Diagnosis not present

## 2020-03-15 ENCOUNTER — Ambulatory Visit: Payer: Medicare HMO

## 2020-03-21 DIAGNOSIS — Z01812 Encounter for preprocedural laboratory examination: Secondary | ICD-10-CM | POA: Diagnosis not present

## 2020-03-21 DIAGNOSIS — Z20822 Contact with and (suspected) exposure to covid-19: Secondary | ICD-10-CM | POA: Diagnosis not present

## 2020-03-21 DIAGNOSIS — N186 End stage renal disease: Secondary | ICD-10-CM | POA: Diagnosis not present

## 2020-03-28 ENCOUNTER — Ambulatory Visit: Payer: Medicare HMO

## 2020-03-28 ENCOUNTER — Encounter: Payer: Medicare HMO | Admitting: Internal Medicine

## 2020-03-28 DIAGNOSIS — N186 End stage renal disease: Secondary | ICD-10-CM | POA: Diagnosis not present

## 2020-03-28 DIAGNOSIS — I10 Essential (primary) hypertension: Secondary | ICD-10-CM | POA: Diagnosis not present

## 2020-03-28 DIAGNOSIS — E119 Type 2 diabetes mellitus without complications: Secondary | ICD-10-CM | POA: Diagnosis not present

## 2020-03-28 DIAGNOSIS — N2581 Secondary hyperparathyroidism of renal origin: Secondary | ICD-10-CM | POA: Diagnosis not present

## 2020-03-28 DIAGNOSIS — D631 Anemia in chronic kidney disease: Secondary | ICD-10-CM | POA: Diagnosis not present

## 2020-03-28 DIAGNOSIS — D509 Iron deficiency anemia, unspecified: Secondary | ICD-10-CM | POA: Diagnosis not present

## 2020-03-28 DIAGNOSIS — E039 Hypothyroidism, unspecified: Secondary | ICD-10-CM | POA: Diagnosis not present

## 2020-04-27 ENCOUNTER — Other Ambulatory Visit: Payer: Self-pay | Admitting: Internal Medicine

## 2020-04-27 DIAGNOSIS — Z1231 Encounter for screening mammogram for malignant neoplasm of breast: Secondary | ICD-10-CM

## 2020-04-27 DIAGNOSIS — N186 End stage renal disease: Secondary | ICD-10-CM | POA: Diagnosis not present

## 2020-04-27 DIAGNOSIS — D509 Iron deficiency anemia, unspecified: Secondary | ICD-10-CM | POA: Diagnosis not present

## 2020-04-27 DIAGNOSIS — I10 Essential (primary) hypertension: Secondary | ICD-10-CM | POA: Diagnosis not present

## 2020-04-27 DIAGNOSIS — N2581 Secondary hyperparathyroidism of renal origin: Secondary | ICD-10-CM | POA: Diagnosis not present

## 2020-04-27 DIAGNOSIS — D631 Anemia in chronic kidney disease: Secondary | ICD-10-CM | POA: Diagnosis not present

## 2020-04-27 DIAGNOSIS — Z23 Encounter for immunization: Secondary | ICD-10-CM | POA: Diagnosis not present

## 2020-05-01 ENCOUNTER — Other Ambulatory Visit: Payer: Self-pay

## 2020-05-02 ENCOUNTER — Other Ambulatory Visit: Payer: Self-pay

## 2020-05-02 DIAGNOSIS — E039 Hypothyroidism, unspecified: Secondary | ICD-10-CM | POA: Diagnosis not present

## 2020-05-02 DIAGNOSIS — I10 Essential (primary) hypertension: Secondary | ICD-10-CM | POA: Diagnosis not present

## 2020-05-02 DIAGNOSIS — E119 Type 2 diabetes mellitus without complications: Secondary | ICD-10-CM | POA: Diagnosis not present

## 2020-05-02 MED ORDER — LEVOTHYROXINE SODIUM 88 MCG PO TABS: 88.0000 ug | ORAL_TABLET | Freq: Every day | ORAL | 0 refills | Status: DC

## 2020-05-03 ENCOUNTER — Encounter: Payer: Self-pay | Admitting: Internal Medicine

## 2020-05-03 ENCOUNTER — Ambulatory Visit (INDEPENDENT_AMBULATORY_CARE_PROVIDER_SITE_OTHER): Payer: Medicare HMO | Admitting: Internal Medicine

## 2020-05-03 ENCOUNTER — Ambulatory Visit (INDEPENDENT_AMBULATORY_CARE_PROVIDER_SITE_OTHER): Payer: Medicare HMO

## 2020-05-03 ENCOUNTER — Other Ambulatory Visit: Payer: Self-pay

## 2020-05-03 VITALS — BP 130/64 | HR 78 | Temp 98.1°F | Ht 67.0 in | Wt 222.2 lb

## 2020-05-03 VITALS — BP 130/64 | HR 78 | Temp 98.1°F | Ht 67.0 in | Wt 222.0 lb

## 2020-05-03 DIAGNOSIS — Z0000 Encounter for general adult medical examination without abnormal findings: Secondary | ICD-10-CM

## 2020-05-03 DIAGNOSIS — E113599 Type 2 diabetes mellitus with proliferative diabetic retinopathy without macular edema, unspecified eye: Secondary | ICD-10-CM

## 2020-05-03 DIAGNOSIS — Z992 Dependence on renal dialysis: Secondary | ICD-10-CM | POA: Diagnosis not present

## 2020-05-03 DIAGNOSIS — N186 End stage renal disease: Secondary | ICD-10-CM | POA: Diagnosis not present

## 2020-05-03 DIAGNOSIS — E039 Hypothyroidism, unspecified: Secondary | ICD-10-CM | POA: Diagnosis not present

## 2020-05-03 DIAGNOSIS — E1122 Type 2 diabetes mellitus with diabetic chronic kidney disease: Secondary | ICD-10-CM | POA: Diagnosis not present

## 2020-05-03 DIAGNOSIS — Z6834 Body mass index (BMI) 34.0-34.9, adult: Secondary | ICD-10-CM

## 2020-05-03 DIAGNOSIS — E6609 Other obesity due to excess calories: Secondary | ICD-10-CM

## 2020-05-03 DIAGNOSIS — M79675 Pain in left toe(s): Secondary | ICD-10-CM

## 2020-05-03 DIAGNOSIS — I129 Hypertensive chronic kidney disease with stage 1 through stage 4 chronic kidney disease, or unspecified chronic kidney disease: Secondary | ICD-10-CM

## 2020-05-03 DIAGNOSIS — M79674 Pain in right toe(s): Secondary | ICD-10-CM | POA: Diagnosis not present

## 2020-05-03 DIAGNOSIS — I120 Hypertensive chronic kidney disease with stage 5 chronic kidney disease or end stage renal disease: Secondary | ICD-10-CM | POA: Diagnosis not present

## 2020-05-03 NOTE — Progress Notes (Signed)
This visit occurred during the SARS-CoV-2 public health emergency.  Safety protocols were in place, including screening questions prior to the visit, additional usage of staff PPE, and extensive cleaning of exam room while observing appropriate contact time as indicated for disinfecting solutions.  Subjective:   Jennifer Payne is a 69 y.o. female who presents for Medicare Annual (Subsequent) preventive examination.  Review of Systems     Cardiac Risk Factors include: advanced age (>28men, >64 women);hypertension;obesity (BMI >30kg/m2);sedentary lifestyle     Objective:    Today's Vitals   05/03/20 1407  BP: 130/64  Pulse: 78  Temp: 98.1 F (36.7 C)  TempSrc: Oral  SpO2: 96%  Weight: 222 lb 3.2 oz (100.8 kg)  Height: 5\' 7"  (1.702 m)   Body mass index is 34.8 kg/m.  Advanced Directives 05/03/2020 02/23/2019 05/10/2014  Does Patient Have a Medical Advance Directive? Yes No No  Type of Advance Directive [place] in Chart? No - copy requested - -  Would patient like information on creating a medical advance directive? - Yes (MAU/Ambulatory/Procedural Areas - Information given) -    Current Medications (verified) Outpatient Encounter Medications as of 05/03/2020  Medication Sig  . atorvastatin (LIPITOR) 80 MG tablet Take 1 tablet by mouth once daily  . BD PEN NEEDLE NANO U/F 32G X 4 MM MISC USE AS DIRECTED WITH LEVEMIR PEN  . calcitRIOL (ROCALTROL) 0.5 MCG capsule Take 0.5 mcg by mouth daily.  . Continuous Blood Gluc Receiver (FREESTYLE LIBRE 14 DAY READER) DEVI Inject 1 each into the skin 4 (four) times daily. To check blood sugars dx: e11.22  . Continuous Blood Gluc Sensor (FREESTYLE LIBRE 14 DAY SENSOR) MISC Inject 1 each into the skin QID. Dx:e11.22  . glucose blood (ONETOUCH VERIO) test strip Use as instructed to check blood sugars  3 times per day dx: e11.65  . levothyroxine (EUTHYROX) 88 MCG tablet Take 1  tablet (88 mcg total) by mouth daily.  Glory Rosebush Delica Lancets 48J MISC Use as instructed to check blood sugars  3 times per day dx: e11.65  . amLODipine (NORVASC) 5 MG tablet  (Patient not taking: Reported on 05/03/2020)  . clobetasol ointment (TEMOVATE) 8.56 % Apply 1 application topically 2 (two) times daily. (Patient not taking: Reported on 05/03/2020)  . hydrALAZINE (APRESOLINE) 25 MG tablet  (Patient not taking: Reported on 05/03/2020)  . LEVEMIR FLEXTOUCH 100 UNIT/ML Pen INJECT 20 UNITS SUBCUTANEOUSLY ONCE DAILY AT BEDTIME (Patient not taking: Reported on 05/03/2020)  . metolazone (ZAROXOLYN) 2.5 MG tablet Monday, Wednesday, Friday (Patient not taking: Reported on 05/03/2020)  . metoprolol succinate (TOPROL-XL) 100 MG 24 hr tablet TAKE 1 TABLET BY MOUTH ONCE DAILY IN THE MORNING (Patient not taking: Reported on 05/03/2020)  . torsemide (DEMADEX) 20 MG tablet  (Patient not taking: Reported on 05/03/2020)   No facility-administered encounter medications on file as of 05/03/2020.    Allergies (verified) Erythromycin   History: Past Medical History:  Diagnosis Date  . Cataract   . Diabetes mellitus   . Hyperlipidemia   . Hypertension   . Thyroid disease    Past Surgical History:  Procedure Laterality Date  . AV FISTULA PLACEMENT Left 10/2019  . CATARACT EXTRACTION    . COLONOSCOPY  2009  . TONSILLECTOMY  1973   Family History  Problem Relation Age of Onset  . Alzheimer's disease Mother   . Coronary artery disease Mother   . Hyperlipidemia Mother   .  Obesity Mother   . Hypertension Father   . Colon cancer Neg Hx    Social History   Socioeconomic History  . Marital status: Divorced    Spouse name: Not on file  . Number of children: Not on file  . Years of education: Not on file  . Highest education level: Not on file  Occupational History  . Occupation: retired  Tobacco Use  . Smoking status: Never Smoker  . Smokeless tobacco: Never Used  Vaping Use  . Vaping Use:  Never used  Substance and Sexual Activity  . Alcohol use: No  . Drug use: No  . Sexual activity: Not Currently  Other Topics Concern  . Not on file  Social History Narrative  . Not on file   Social Determinants of Health   Financial Resource Strain: Low Risk   . Difficulty of Paying Living Expenses: Not hard at all  Food Insecurity: No Food Insecurity  . Worried About Charity fundraiser in the Last Year: Never true  . Ran Out of Food in the Last Year: Never true  Transportation Needs: No Transportation Needs  . Lack of Transportation (Medical): No  . Lack of Transportation (Non-Medical): No  Physical Activity: Inactive  . Days of Exercise per Week: 0 days  . Minutes of Exercise per Session: 0 min  Stress: No Stress Concern Present  . Feeling of Stress : Not at all  Social Connections:   . Frequency of Communication with Friends and Family: Not on file  . Frequency of Social Gatherings with Friends and Family: Not on file  . Attends Religious Services: Not on file  . Active Member of Clubs or Organizations: Not on file  . Attends Archivist Meetings: Not on file  . Marital Status: Not on file    Tobacco Counseling Counseling given: Not Answered   Clinical Intake:  Pre-visit preparation completed: Yes  Pain : No/denies pain     Nutritional Status: BMI > 30  Obese Nutritional Risks: None Diabetes: Yes  How often do you need to have someone help you when you read instructions, pamphlets, or other written materials from your doctor or pharmacy?: 1 - Never What is the last grade level you completed in school?: master's degree  Diabetic? Yes Nutrition Risk Assessment:  Has the patient had any N/V/D within the last 2 months?  No  Does the patient have any non-healing wounds?  No  Has the patient had any unintentional weight loss or weight gain?  No   Diabetes:  Is the patient diabetic?  Yes  If diabetic, was a CBG obtained today?  No  Did the patient  bring in their glucometer from home?  No  How often do you monitor your CBG's? 4 times weekly.   Financial Strains and Diabetes Management:  Are you having any financial strains with the device, your supplies or your medication? No .  Does the patient want to be seen by Chronic Care Management for management of their diabetes?  No  Would the patient like to be referred to a Nutritionist or for Diabetic Management?  No   Diabetic Exams:  Diabetic Eye Exam: Completed 02/16/2020 Diabetic Foot Exam: Completed today  Interpreter Needed?: No  Information entered by :: NAllen LPN   Activities of Daily Living In your present state of health, do you have any difficulty performing the following activities: 05/03/2020  Hearing? N  Vision? N  Difficulty concentrating or making decisions? Y  Comment  sometimes  Walking or climbing stairs? N  Dressing or bathing? N  Doing errands, shopping? N  Preparing Food and eating ? N  Using the Toilet? N  In the past six months, have you accidently leaked urine? N  Do you have problems with loss of bowel control? N  Managing your Medications? N  Managing your Finances? N  Some recent data might be hidden    Patient Care Team: Glendale Chard, MD as PCP - General (Internal Medicine) Rex Kras, Claudette Stapler, RN as Case Manager  Indicate any recent Medical Services you may have received from other than [facility] providers in the past year (date may be approximate).     Assessment:   This is a routine wellness examination for Aldena.  Hearing/Vision screen  Hearing Screening   125Hz  250Hz  500Hz  1000Hz  2000Hz  3000Hz  4000Hz  6000Hz  8000Hz   Right ear:           Left ear:           Vision Screening Comments: Regular eye exams, Dr. Baird Cancer  Dietary issues and exercise activities discussed: Current Exercise Habits: The patient does not participate in regular exercise at present  Goals    .  I would like to control my diabetes (pt-stated)      Current Barriers:   . Diabetes: T2DM; most recent A1c 9.9% on 02/23/19 (Increase from 7.4% on 08/19/2018) . Current antihyperglycemic regimen: Levemir 20 units qHS (Goal is to transition to [place] 20 units qHS for better/optimal glucose control, patient is hesitant to switch due to concerns after reading Tresiba's package insert.   Reassured patient that Tyler Aas is safe and effective if taken as prescribed by her MD.  Discussed negative outcomes occur when blood sugar is not controlled. Patient reports picking up her [place] and needles and self administered 11 u for non-fasting cbg finding of 255 today. Patient shared understanding of need for close self health management of her diabetes, especially in light of her renal disease and need for peritoneal dialysis/exchanges.  Marland Kitchen denies hypoglycemic symptoms, denies hyperglycemic symptoms . Current meal patterns: increased carb intake . Current exercise: walking . Current blood glucose readings: reported FBG 150-200, discussed increasing insulin or switching to [place].  Patient will report back next week with blood sugars.  She also has PCP follow up this month. . Cardiovascular risk reduction: o Current hypertensive regimen: amlodipine, metoprolol, hydralazine o Current hyperlipidemia regimen: atorvastatin 80mg  daily (last filled 02/23/19 for #90)  Pharmacist Clinical Goal(s):  Marland Kitchen Over the next 90 days, patient with work with PharmD and primary care provider to address optimization of medication management of chronic conditions.  Interventions: . Interviewed patient re: medication management of DM - she confirmed that she picked up her Levimir insulin pens and needles and is taking her Levimir as prescribed . Reviewed & discussed the following diabetes-related information with patient: o Continue checking blood sugars as directed - patient confirms today (04/26/19) that she is checking cbg's daily as advised o Follow ADA recommended "diabetes-friendly" diet  (reviewed  healthy snack/food options) o Continue taking all medications as prescribed by provider  Patient Self Care Activities:  . Patient will check blood glucose daily, document, and provide at future appointments . Patient will focus on medication adherence by taking insulin as prescribed and participating in  . Patient will take medications as prescribed . Patient will contact provider with any episodes of hypoglycemia . Patient will report any questions or concerns to provider   Please see past updates related  to this goal by clicking on the "Past Updates" button in the selected goal      .  Patient Stated      02/23/2019, to make an appointment for the dentist    .  Patient Stated (pt-stated)      Current Barriers:  . Diabetes: T2DM; most recent A1c 9.9% on 02/23/19.  A1c has increased since 05/2018 when it was 7.4 . Current antihyperglycemic regimen: Levemir 20 units (will be switching to [place]); AccuCheck glucometer . Denies hypoglycemic symptoms; Denies hyperglycemic symptoms . Current exercise: walking as able . Current blood glucose readings: FBG 150-200 . Cardiovascular risk reduction: o Current hypertensive/CHF regimen: amlodipine 5mg , metoprolol XL 100mg , hydralazine 25mg  TID; torsemide, metolazone o Current hyperlipidemia regimen: atorvastatin 80mg  daily  Pharmacist Clinical Goal(s):  Marland Kitchen Over the next 90 days, patient with work with PharmD and primary care provider to address optimization of medication management of chronic conditions  Interventions: Face to face clinic visit completed with Ms. Oka on 02/23/19 . Comprehensive medication review performed.  Reviewed medication fill history via insurance claims data confirming patient appears compliant with having his medications filled on time as prescribed by provider. . Reviewed & discussed the following diabetes-related information with patient: o Continue checking blood sugars as directed o Follow ADA recommended  "diabetes-friendly" diet  (reviewed healthy snack/food options) o Discussed insulin injection technique o Reviewed medication purpose/side effects o Encouraged patient to continue taking all medications as prescribed by provider   Patient Self Care Activities:  . Patient will check blood glucose daily in the AM , document, and provide at future appointments . Patient will focus on medication adherence by continuing to take all medications as prescribed; blood sugars at goal  . Patient will take medications as prescribed . Patient will contact provider with any episodes of hypoglycemia . Patient will report any questions or concerns to provider   Initial goal documentation     .  Patient Stated      05/03/2020, wants to lose 25 pounds      Depression Screen PHQ 2/9 Scores 05/03/2020 02/23/2019 12/30/2018 08/19/2018 05/19/2018  PHQ - 2 Score 0 0 0 0 0  PHQ- 9 Score - 1 - - -    Fall Risk Fall Risk  05/03/2020 02/23/2019 12/30/2018 08/19/2018 05/19/2018  Falls in the past year? 1 0 0 0 No  Comment tripped on uneven pavement - - - -  Number falls in past yr: 0 - - - -  Injury with Fall? 0 - - - -  Risk for fall due to : History of fall(s);Medication side effect Medication side effect - - -  Follow up Falls evaluation completed;Education provided;Falls prevention discussed Falls evaluation completed;Education provided;Falls prevention discussed - - -    Any stairs in or around the home? Yes  If so, are there any without handrails? No  Home free of loose throw rugs in walkways, pet beds, electrical cords, etc? Yes  Adequate lighting in your home to reduce risk of falls? Yes   ASSISTIVE DEVICES UTILIZED TO PREVENT FALLS:  Life alert? No  Use of a cane, walker or w/c? No  Grab bars in the bathroom? No  Shower chair or bench in shower? Yes  Elevated toilet seat or a handicapped toilet? No   TIMED UP AND GO:  Was the test performed? No . .   Gait steady and fast without use of  assistive device  Cognitive Function:     6CIT Screen 05/03/2020 02/23/2019  What Year? 0 points 0 points  What month? 0 points 0 points  What time? 0 points 0 points  Count back from 20 0 points 0 points  Months in reverse 0 points 0 points  Repeat phrase 0 points 0 points  Total Score 0 0    Immunizations Immunization History  Administered Date(s) Administered  . DTaP 06/13/2013  . Influenza, High Dose Seasonal PF 05/19/2018  . Janssen (J&J) SARS-COV-2 Vaccination 10/15/2019    TDAP status: Up to date Flu Vaccine status: Declined, Education has been provided regarding the importance of this vaccine but patient still declined. Advised may receive this vaccine at local pharmacy or Health Dept. Aware to provide a copy of the vaccination record if obtained from local pharmacy or Health Dept. Verbalized acceptance and understanding. Pneumococcal vaccine status: Declined,  Education has been provided regarding the importance of this vaccine but patient still declined. Advised may receive this vaccine at local pharmacy or Health Dept. Aware to provide a copy of the vaccination record if obtained from local pharmacy or Health Dept. Verbalized acceptance and understanding.  Covid-19 vaccine status: Completed vaccines  Qualifies for Shingles Vaccine? Yes   Zostavax completed No   Shingrix Completed?: No.    Education has been provided regarding the importance of this vaccine. Patient has been advised to call insurance company to determine out of pocket expense if they have not yet received this vaccine. Advised may also receive vaccine at local pharmacy or Health Dept. Verbalized acceptance and understanding.  Screening Tests Health Maintenance  Topic Date Due  . HEMOGLOBIN A1C  08/26/2019  . FOOT EXAM  02/23/2020  . INFLUENZA VACCINE  10/25/2020 (Originally 02/26/2020)  . PNA vac Low Risk Adult (1 of 2 - PCV13) 05/03/2021 (Originally 08/29/2014)  . OPHTHALMOLOGY EXAM  02/15/2021  .  MAMMOGRAM  05/31/2021  . TETANUS/TDAP  06/29/2022  . COLONOSCOPY  05/24/2024  . DEXA SCAN  Completed  . COVID-19 Vaccine  Completed  . Hepatitis C Screening  Completed    Health Maintenance  Health Maintenance Due  Topic Date Due  . HEMOGLOBIN A1C  08/26/2019  . FOOT EXAM  02/23/2020    Colorectal cancer screening: Completed 05/24/2014. Repeat every 10 years Mammogram status: Completed 06/01/2019. Repeat every year Bone Density status: Completed 05/24/2018.   Lung Cancer Screening: (Low Dose CT Chest recommended if Age 6-80 years, 30 pack-year currently smoking OR have quit w/in 15years.) does not qualify.   Lung Cancer Screening Referral: no  Additional Screening:  Hepatitis C Screening: does qualify; Completed 11/06/2016  Vision Screening: Recommended annual ophthalmology exams for early detection of glaucoma and other disorders of the eye. Is the patient up to date with their annual eye exam?  Yes  Who is the provider or what is the name of the office in which the patient attends annual eye exams? Dr. Baird Cancer If pt is not established with a provider, would they like to be referred to a provider to establish care? No .   Dental Screening: Recommended annual dental exams for proper oral hygiene  Community Resource Referral / Chronic Care Management: CRR required this visit?  No   CCM required this visit?  No      Plan:     I have personally reviewed and noted the following in the patient's chart:   . Medical and social history . Use of alcohol, tobacco or illicit drugs  . Current medications and supplements . Functional ability and status . Nutritional status . Physical  activity . Advanced directives . List of other physicians . Hospitalizations, surgeries, and ER visits in previous 12 months . Vitals . Screenings to include cognitive, depression, and falls . Referrals and appointments  In addition, I have reviewed and discussed with patient certain  preventive protocols, quality metrics, and best practice recommendations. A written personalized care plan for preventive services as well as general preventive health recommendations were provided to patient.     Kellie Simmering, LPN   [id number]   Nurse Notes:

## 2020-05-03 NOTE — Patient Instructions (Signed)
Health Maintenance, Female Adopting a healthy lifestyle and getting preventive care are important in promoting health and wellness. Ask your health care provider about:  The right schedule for you to have regular tests and exams.  Things you can do on your own to prevent diseases and keep yourself healthy. What should I know about diet, weight, and exercise? Eat a healthy diet   Eat a diet that includes plenty of vegetables, fruits, low-fat dairy products, and lean protein.  Do not eat a lot of foods that are high in solid fats, added sugars, or sodium. Maintain a healthy weight Body mass index (BMI) is used to identify weight problems. It estimates body fat based on height and weight. Your health care provider can help determine your BMI and help you achieve or maintain a healthy weight. Get regular exercise Get regular exercise. This is one of the most important things you can do for your health. Most adults should:  Exercise for at least 150 minutes each week. The exercise should increase your heart rate and make you sweat (moderate-intensity exercise).  Do strengthening exercises at least twice a week. This is in addition to the moderate-intensity exercise.  Spend less time sitting. Even light physical activity can be beneficial. Watch cholesterol and blood lipids Have your blood tested for lipids and cholesterol at 70 years of age, then have this test every 5 years. Have your cholesterol levels checked more often if:  Your lipid or cholesterol levels are high.  You are older than 70 years of age.  You are at high risk for heart disease. What should I know about cancer screening? Depending on your health history and family history, you may need to have cancer screening at various ages. This may include screening for:  Breast cancer.  Cervical cancer.  Colorectal cancer.  Skin cancer.  Lung cancer. What should I know about heart disease, diabetes, and high blood  pressure? Blood pressure and heart disease  High blood pressure causes heart disease and increases the risk of stroke. This is more likely to develop in people who have high blood pressure readings, are of African descent, or are overweight.  Have your blood pressure checked: ? Every 3-5 years if you are 18-39 years of age. ? Every year if you are 40 years old or older. Diabetes Have regular diabetes screenings. This checks your fasting blood sugar level. Have the screening done:  Once every three years after age 40 if you are at a normal weight and have a low risk for diabetes.  More often and at a younger age if you are overweight or have a high risk for diabetes. What should I know about preventing infection? Hepatitis B If you have a higher risk for hepatitis B, you should be screened for this virus. Talk with your health care provider to find out if you are at risk for hepatitis B infection. Hepatitis C Testing is recommended for:  Everyone born from 1945 through 1965.  Anyone with known risk factors for hepatitis C. Sexually transmitted infections (STIs)  Get screened for STIs, including gonorrhea and chlamydia, if: ? You are sexually active and are younger than 70 years of age. ? You are older than 70 years of age and your health care provider tells you that you are at risk for this type of infection. ? Your sexual activity has changed since you were last screened, and you are at increased risk for chlamydia or gonorrhea. Ask your health care provider if   you are at risk.  Ask your health care provider about whether you are at high risk for HIV. Your health care provider may recommend a prescription medicine to help prevent HIV infection. If you choose to take medicine to prevent HIV, you should first get tested for HIV. You should then be tested every 3 months for as long as you are taking the medicine. Pregnancy  If you are about to stop having your period (premenopausal) and  you may become pregnant, seek counseling before you get pregnant.  Take 400 to 800 micrograms (mcg) of folic acid every day if you become pregnant.  Ask for birth control (contraception) if you want to prevent pregnancy. Osteoporosis and menopause Osteoporosis is a disease in which the bones lose minerals and strength with aging. This can result in bone fractures. If you are 65 years old or older, or if you are at risk for osteoporosis and fractures, ask your health care provider if you should:  Be screened for bone loss.  Take a calcium or vitamin D supplement to lower your risk of fractures.  Be given hormone replacement therapy (HRT) to treat symptoms of menopause. Follow these instructions at home: Lifestyle  Do not use any products that contain nicotine or tobacco, such as cigarettes, e-cigarettes, and chewing tobacco. If you need help quitting, ask your health care provider.  Do not use street drugs.  Do not share needles.  Ask your health care provider for help if you need support or information about quitting drugs. Alcohol use  Do not drink alcohol if: ? Your health care provider tells you not to drink. ? You are pregnant, may be pregnant, or are planning to become pregnant.  If you drink alcohol: ? Limit how much you use to 0-1 drink a day. ? Limit intake if you are breastfeeding.  Be aware of how much alcohol is in your drink. In the [place] one drink equals one 12 oz bottle of beer (355 mL), one 5 oz glass of wine (148 mL), or one 1 oz glass of hard liquor (44 mL). General instructions  Schedule regular health, dental, and eye exams.  Stay current with your vaccines.  Tell your health care provider if: ? You often feel depressed. ? You have ever been abused or do not feel safe at home. Summary  Adopting a healthy lifestyle and getting preventive care are important in promoting health and wellness.  Follow your health care provider's instructions about healthy  diet, exercising, and getting tested or screened for diseases.  Follow your health care provider's instructions on monitoring your cholesterol and blood pressure. This information is not intended to replace advice given to you by your health care provider. Make sure you discuss any questions you have with your health care provider. Document Revised: 07/07/2018 Document Reviewed: 07/07/2018 Elsevier Patient Education  2020 [employer]  

## 2020-05-03 NOTE — Progress Notes (Signed)
Rutherford Nail as a scribe for Maximino Greenland, MD.,have documented all relevant documentation on the behalf of Maximino Greenland, MD,as directed by  Maximino Greenland, MD while in the presence of Maximino Greenland, MD. This visit occurred during the SARS-CoV-2 public health emergency.  Safety protocols were in place, including screening questions prior to the visit, additional usage of staff PPE, and extensive cleaning of exam room while observing appropriate contact time as indicated for disinfecting solutions.  Subjective:     Patient ID: Jennifer Payne , female    DOB: 1951/02/09 , 69 y.o.   MRN: [id number]   Chief Complaint  Patient presents with  . Annual Exam  . Diabetes  . Hypertension    She is here today for a full physical exam.  She is followed by GYN for pelvic exams. She has no specific concerns or complaints at this time. She is now receiving hemodialysis.   Diabetes She presents for her follow-up diabetic visit. She has type 2 diabetes mellitus. There are no hypoglycemic associated symptoms. Pertinent negatives for hypoglycemia include no dizziness or headaches. Pertinent negatives for diabetes include no fatigue, no polydipsia, no polyphagia and no polyuria. There are no hypoglycemic complications. Risk factors for coronary artery disease include diabetes mellitus, dyslipidemia, hypertension, obesity, post-menopausal and sedentary lifestyle.  Hypertension This is a chronic problem. The current episode started more than 1 year ago. The problem has been gradually improving since onset. The problem is uncontrolled. Pertinent negatives include no headaches.        Past Medical History:  Diagnosis Date  . Cataract   . Diabetes mellitus   . Hyperlipidemia   . Hypertension   . Thyroid disease      Family History  Problem Relation Age of Onset  . Alzheimer's disease Mother   . Coronary artery disease Mother   . Hyperlipidemia Mother   . Obesity Mother   .  Hypertension Father   . Colon cancer Neg Hx      Current Outpatient Medications:  .  amLODipine (NORVASC) 5 MG tablet, , Disp: , Rfl:  .  atorvastatin (LIPITOR) 80 MG tablet, Take 1 tablet by mouth once daily, Disp: 90 tablet, Rfl: 0 .  BD PEN NEEDLE NANO U/F 32G X 4 MM MISC, USE AS DIRECTED WITH LEVEMIR PEN, Disp: 100 each, Rfl: 0 .  calcitRIOL (ROCALTROL) 0.5 MCG capsule, Take 0.5 mcg by mouth daily., Disp: , Rfl:  .  clobetasol ointment (TEMOVATE) 2.95 %, Apply 1 application topically 2 (two) times daily. (Patient not taking: Reported on 05/03/2020), Disp: 30 g, Rfl: 0 .  Continuous Blood Gluc Receiver (FREESTYLE LIBRE 14 DAY READER) DEVI, Inject 1 each into the skin 4 (four) times daily. To check blood sugars dx: e11.22, Disp: 1 Device, Rfl: 1 .  Continuous Blood Gluc Sensor (FREESTYLE LIBRE 14 DAY SENSOR) MISC, Inject 1 each into the skin QID. Dx:e11.22, Disp: 3 each, Rfl: 1 .  glucose blood (ONETOUCH VERIO) test strip, Use as instructed to check blood sugars  3 times per day dx: e11.65, Disp: 100 each, Rfl: 2 .  hydrALAZINE (APRESOLINE) 25 MG tablet, , Disp: , Rfl:  .  LEVEMIR FLEXTOUCH 100 UNIT/ML Pen, INJECT 20 UNITS SUBCUTANEOUSLY ONCE DAILY AT BEDTIME (Patient not taking: Reported on 05/03/2020), Disp: 6 mL, Rfl: 0 .  levothyroxine (EUTHYROX) 88 MCG tablet, Take 1 tablet (88 mcg total) by mouth daily., Disp: 90 tablet, Rfl: 0 .  metolazone (ZAROXOLYN) 2.5 MG tablet, Monday,  Wednesday, Friday (Patient not taking: Reported on 05/03/2020), Disp: , Rfl:  .  metoprolol succinate (TOPROL-XL) 100 MG 24 hr tablet, TAKE 1 TABLET BY MOUTH ONCE DAILY IN THE MORNING (Patient not taking: Reported on 05/03/2020), Disp: , Rfl:  .  OneTouch Delica Lancets 33G MISC, Use as instructed to check blood sugars  3 times per day dx: e11.65, Disp: 100 each, Rfl: 2 .  torsemide (DEMADEX) 20 MG tablet, , Disp: , Rfl:    Allergies  Allergen Reactions  . Erythromycin Rash      The patient states she uses post  menopausal status for birth control. Last LMP was No LMP recorded. Patient is postmenopausal.. Negative for Dysmenorrhea. Negative for: breast discharge, breast lump(s), breast pain and breast self exam. Associated symptoms include abnormal vaginal bleeding. Pertinent negatives include abnormal bleeding (hematology), anxiety, decreased libido, depression, difficulty falling sleep, dyspareunia, history of infertility, nocturia, sexual dysfunction, sleep disturbances, urinary incontinence, urinary urgency, vaginal discharge and vaginal itching. Diet regular.The patient states her exercise level is  intermittent.  . The patient's tobacco use is:  Social History   Tobacco Use  Smoking Status Never Smoker  Smokeless Tobacco Never Used  . She has been exposed to passive smoke. The patient's alcohol use is:  Social History   Substance and Sexual Activity  Alcohol Use No    Review of Systems  Constitutional: Negative.  Negative for fatigue.  HENT: Negative.   Eyes: Negative.   Respiratory: Negative.   Cardiovascular: Negative.   Gastrointestinal: Negative.   Endocrine: Negative for polydipsia, polyphagia and polyuria.  Musculoskeletal: Positive for arthralgias.       She c/o pain in her toes. Not sure what is causing her sx. Sometimes makes wearing shoes uncomfortable. Denies fall/trauma.   Skin: Negative.   Neurological: Negative for dizziness and headaches.  Psychiatric/Behavioral: Negative.      Today's Vitals   05/03/20 1503  BP: 130/64  Pulse: 78  Temp: 98.1 F (36.7 C)  TempSrc: Oral  SpO2: 96%  Weight: 222 lb (100.7 kg)  Height: 5' 7" (1.702 m)   Body mass index is 34.77 kg/m.   Wt Readings from Last 3 Encounters:  05/03/20 222 lb (100.7 kg)  05/03/20 222 lb 3.2 oz (100.8 kg)  02/23/19 206 lb 12.8 oz (93.8 kg)    Objective:  Physical Exam Vitals and nursing note reviewed.  Constitutional:      Appearance: Normal appearance. She is obese.  HENT:     Head:  Normocephalic and atraumatic.     Right Ear: Tympanic membrane, ear canal and external ear normal.     Left Ear: Tympanic membrane, ear canal and external ear normal.     Nose:     Comments: Deferred, masked    Mouth/Throat:     Comments: Deferred, masked Eyes:     Extraocular Movements: Extraocular movements intact.     Conjunctiva/sclera: Conjunctivae normal.     Pupils: Pupils are equal, round, and reactive to light.  Cardiovascular:     Rate and Rhythm: Normal rate and regular rhythm.     Pulses:          Dorsalis pedis pulses are 1+ on the right side and 1+ on the left side.     Heart sounds: Normal heart sounds.     Comments: Fistula left UE Pulmonary:     Effort: Pulmonary effort is normal.     Breath sounds: Normal breath sounds.  Chest:     Breasts: Tanner Score   is 5.        Right: Normal.        Left: Normal.  Abdominal:     General: Bowel sounds are normal.     Palpations: Abdomen is soft.     Comments: Rounded, soft  Genitourinary:    Comments: deferred Musculoskeletal:        General: Normal range of motion.     Cervical back: Normal range of motion and neck supple.     Right lower leg: Edema present.     Left lower leg: Edema present.  Feet:     Right foot:     Protective Sensation: 5 sites tested. 5 sites sensed.     Skin integrity: Callus and dry skin present.     Toenail Condition: Right toenails are long.     Left foot:     Protective Sensation: 5 sites tested. 5 sites sensed.     Skin integrity: Callus and dry skin present.     Toenail Condition: Left toenails are long.  Skin:    General: Skin is warm and dry.  Neurological:     General: No focal deficit present.     Mental Status: She is alert and oriented to person, place, and time.  Psychiatric:        Mood and Affect: Mood normal.        Behavior: Behavior normal.         Assessment And Plan:     1. Encounter for general adult medical examination without abnormal findings Comments: A   full exam was performed. Importance of monthly self breast exams was discussed with the patient.  PATIENT IS ADVISED TO GET 30-45 MINUTES REGULAR EXERCISE NO LESS THAN FOUR TO FIVE DAYS PER WEEK - BOTH WEIGHTBEARING EXERCISES AND AEROBIC ARE RECOMMENDED.  PATIENT IS ADVISED TO FOLLOW A HEALTHY DIET WITH AT LEAST SIX FRUITS/VEGGIES PER DAY, DECREASE INTAKE OF RED MEAT, AND TO INCREASE FISH INTAKE TO TWO DAYS PER WEEK.  MEATS/FISH SHOULD NOT BE FRIED, BAKED OR BROILED IS PREFERABLE.  I SUGGEST WEARING SPF 50 SUNSCREEN ON EXPOSED PARTS AND ESPECIALLY WHEN IN THE DIRECT SUNLIGHT FOR AN EXTENDED PERIOD OF TIME.  PLEASE AVOID FAST FOOD RESTAURANTS AND INCREASE YOUR WATER INTAKE.   2. 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  I DISCUSSED WITH THE PATIENT AT LENGTH REGARDING THE GOALS OF GLYCEMIC CONTROL AND POSSIBLE LONG-TERM COMPLICATIONS.  I  ALSO STRESSED THE IMPORTANCE OF COMPLIANCE WITH HOME GLUCOSE MONITORING, DIETARY RESTRICTIONS INCLUDING AVOIDANCE OF SUGARY DRINKS/PROCESSED FOODS,  ALONG WITH REGULAR EXERCISE.  I  ALSO STRESSED THE IMPORTANCE OF ANNUAL EYE EXAMS, SELF FOOT CARE AND COMPLIANCE WITH OFFICE VISITS.   - CMP14+EGFR - CBC - Hemoglobin A1c - Lipid panel - Protein electrophoresis, serum - Parathyroid Hormone, Intact w/Ca  3. Proliferative diabetic retinopathy associated with type 2 diabetes mellitus, unspecified laterality, unspecified proliferative retinopathy type ([place]) Comments: Chronic.   4. Hypertensive nephropathy Comments: Chronic, controlled. She will continue with current meds. She is encouraged to follow low salt diet. EKG performed, NSR w/ poor R -wave progression, nonspecific, consider old anterior infarct - no new changes. She will rto in six months for re-evaluation.  - EKG 12-Lead  5. Primary hypothyroidism Comments: I will check thyroid panel and adjust meds as needed.  - TSH - T4, Free  6. Pain in toes of  both feet Comments: I wil check uric acid level; however, I think  her sx may be due to osteoarthritis. - Uric acid  7. Class 1 obesity due to excess calories with serious comorbidity and body mass index (BMI) of 34.0 to 34.9 in adult Comments: She is encouraged to strive for BMI less than 30 to decrease cardiac risk.  Advised to aim for at least 150 minutes of exercise per week.      Patient was given opportunity to ask questions. Patient verbalized understanding of the plan and was able to repeat key elements of the plan. All questions were answered to their satisfaction.   Maximino Greenland, MD   I, Maximino Greenland, MD, have reviewed all documentation for this visit. The documentation on 05/20/20 for the exam, diagnosis, procedures, and orders are all accurate and complete.  THE PATIENT IS ENCOURAGED TO PRACTICE SOCIAL DISTANCING DUE TO THE COVID-19 PANDEMIC.

## 2020-05-03 NOTE — Patient Instructions (Signed)
Jennifer Payne , Thank you for taking time to come for your Medicare Wellness Visit. I appreciate your ongoing commitment to your health goals. Please review the following plan we discussed and let me know if I can assist you in the future.   Screening recommendations/referrals: Colonoscopy: completed 05/24/2014 Mammogram: completed 06/01/2019 Bone Density: completed 05/24/2018 Recommended yearly ophthalmology/optometry visit for glaucoma screening and checkup Recommended yearly dental visit for hygiene and checkup  Vaccinations: Influenza vaccine: decline at this time Pneumococcal vaccine: decline at this time Tdap vaccine: completed 06/29/2012 Shingles vaccine: discussed   Covid-19: 10/15/2019  Advanced directives: Please bring a copy of your POA (Power of Attorney) and/or Living Will to your next appointment.   Conditions/risks identified: none  Next appointment: Follow up in one year for your annual wellness visit 70/27/2022 at 2:00   Preventive Care 70 Years and Older, Female Preventive care refers to lifestyle choices and visits with your health care provider that can promote health and wellness. What does preventive care include?  A yearly physical exam. This is also called an annual well check.  Dental exams once or twice a year.  Routine eye exams. Ask your health care provider how often you should have your eyes checked.  Personal lifestyle choices, including:  Daily care of your teeth and gums.  Regular physical activity.  Eating a healthy diet.  Avoiding tobacco and drug use.  Limiting alcohol use.  Practicing safe sex.  Taking low-dose aspirin every day.  Taking vitamin and mineral supplements as recommended by your health care provider. What happens during an annual well check? The services and screenings done by your health care provider during your annual well check will depend on your age, overall health, lifestyle risk factors, and family history of  disease. Counseling  Your health care provider may ask you questions about your:  Alcohol use.  Tobacco use.  Drug use.  Emotional well-being.  Home and relationship well-being.  Sexual activity.  Eating habits.  History of falls.  Memory and ability to understand (cognition).  Work and work Statistician.  Reproductive health. Screening  You may have the following tests or measurements:  Height, weight, and BMI.  Blood pressure.  Lipid and cholesterol levels. These may be checked every 5 years, or more frequently if you are over 70 years old.  Skin check.  Lung cancer screening. You may have this screening every year starting at age 70 if you have a 30-pack-year history of smoking and currently smoke or have quit within the past 15 years.  Fecal occult blood test (FOBT) of the stool. You may have this test every year starting at age 70.  Flexible sigmoidoscopy or colonoscopy. You may have a sigmoidoscopy every 5 years or a colonoscopy every 10 years starting at age 70.  Hepatitis C blood test.  Hepatitis B blood test.  Sexually transmitted disease (STD) testing.  Diabetes screening. This is done by checking your blood sugar (glucose) after you have not eaten for a while (fasting). You may have this done every 1-3 years.  Bone density scan. This is done to screen for osteoporosis. You may have this done starting at age 70.  Mammogram. This may be done every 1-2 years. Talk to your health care provider about how often you should have regular mammograms. Talk with your health care provider about your test results, treatment options, and if necessary, the need for more tests. Vaccines  Your health care provider may recommend certain vaccines, such as:  Influenza  vaccine. This is recommended every year.  Tetanus, diphtheria, and acellular pertussis (Tdap, Td) vaccine. You may need a Td booster every 10 years.  Zoster vaccine. You may need this after age  70.  Pneumococcal 13-valent conjugate (PCV13) vaccine. One dose is recommended after age 1.  Pneumococcal polysaccharide (PPSV23) vaccine. One dose is recommended after age 70. Talk to your health care provider about which screenings and vaccines you need and how often you need them. This information is not intended to replace advice given to you by your health care provider. Make sure you discuss any questions you have with your health care provider. Document Released: 08/10/2015 Document Revised: 04/02/2016 Document Reviewed: 05/15/2015 Elsevier Interactive Patient Education  2017 [place] Prevention in the Home Falls can cause injuries. They can happen to people of all ages. There are many things you can do to make your home safe and to help prevent falls. What can I do on the outside of my home?  Regularly fix the edges of walkways and driveways and fix any cracks.  Remove anything that might make you trip as you walk through a door, such as a raised step or threshold.  Trim any bushes or trees on the path to your home.  Use bright outdoor lighting.  Clear any walking paths of anything that might make someone trip, such as rocks or tools.  Regularly check to see if handrails are loose or broken. Make sure that both sides of any steps have handrails.  Any raised decks and porches should have guardrails on the edges.  Have any leaves, snow, or ice cleared regularly.  Use sand or salt on walking paths during winter.  Clean up any spills in your garage right away. This includes oil or grease spills. What can I do in the bathroom?  Use night lights.  Install grab bars by the toilet and in the tub and shower. Do not use towel bars as grab bars.  Use non-skid mats or decals in the tub or shower.  If you need to sit down in the shower, use a plastic, non-slip stool.  Keep the floor dry. Clean up any water that spills on the floor as soon as it happens.  Remove  soap buildup in the tub or shower regularly.  Attach bath mats securely with double-sided non-slip rug tape.  Do not have throw rugs and other things on the floor that can make you trip. What can I do in the bedroom?  Use night lights.  Make sure that you have a light by your bed that is easy to reach.  Do not use any sheets or blankets that are too big for your bed. They should not hang down onto the floor.  Have a firm chair that has side arms. You can use this for support while you get dressed.  Do not have throw rugs and other things on the floor that can make you trip. What can I do in the kitchen?  Clean up any spills right away.  Avoid walking on wet floors.  Keep items that you use a lot in easy-to-reach places.  If you need to reach something above you, use a strong step stool that has a grab bar.  Keep electrical cords out of the way.  Do not use floor polish or wax that makes floors slippery. If you must use wax, use non-skid floor wax.  Do not have throw rugs and other things on the floor that can  make you trip. What can I do with my stairs?  Do not leave any items on the stairs.  Make sure that there are handrails on both sides of the stairs and use them. Fix handrails that are broken or loose. Make sure that handrails are as long as the stairways.  Check any carpeting to make sure that it is firmly attached to the stairs. Fix any carpet that is loose or worn.  Avoid having throw rugs at the top or bottom of the stairs. If you do have throw rugs, attach them to the floor with carpet tape.  Make sure that you have a light switch at the top of the stairs and the bottom of the stairs. If you do not have them, ask someone to add them for you. What else can I do to help prevent falls?  Wear shoes that:  Do not have high heels.  Have rubber bottoms.  Are comfortable and fit you well.  Are closed at the toe. Do not wear sandals.  If you use a  stepladder:  Make sure that it is fully opened. Do not climb a closed stepladder.  Make sure that both sides of the stepladder are locked into place.  Ask someone to hold it for you, if possible.  Clearly mark and make sure that you can see:  Any grab bars or handrails.  First and last steps.  Where the edge of each step is.  Use tools that help you move around (mobility aids) if they are needed. These include:  Canes.  Walkers.  Scooters.  Crutches.  Turn on the lights when you go into a dark area. Replace any light bulbs as soon as they burn out.  Set up your furniture so you have a clear path. Avoid moving your furniture around.  If any of your floors are uneven, fix them.  If there are any pets around you, be aware of where they are.  Review your medicines with your doctor. Some medicines can make you feel dizzy. This can increase your chance of falling. Ask your doctor what other things that you can do to help prevent falls. This information is not intended to replace advice given to you by your health care provider. Make sure you discuss any questions you have with your health care provider. Document Released: 05/10/2009 Document Revised: 12/20/2015 Document Reviewed: 08/18/2014 Elsevier Interactive Patient Education  2017 [employer].

## 2020-05-07 LAB — CMP14+EGFR
ALT: 7 IU/L (ref 0–32)
AST: 7 IU/L (ref 0–40)
Albumin/Globulin Ratio: 1.2 (ref 1.2–2.2)
Albumin: 4.1 g/dL (ref 3.8–4.8)
Alkaline Phosphatase: 161 IU/L — ABNORMAL HIGH (ref 44–121)
BUN/Creatinine Ratio: 4 — ABNORMAL LOW (ref 12–28)
BUN: 34 mg/dL — ABNORMAL HIGH (ref 8–27)
Bilirubin Total: 0.3 mg/dL (ref 0.0–1.2)
CO2: 25 mmol/L (ref 20–29)
Calcium: 9.1 mg/dL (ref 8.7–10.3)
Chloride: 94 mmol/L — ABNORMAL LOW (ref 96–106)
Creatinine, Ser: 7.57 mg/dL — ABNORMAL HIGH (ref 0.57–1.00)
GFR calc Af Amer: 6 mL/min/{1.73_m2} — ABNORMAL LOW (ref >59)
GFR calc non Af Amer: 5 mL/min/{1.73_m2} — ABNORMAL LOW (ref >59)
Globulin, Total: 3.5 g/dL (ref 1.5–4.5)
Glucose: 119 mg/dL — ABNORMAL HIGH (ref 65–99)
Potassium: 5.1 mmol/L (ref 3.5–5.2)
Sodium: 136 mmol/L (ref 134–144)
Total Protein: 7.6 g/dL (ref 6.0–8.5)

## 2020-05-07 LAB — HEMOGLOBIN A1C
Est. average glucose Bld gHb Est-mCnc: 137 mg/dL
Hgb A1c MFr Bld: 6.4 % — ABNORMAL HIGH (ref 4.8–5.6)

## 2020-05-07 LAB — CBC
Hematocrit: 41.9 % (ref 34.0–46.6)
Hemoglobin: 13 g/dL (ref 11.1–15.9)
MCH: 28.3 pg (ref 26.6–33.0)
MCHC: 31 g/dL — ABNORMAL LOW (ref 31.5–35.7)
MCV: 91 fL (ref 79–97)
Platelets: 194 10*3/uL (ref 150–450)
RBC: 4.59 x10E6/uL (ref 3.77–5.28)
RDW: 12.6 % (ref 11.7–15.4)
WBC: 5.5 10*3/uL (ref 3.4–10.8)

## 2020-05-07 LAB — LIPID PANEL
Chol/HDL Ratio: 3.2 ratio (ref 0.0–4.4)
Cholesterol, Total: 185 mg/dL (ref 100–199)
HDL: 58 mg/dL (ref >39)
LDL Chol Calc (NIH): 110 mg/dL — ABNORMAL HIGH (ref 0–99)
Triglycerides: 95 mg/dL (ref 0–149)
VLDL Cholesterol Cal: 17 mg/dL (ref 5–40)

## 2020-05-07 LAB — PROTEIN ELECTROPHORESIS, SERUM
A/G Ratio: 1.1 (ref 0.7–1.7)
Albumin ELP: 3.9 g/dL (ref 2.9–4.4)
Alpha 1: 0.2 g/dL (ref 0.0–0.4)
Alpha 2: 0.6 g/dL (ref 0.4–1.0)
Beta: 0.9 g/dL (ref 0.7–1.3)
Gamma Globulin: 2 g/dL — ABNORMAL HIGH (ref 0.4–1.8)
Globulin, Total: 3.7 g/dL (ref 2.2–3.9)

## 2020-05-07 LAB — TSH: TSH: 1.71 u[IU]/mL (ref 0.450–4.500)

## 2020-05-07 LAB — URIC ACID: Uric Acid: 3.7 mg/dL (ref 3.0–7.2)

## 2020-05-07 LAB — PTH, INTACT AND CALCIUM: PTH: 356 pg/mL — ABNORMAL HIGH (ref 15–65)

## 2020-05-07 LAB — T4, FREE: Free T4: 1.4 ng/dL (ref 0.82–1.77)

## 2020-05-08 ENCOUNTER — Other Ambulatory Visit: Payer: Self-pay | Admitting: Internal Medicine

## 2020-05-20 DIAGNOSIS — M79674 Pain in right toe(s): Secondary | ICD-10-CM | POA: Insufficient documentation

## 2020-05-20 DIAGNOSIS — E6609 Other obesity due to excess calories: Secondary | ICD-10-CM | POA: Insufficient documentation

## 2020-05-20 DIAGNOSIS — M79675 Pain in left toe(s): Secondary | ICD-10-CM | POA: Insufficient documentation

## 2020-05-23 DIAGNOSIS — H35371 Puckering of macula, right eye: Secondary | ICD-10-CM | POA: Diagnosis not present

## 2020-05-23 DIAGNOSIS — E113592 Type 2 diabetes mellitus with proliferative diabetic retinopathy without macular edema, left eye: Secondary | ICD-10-CM | POA: Diagnosis not present

## 2020-05-23 DIAGNOSIS — H43821 Vitreomacular adhesion, right eye: Secondary | ICD-10-CM | POA: Diagnosis not present

## 2020-05-23 DIAGNOSIS — E113511 Type 2 diabetes mellitus with proliferative diabetic retinopathy with macular edema, right eye: Secondary | ICD-10-CM | POA: Diagnosis not present

## 2020-05-24 DIAGNOSIS — E669 Obesity, unspecified: Secondary | ICD-10-CM | POA: Diagnosis not present

## 2020-05-24 DIAGNOSIS — Z6834 Body mass index (BMI) 34.0-34.9, adult: Secondary | ICD-10-CM | POA: Diagnosis not present

## 2020-05-24 DIAGNOSIS — E1122 Type 2 diabetes mellitus with diabetic chronic kidney disease: Secondary | ICD-10-CM | POA: Diagnosis not present

## 2020-05-24 DIAGNOSIS — N186 End stage renal disease: Secondary | ICD-10-CM | POA: Diagnosis not present

## 2020-05-24 DIAGNOSIS — Z713 Dietary counseling and surveillance: Secondary | ICD-10-CM | POA: Diagnosis not present

## 2020-05-24 DIAGNOSIS — Z794 Long term (current) use of insulin: Secondary | ICD-10-CM | POA: Diagnosis not present

## 2020-05-24 DIAGNOSIS — E11319 Type 2 diabetes mellitus with unspecified diabetic retinopathy without macular edema: Secondary | ICD-10-CM | POA: Diagnosis not present

## 2020-05-24 DIAGNOSIS — Z01818 Encounter for other preprocedural examination: Secondary | ICD-10-CM | POA: Diagnosis not present

## 2020-05-24 DIAGNOSIS — I120 Hypertensive chronic kidney disease with stage 5 chronic kidney disease or end stage renal disease: Secondary | ICD-10-CM | POA: Diagnosis not present

## 2020-05-24 DIAGNOSIS — Z992 Dependence on renal dialysis: Secondary | ICD-10-CM | POA: Diagnosis not present

## 2020-05-28 DIAGNOSIS — I10 Essential (primary) hypertension: Secondary | ICD-10-CM | POA: Diagnosis not present

## 2020-05-28 DIAGNOSIS — D509 Iron deficiency anemia, unspecified: Secondary | ICD-10-CM | POA: Diagnosis not present

## 2020-05-28 DIAGNOSIS — N2581 Secondary hyperparathyroidism of renal origin: Secondary | ICD-10-CM | POA: Diagnosis not present

## 2020-05-28 DIAGNOSIS — D631 Anemia in chronic kidney disease: Secondary | ICD-10-CM | POA: Diagnosis not present

## 2020-05-28 DIAGNOSIS — N186 End stage renal disease: Secondary | ICD-10-CM | POA: Diagnosis not present

## 2020-06-01 ENCOUNTER — Ambulatory Visit
Admission: RE | Admit: 2020-06-01 | Discharge: 2020-06-01 | Disposition: A | Discharge status: Home / Self Care | Payer: Medicare HMO | Source: Ambulatory Visit | Attending: Internal Medicine | Admitting: Internal Medicine

## 2020-06-01 ENCOUNTER — Other Ambulatory Visit: Payer: Self-pay

## 2020-06-01 ENCOUNTER — Ambulatory Visit: Payer: Medicare HMO

## 2020-06-01 DIAGNOSIS — E039 Hypothyroidism, unspecified: Secondary | ICD-10-CM | POA: Diagnosis not present

## 2020-06-01 DIAGNOSIS — Z1231 Encounter for screening mammogram for malignant neoplasm of breast: Secondary | ICD-10-CM

## 2020-06-01 DIAGNOSIS — E119 Type 2 diabetes mellitus without complications: Secondary | ICD-10-CM | POA: Diagnosis not present

## 2020-06-19 ENCOUNTER — Telehealth: Payer: Medicare HMO

## 2020-06-19 ENCOUNTER — Other Ambulatory Visit: Payer: Self-pay

## 2020-06-19 ENCOUNTER — Ambulatory Visit (INDEPENDENT_AMBULATORY_CARE_PROVIDER_SITE_OTHER): Payer: Medicare HMO

## 2020-06-19 DIAGNOSIS — Z992 Dependence on renal dialysis: Secondary | ICD-10-CM | POA: Diagnosis not present

## 2020-06-19 DIAGNOSIS — N186 End stage renal disease: Secondary | ICD-10-CM

## 2020-06-19 DIAGNOSIS — I120 Hypertensive chronic kidney disease with stage 5 chronic kidney disease or end stage renal disease: Secondary | ICD-10-CM

## 2020-06-19 DIAGNOSIS — E1122 Type 2 diabetes mellitus with diabetic chronic kidney disease: Secondary | ICD-10-CM

## 2020-06-19 DIAGNOSIS — I129 Hypertensive chronic kidney disease with stage 1 through stage 4 chronic kidney disease, or unspecified chronic kidney disease: Secondary | ICD-10-CM

## 2020-06-27 DIAGNOSIS — N2581 Secondary hyperparathyroidism of renal origin: Secondary | ICD-10-CM | POA: Diagnosis not present

## 2020-06-27 DIAGNOSIS — E119 Type 2 diabetes mellitus without complications: Secondary | ICD-10-CM | POA: Diagnosis not present

## 2020-06-27 DIAGNOSIS — I10 Essential (primary) hypertension: Secondary | ICD-10-CM | POA: Diagnosis not present

## 2020-06-27 DIAGNOSIS — D631 Anemia in chronic kidney disease: Secondary | ICD-10-CM | POA: Diagnosis not present

## 2020-06-27 DIAGNOSIS — N186 End stage renal disease: Secondary | ICD-10-CM | POA: Diagnosis not present

## 2020-06-27 DIAGNOSIS — D509 Iron deficiency anemia, unspecified: Secondary | ICD-10-CM | POA: Diagnosis not present

## 2020-06-27 DIAGNOSIS — E039 Hypothyroidism, unspecified: Secondary | ICD-10-CM | POA: Diagnosis not present

## 2020-06-28 NOTE — Chronic Care Management (AMB) (Signed)
Chronic Care Management   Initial Visit Note  06/28/2020 Name: Jennifer Payne MRN: [id number] DOB: 12-05-1950  Referred by: Glendale Chard, MD Reason for referral : Chronic Care Management (Initial CCM RNCM Call )   Jennifer Payne is a 69 y.o. year old female who is a primary care patient of Glendale Chard, MD. The CCM team was consulted for assistance with chronic disease management and care coordination needs related to HTN, DMII and ESRD  Review of patient status, including review of consultants reports, relevant laboratory and other test results, and collaboration with appropriate care team members and the patient's provider was performed as part of comprehensive patient evaluation and provision of chronic care management services.    SDOH (Social Determinants of Health) assessments performed: Yes - no acute needs identified  See Care Plan activities for detailed interventions related to [place] initial CCM RN CM outbound call to patient to assess for CCM needs, a care plan was established.     Medications: Outpatient Encounter Medications as of 06/19/2020  Medication Sig  . amLODipine (NORVASC) 5 MG tablet  (Patient not taking: Reported on 05/03/2020)  . atorvastatin (LIPITOR) 80 MG tablet Take 1 tablet by mouth once daily  . BD PEN NEEDLE NANO U/F 32G X 4 MM MISC USE AS DIRECTED WITH LEVEMIR PEN  . calcitRIOL (ROCALTROL) 0.5 MCG capsule Take 0.5 mcg by mouth daily.  . clobetasol ointment (TEMOVATE) 4.09 % Apply 1 application topically 2 (two) times daily. (Patient not taking: Reported on 05/03/2020)  . Continuous Blood Gluc Receiver (FREESTYLE LIBRE 14 DAY READER) DEVI Inject 1 each into the skin 4 (four) times daily. To check blood sugars dx: e11.22  . Continuous Blood Gluc Sensor (FREESTYLE LIBRE 14 DAY SENSOR) MISC Inject 1 each into the skin QID. Dx:e11.22  . glucose blood (ONETOUCH VERIO) test strip Use as instructed to check blood sugars  3 times per day dx:  e11.65  . hydrALAZINE (APRESOLINE) 25 MG tablet  (Patient not taking: Reported on 05/03/2020)  . LEVEMIR FLEXTOUCH 100 UNIT/ML Pen INJECT 20 UNITS SUBCUTANEOUSLY ONCE DAILY AT BEDTIME (Patient not taking: Reported on 05/03/2020)  . levothyroxine (EUTHYROX) 88 MCG tablet Take 1 tablet (88 mcg total) by mouth daily.  . metolazone (ZAROXOLYN) 2.5 MG tablet Monday, Wednesday, Friday (Patient not taking: Reported on 05/03/2020)  . metoprolol succinate (TOPROL-XL) 100 MG 24 hr tablet TAKE 1 TABLET BY MOUTH ONCE DAILY IN THE MORNING (Patient not taking: Reported on 05/03/2020)  . OneTouch Delica Lancets 81X MISC Use as instructed to check blood sugars  3 times per day dx: e11.65  . torsemide (DEMADEX) 20 MG tablet  (Patient not taking: Reported on 05/03/2020)   No facility-administered encounter medications on file as of 06/19/2020.     Objective:  Lab Results  Component Value Date   HGBA1C 6.4 (H) 05/03/2020   HGBA1C 9.9 (H) 02/23/2019   HGBA1C 7.4 (H) 08/19/2018   Lab Results  Component Value Date   MICROALBUR 150 02/23/2019   LDLCALC 110 (H) 05/03/2020   CREATININE 7.57 (H) 05/03/2020   BP Readings from Last 3 Encounters:  05/03/20 130/64  05/03/20 130/64  02/23/19 112/62    Goals Addressed      Patient Stated   .  COMPLETED: I would like to control my diabetes (pt-stated)        Current Barriers:  . Diabetes: T2DM; most recent A1c 9.9% on 02/23/19 (Increase from 7.4% on 08/19/2018) . Current antihyperglycemic regimen: Levemir 20  units qHS (Goal is to transition to [place] 20 units qHS for better/optimal glucose control, patient is hesitant to switch due to concerns after reading Tresiba's package insert.   Reassured patient that Tyler Aas is safe and effective if taken as prescribed by her MD.  Discussed negative outcomes occur when blood sugar is not controlled. Patient reports picking up her [place] and needles and self administered 11 u for non-fasting cbg finding of 255 today.  Patient shared understanding of need for close self health management of her diabetes, especially in light of her renal disease and need for peritoneal dialysis/exchanges.  Marland Kitchen denies hypoglycemic symptoms, denies hyperglycemic symptoms . Current meal patterns: increased carb intake . Current exercise: walking . Current blood glucose readings: reported FBG 150-200, discussed increasing insulin or switching to [place].  Patient will report back next week with blood sugars.  She also has PCP follow up this month. . Cardiovascular risk reduction: o Current hypertensive regimen: amlodipine, metoprolol, hydralazine o Current hyperlipidemia regimen: atorvastatin 80mg  daily (last filled 02/23/19 for #90)  Pharmacist Clinical Goal(s):  Marland Kitchen Over the next 90 days, patient with work with PharmD and primary care provider to address optimization of medication management of chronic conditions.  Interventions: . Interviewed patient re: medication management of DM - she confirmed that she picked up her Levimir insulin pens and needles and is taking her Levimir as prescribed . Reviewed & discussed the following diabetes-related information with patient: o Continue checking blood sugars as directed - patient confirms today (04/26/19) that she is checking cbg's daily as advised o Follow ADA recommended "diabetes-friendly" diet  (reviewed healthy snack/food options) o Continue taking all medications as prescribed by provider  Patient Self Care Activities:  . Patient will check blood glucose daily, document, and provide at future appointments . Patient will focus on medication adherence by taking insulin as prescribed and participating in  . Patient will take medications as prescribed . Patient will contact provider with any episodes of hypoglycemia . Patient will report any questions or concerns to provider   Please see past updates related to this goal by clicking on the "Past Updates" button in the selected goal       .  Monitor and Manage My Blood Sugar (pt-stated)        Other   .  Obtain Eye Exam        Follow Up Date 09/19/20   - keep appointment with eye doctor - schedule appointment with eye doctor     .  Perform Foot Care        Follow Up Date 09/19/20   - check feet daily for cuts, sores or redness - trim toenails straight across - wash and dry feet carefully every day - wear comfortable, cotton socks - wear comfortable, well-fitting shoes      .  Set My Target A1C        Follow Up Date 09/19/20   - set target A1C      .  Track and Manage My Blood Pressure        Follow Up Date 09/19/20   - check blood pressure 3 times per week (to be completed during HD treatments)       Patient Care Plan: Diabetes Type 2 (Adult)    Problem Identified: Glycemic Management (Diabetes, Type 2)     Goal: Glycemic Management Optimized   Start Date: 06/19/2020  Expected End Date: 09/19/2020  Priority: High  Note:   Objective:  Lab Results  Component Value Date   HGBA1C 6.4 (H) 05/03/2020 .   Lab Results  Component Value Date   CREATININE 7.57 (H) 05/03/2020   CREATININE 8.38 (H) 02/23/2019   CREATININE 4.29 (H) 08/19/2018   Current Barriers:  Marland Kitchen Knowledge Deficits related to basic Diabetes pathophysiology and self care/management  Case Manager Clinical Goal(s):  Marland Kitchen Over the next 90 days, patient will demonstrate improved adherence to prescribed treatment plan for diabetes self care/management as evidenced by:  . daily monitoring and recording of CBG  . adherence to ADA/ carb modified diet . adherence to prescribed medication regimen  Interventions:  . Provided education to patient about basic DM disease process . Reviewed medications with patient and discussed importance of medication adherence . Discussed plans with patient for ongoing care management follow up and provided patient with direct contact information for care management team . Provided patient with written  educational materials related to hypo and hyperglycemia and importance of correct treatment . Advised patient, providing education and rationale, to check cbg 1-2 times daily before meals and record, calling the CCM team and or PCP for findings outside established parameters.   . Educated on daily glycemic control FBS 80-130; after meals <180 . Review mutually-set A1C goal or target range  Patient Goals/Self-Care Activities . Over the next 90 days, patient will:  - Self administers insulin as prescribed Attends all scheduled provider appointments Checks blood sugars as prescribed and utilize hyper and hypoglycemia protocol as needed Adheres to prescribed ADA/carb modified  Follow Up Plan: Telephone follow up appointment with care management team member scheduled for:  09/13/20   Patient Care Plan: Hypertension (Adult)    Problem Identified: Hypertension (Hypertension)     Goal: Hypertension Monitored   Start Date: 06/19/2020  Expected End Date: 09/19/2020  Priority: High  Note:   Objective:  . Last practice recorded BP readings:  BP Readings from Last 3 Encounters:  05/03/20 130/64  05/03/20 130/64  02/23/19 112/62   Current Barriers:  Marland Kitchen Knowledge Deficits related to basic understanding of hypertension pathophysiology and self care management  Case Manager Clinical Goal(s):  Marland Kitchen Over the next 90 days, patient will verbalize understanding of plan for hypertension management Interventions:  . Evaluation of current treatment plan related to hypertension self management and patient's adherence to plan as established by provider. . Reviewed medications with patient and discussed importance of compliance . Discussed plans with patient for ongoing care management follow up and provided patient with direct contact information for care management team . Encourage continued use of home blood pressure monitoring and recording in blood pressure log; include symptoms of hypotension or  potential medication side effects in log  Patient Goals/Self-Care Activities . Over the next 90 days, patient will:  - Self administers medications as prescribed Attends all scheduled provider appointments Calls provider office for new concerns, questions, or BP outside discussed parameters Checks BP and records as discussed Follows a low sodium diet/DASH diet  Follow Up Plan: Telephone follow up appointment with care management team member scheduled for: 09/13/20   Problem Identified: Disease Progression (Hypertension)     Goal: Disease Progression Prevented or Minimized   Start Date: 06/19/2020  Expected End Date: 09/19/2020  Priority: High  Note:   Objective:  . Last practice recorded BP readings:  BP Readings from Last 3 Encounters:  05/03/20 130/64  05/03/20 130/64  02/23/19 112/62   Current Barriers:  Marland Kitchen Knowledge Deficits related to basic understanding of hypertension pathophysiology and self care management  Case Manager Clinical Goal(s):  Marland Kitchen Over the next 90 days, patient will verbalize basic understanding of hypertension disease process and self health management plan as evidenced by patient will demonstrate maintaining target BP  Interventions:  . Evaluation of current treatment plan related to hypertension self management and patient's adherence to plan as established by provider. . Reviewed medications with patient and discussed importance of compliance . Discussed plans with patient for ongoing care management follow up and provided patient with direct contact information for care management team . Advised patient, providing education and rationale, to monitor blood pressure daily and record, calling PCP for findings outside established parameters.   Patient Goals/Self-Care Activities . Over the next 90 days, patient will:  - Self administers medications as prescribed Attends all scheduled provider appointments Calls provider office for new concerns, questions, or  BP outside discussed parameters Checks BP and records as discussed Follows a low sodium diet/DASH diet  Follow Up Plan: Telephone follow up appointment with care management team member scheduled for: 09/13/20    Plan:   Telephone follow up appointment with care management team member scheduled for: 09/13/20  Barb Merino, RN, BSN, CCM Care Management Coordinator [place] [facility]  Direct Phone: [phone]

## 2020-06-28 NOTE — Patient Instructions (Signed)
Visit Information  Goals Addressed      Patient Stated   .  Monitor and Manage My Blood Sugar (pt-stated)        Follow Up Date 09/19/20  - check blood sugar at prescribed times - check blood sugar if I feel it is too high or too low - enter blood sugar readings and medication or insulin into daily log - take the blood sugar log to all doctor visits - take the blood sugar meter to all doctor visits        Other   .  Obtain Eye Exam        Follow Up Date 09/19/20 - keep appointment with eye doctor - schedule appointment with eye doctor      .  Perform Foot Care        Follow Up Date 09/19/20 - check feet daily for cuts, sores or redness - trim toenails straight across - wash and dry feet carefully every day - wear comfortable, cotton socks - wear comfortable, well-fitting shoes     .  Set My Target A1C        Follow Up Date 09/19/20 - set target A1C   .    Marland Kitchen  Track and Manage My Blood Pressure        Follow Up Date 09/19/20 - check blood pressure 3 times per week (to be completed during HD treatments)         Patient Care Plan: Diabetes Type 2 (Adult)    Problem Identified: Glycemic Management (Diabetes, Type 2)     Goal: Glycemic Management Optimized   Start Date: 06/19/2020  Expected End Date: 09/19/2020  Priority: High  Note:   Objective:  Lab Results  Component Value Date   HGBA1C 6.4 (H) 05/03/2020 .   Lab Results  Component Value Date   CREATININE 7.57 (H) 05/03/2020   CREATININE 8.38 (H) 02/23/2019   CREATININE 4.29 (H) 08/19/2018   Current Barriers:  Marland Kitchen Knowledge Deficits related to basic Diabetes pathophysiology and self care/management  Case Manager Clinical Goal(s):  Marland Kitchen Over the next 90 days, patient will demonstrate improved adherence to prescribed treatment plan for diabetes self care/management as evidenced by:  . daily monitoring and recording of CBG  . adherence to ADA/ carb modified diet . adherence to prescribed medication  regimen  Interventions:  . Provided education to patient about basic DM disease process . Reviewed medications with patient and discussed importance of medication adherence . Discussed plans with patient for ongoing care management follow up and provided patient with direct contact information for care management team . Provided patient with written educational materials related to hypo and hyperglycemia and importance of correct treatment . Advised patient, providing education and rationale, to check cbg 1-2 times daily before meals and record, calling the CCM team and or PCP for findings outside established parameters.   . Educated on daily glycemic control FBS 80-130; after meals <180 . Review mutually-set A1C goal or target range  Patient Goals/Self-Care Activities . Over the next 90 days, patient will:   Self administers insulin as prescribed Attends all scheduled provider appointments Checks blood sugars as prescribed and utilize hyper and hypoglycemia protocol as needed Adheres to prescribed ADA/carb modified  Follow Up Plan: Telephone follow up appointment with care management team member scheduled for:  09/13/20   Patient Care Plan: Hypertension (Adult)    Problem Identified: Hypertension (Hypertension)     Goal: Hypertension Monitored   Start Date: 06/19/2020  Expected End Date: 09/19/2020  Priority: High  Note:   Objective:  . Last practice recorded BP readings:  BP Readings from Last 3 Encounters:  05/03/20 130/64  05/03/20 130/64  02/23/19 112/62   Current Barriers:  Marland Kitchen Knowledge Deficits related to basic understanding of hypertension pathophysiology and self care management  Case Manager Clinical Goal(s):  Marland Kitchen Over the next 90 days, patient will verbalize understanding of plan for hypertension management Interventions:  . Evaluation of current treatment plan related to hypertension self management and patient's adherence to plan as established by  provider. . Reviewed medications with patient and discussed importance of compliance . Discussed plans with patient for ongoing care management follow up and provided patient with direct contact information for care management team . Encourage continued use of home blood pressure monitoring and recording in blood pressure log; include symptoms of hypotension or potential medication side effects in log  Patient Goals/Self-Care Activities . Over the next 90 days, patient will:  -Self administers medications as prescribed Attends all scheduled provider appointments Calls provider office for new concerns, questions, or BP outside discussed parameters Checks BP and records as discussed Follows a low sodium diet/DASH diet  Follow Up Plan: Telephone follow up appointment with care management team member scheduled for: 09/13/20   Problem Identified: Disease Progression (Hypertension)     Goal: Disease Progression Prevented or Minimized   Start Date: 06/19/2020  Expected End Date: 09/19/2020  Priority: High  Note:   Objective:  . Last practice recorded BP readings:  BP Readings from Last 3 Encounters:  05/03/20 130/64  05/03/20 130/64  02/23/19 112/62   Current Barriers:  Marland Kitchen Knowledge Deficits related to basic understanding of hypertension pathophysiology and self care management  Case Manager Clinical Goal(s):  Marland Kitchen Over the next 90 days, patient will verbalize basic understanding of hypertension disease process and self health management plan as evidenced by patient will demonstrate maintaining target BP  Interventions:  . Evaluation of current treatment plan related to hypertension self management and patient's adherence to plan as established by provider. . Reviewed medications with patient and discussed importance of compliance . Discussed plans with patient for ongoing care management follow up and provided patient with direct contact information for care management team . Advised  patient, providing education and rationale, to monitor blood pressure daily and record, calling PCP for findings outside established parameters.   Patient Goals/Self-Care Activities . Over the next 90 days, patient will:  Self administers medications as prescribed Attends all scheduled provider appointments Calls provider office for new concerns, questions, or BP outside discussed parameters Checks BP and records as discussed Follows a low sodium diet/DASH diet  Follow Up Plan: Telephone follow up appointment with care management team member scheduled for: 09/13/20      The patient verbalized understanding of instructions, educational materials, and care plan provided today and declined offer to receive copy of patient instructions, educational materials, and care plan.   Telephone follow up appointment with care management team member scheduled for: 09/13/20  Barb Merino, RN, BSN, CCM Care Management Coordinator [place] [facility]  Direct Phone: [phone]

## 2020-07-05 ENCOUNTER — Telehealth: Payer: Self-pay

## 2020-07-05 ENCOUNTER — Telehealth: Payer: Medicare HMO

## 2020-07-05 NOTE — Telephone Encounter (Signed)
  Chronic Care Management   Outreach Note  07/05/2020 Name: Jennifer Payne MRN: [id number] DOB: [birth year]  Referred by: Glendale Chard, MD Reason for referral : [place] is enrolled in a Managed Medicaid Health Plan: No  SW placed a successful outbound call to the patient to conduct an [facility] screen. The patient reports she is not available at this time.  Follow Up Plan: Telephone follow up appointment with care management team member scheduled for:07/06/20  Daneen Schick, BSW, CDP Social Worker, Certified Dementia Practitioner [place] [facility] [place] [facility] [phone]

## 2020-07-06 ENCOUNTER — Telehealth: Payer: Self-pay

## 2020-07-06 ENCOUNTER — Telehealth: Payer: Medicare HMO

## 2020-07-06 NOTE — Telephone Encounter (Signed)
  Chronic Care Management   Outreach Note  07/06/2020 Name: Jennifer Payne MRN: [id number] DOB: [birth year]  Referred by: Glendale Chard, MD Reason for referral : [place] is enrolled in a Managed Medicaid Health Plan: No  An unsuccessful telephone outreach was attempted today. The patient was referred to the case management team for assistance with care management and care coordination.   Follow Up Plan: A HIPAA compliant phone message was left for the patient providing contact information and requesting a return call.  The care management team will reach out to the patient again over the next 30 days.   Daneen Schick, BSW, CDP Social Worker, Certified Dementia Practitioner [place] [facility] [place] [facility] [phone]

## 2020-07-11 ENCOUNTER — Other Ambulatory Visit: Payer: Self-pay

## 2020-07-11 MED ORDER — ONETOUCH DELICA LANCETS 33G MISC
2 refills | Status: DC
Start: 2020-07-11 — End: 2021-01-10

## 2020-07-11 MED ORDER — ONETOUCH VERIO VI STRP
ORAL_STRIP | 2 refills | Status: DC
Start: 2020-07-11 — End: 2021-01-10

## 2020-07-28 DIAGNOSIS — N186 End stage renal disease: Secondary | ICD-10-CM | POA: Diagnosis not present

## 2020-07-28 DIAGNOSIS — I10 Essential (primary) hypertension: Secondary | ICD-10-CM | POA: Diagnosis not present

## 2020-07-28 DIAGNOSIS — D631 Anemia in chronic kidney disease: Secondary | ICD-10-CM | POA: Diagnosis not present

## 2020-07-30 DIAGNOSIS — D509 Iron deficiency anemia, unspecified: Secondary | ICD-10-CM | POA: Diagnosis not present

## 2020-07-30 DIAGNOSIS — N186 End stage renal disease: Secondary | ICD-10-CM | POA: Diagnosis not present

## 2020-07-30 DIAGNOSIS — N2581 Secondary hyperparathyroidism of renal origin: Secondary | ICD-10-CM | POA: Diagnosis not present

## 2020-07-30 DIAGNOSIS — D631 Anemia in chronic kidney disease: Secondary | ICD-10-CM | POA: Diagnosis not present

## 2020-07-30 DIAGNOSIS — I10 Essential (primary) hypertension: Secondary | ICD-10-CM | POA: Diagnosis not present

## 2020-08-01 DIAGNOSIS — N186 End stage renal disease: Secondary | ICD-10-CM | POA: Diagnosis not present

## 2020-08-01 DIAGNOSIS — D509 Iron deficiency anemia, unspecified: Secondary | ICD-10-CM | POA: Diagnosis not present

## 2020-08-01 DIAGNOSIS — I10 Essential (primary) hypertension: Secondary | ICD-10-CM | POA: Diagnosis not present

## 2020-08-01 DIAGNOSIS — D631 Anemia in chronic kidney disease: Secondary | ICD-10-CM | POA: Diagnosis not present

## 2020-08-01 DIAGNOSIS — N2581 Secondary hyperparathyroidism of renal origin: Secondary | ICD-10-CM | POA: Diagnosis not present

## 2020-08-01 DIAGNOSIS — E119 Type 2 diabetes mellitus without complications: Secondary | ICD-10-CM | POA: Diagnosis not present

## 2020-08-01 DIAGNOSIS — E039 Hypothyroidism, unspecified: Secondary | ICD-10-CM | POA: Diagnosis not present

## 2020-08-03 DIAGNOSIS — N2581 Secondary hyperparathyroidism of renal origin: Secondary | ICD-10-CM | POA: Diagnosis not present

## 2020-08-03 DIAGNOSIS — D509 Iron deficiency anemia, unspecified: Secondary | ICD-10-CM | POA: Diagnosis not present

## 2020-08-03 DIAGNOSIS — N186 End stage renal disease: Secondary | ICD-10-CM | POA: Diagnosis not present

## 2020-08-03 DIAGNOSIS — I10 Essential (primary) hypertension: Secondary | ICD-10-CM | POA: Diagnosis not present

## 2020-08-03 DIAGNOSIS — D631 Anemia in chronic kidney disease: Secondary | ICD-10-CM | POA: Diagnosis not present

## 2020-08-06 DIAGNOSIS — D509 Iron deficiency anemia, unspecified: Secondary | ICD-10-CM | POA: Diagnosis not present

## 2020-08-06 DIAGNOSIS — D631 Anemia in chronic kidney disease: Secondary | ICD-10-CM | POA: Diagnosis not present

## 2020-08-06 DIAGNOSIS — N2581 Secondary hyperparathyroidism of renal origin: Secondary | ICD-10-CM | POA: Diagnosis not present

## 2020-08-06 DIAGNOSIS — N186 End stage renal disease: Secondary | ICD-10-CM | POA: Diagnosis not present

## 2020-08-06 DIAGNOSIS — I10 Essential (primary) hypertension: Secondary | ICD-10-CM | POA: Diagnosis not present

## 2020-08-08 DIAGNOSIS — N186 End stage renal disease: Secondary | ICD-10-CM | POA: Diagnosis not present

## 2020-08-08 DIAGNOSIS — D631 Anemia in chronic kidney disease: Secondary | ICD-10-CM | POA: Diagnosis not present

## 2020-08-08 DIAGNOSIS — N2581 Secondary hyperparathyroidism of renal origin: Secondary | ICD-10-CM | POA: Diagnosis not present

## 2020-08-08 DIAGNOSIS — I10 Essential (primary) hypertension: Secondary | ICD-10-CM | POA: Diagnosis not present

## 2020-08-08 DIAGNOSIS — D509 Iron deficiency anemia, unspecified: Secondary | ICD-10-CM | POA: Diagnosis not present

## 2020-08-09 ENCOUNTER — Ambulatory Visit: Payer: Medicare HMO

## 2020-08-09 DIAGNOSIS — Z992 Dependence on renal dialysis: Secondary | ICD-10-CM

## 2020-08-09 DIAGNOSIS — G8929 Other chronic pain: Secondary | ICD-10-CM | POA: Diagnosis not present

## 2020-08-09 DIAGNOSIS — E1122 Type 2 diabetes mellitus with diabetic chronic kidney disease: Secondary | ICD-10-CM

## 2020-08-09 DIAGNOSIS — E785 Hyperlipidemia, unspecified: Secondary | ICD-10-CM | POA: Diagnosis not present

## 2020-08-09 DIAGNOSIS — E669 Obesity, unspecified: Secondary | ICD-10-CM | POA: Diagnosis not present

## 2020-08-09 DIAGNOSIS — N186 End stage renal disease: Secondary | ICD-10-CM

## 2020-08-09 DIAGNOSIS — H547 Unspecified visual loss: Secondary | ICD-10-CM | POA: Diagnosis not present

## 2020-08-09 DIAGNOSIS — Z008 Encounter for other general examination: Secondary | ICD-10-CM | POA: Diagnosis not present

## 2020-08-09 DIAGNOSIS — E1151 Type 2 diabetes mellitus with diabetic peripheral angiopathy without gangrene: Secondary | ICD-10-CM | POA: Diagnosis not present

## 2020-08-09 DIAGNOSIS — I120 Hypertensive chronic kidney disease with stage 5 chronic kidney disease or end stage renal disease: Secondary | ICD-10-CM | POA: Diagnosis not present

## 2020-08-09 DIAGNOSIS — I129 Hypertensive chronic kidney disease with stage 1 through stage 4 chronic kidney disease, or unspecified chronic kidney disease: Secondary | ICD-10-CM

## 2020-08-09 DIAGNOSIS — E039 Hypothyroidism, unspecified: Secondary | ICD-10-CM | POA: Diagnosis not present

## 2020-08-09 NOTE — Patient Instructions (Signed)
   Goals we discussed today:  Goals Addressed            This Visit's Progress   . Work with SW to manage care coordination needs related to ESRD       Timeframe:  Long-Range Goal Priority:  Low Start Date:   1.13.22                          Expected End Date: 5.13.22                      Next planned outreach date: 4.7.22  Patient Goals/Self-Care Activities Over the next 90 days, patient will:   - Patient will self administer medications as prescribed Patient will attend all scheduled provider appointments Patient will call provider office for new concerns or questions Contact SW as needed prior to next scheduled call

## 2020-08-09 NOTE — Chronic Care Management (AMB) (Signed)
Chronic Care Management    Social Work Note  08/09/2020 Name: Jennifer Payne MRN: [id number] DOB: 09-01-1950  Jennifer Payne is a 70 y.o. year old female who is a primary care patient of Glendale Chard, MD. The CCM team was consulted to assist the patient with chronic disease management and/or care coordination needs related to: Transportation Needs  and [employer] .   Engaged with patient by telephone for initial visit in response to provider referral for social work chronic care management and care coordination services.   Consent to Services:  The patient was given information about Chronic Care Management services, agreed to services, and gave verbal consent prior to initiation of services.  Please see initial visit note for detailed documentation.   Patient agreed to services and consent obtained.   Assessment: Review of patient past medical history, allergies, medications, and health status, including review of relevant consultants reports was performed today as part of a comprehensive evaluation and provision of chronic care management and care coordination services.     SDOH (Social Determinants of Health) assessments and interventions performed:  SDOH Interventions   Flowsheet Row Most Recent Value  SDOH Interventions   Food Insecurity Interventions Intervention Not Indicated  Housing Interventions Intervention Not Indicated  Transportation Interventions Intervention Not Indicated       Advanced Directives Status: Not addressed in this encounter.  CCM Care Plan  Allergies  Allergen Reactions  . Erythromycin Rash    Outpatient Encounter Medications as of 08/09/2020  Medication Sig  . amLODipine (NORVASC) 5 MG tablet  (Patient not taking: Reported on 05/03/2020)  . atorvastatin (LIPITOR) 80 MG tablet Take 1 tablet by mouth once daily  . BD PEN NEEDLE NANO U/F 32G X 4 MM MISC USE AS DIRECTED WITH LEVEMIR PEN  . calcitRIOL (ROCALTROL) 0.5 MCG capsule Take  0.5 mcg by mouth daily.  . clobetasol ointment (TEMOVATE) 8.41 % Apply 1 application topically 2 (two) times daily. (Patient not taking: Reported on 05/03/2020)  . Continuous Blood Gluc Receiver (FREESTYLE LIBRE 14 DAY READER) DEVI Inject 1 each into the skin 4 (four) times daily. To check blood sugars dx: e11.22  . Continuous Blood Gluc Sensor (FREESTYLE LIBRE 14 DAY SENSOR) MISC Inject 1 each into the skin QID. Dx:e11.22  . glucose blood (ONETOUCH VERIO) test strip Use as instructed to check blood sugars  3 times per day dx: e11.65  . glucose blood test strip OneTouch Verio test strips  USE 1 TEST STRIP TO CHECK BLOOD SUGAR THREE TIMES DAILY AS INSTRUCTED  . hydrALAZINE (APRESOLINE) 25 MG tablet  (Patient not taking: Reported on 05/03/2020)  . LEVEMIR FLEXTOUCH 100 UNIT/ML Pen INJECT 20 UNITS SUBCUTANEOUSLY ONCE DAILY AT BEDTIME (Patient not taking: Reported on 05/03/2020)  . levothyroxine (EUTHYROX) 88 MCG tablet Take 1 tablet (88 mcg total) by mouth daily.  . metolazone (ZAROXOLYN) 2.5 MG tablet Monday, Wednesday, Friday (Patient not taking: Reported on 05/03/2020)  . metoprolol succinate (TOPROL-XL) 100 MG 24 hr tablet TAKE 1 TABLET BY MOUTH ONCE DAILY IN THE MORNING (Patient not taking: Reported on 05/03/2020)  . OneTouch Delica Lancets 66A MISC Use as instructed to check blood sugars  3 times per day dx: e11.65  . torsemide (DEMADEX) 20 MG tablet  (Patient not taking: Reported on 05/03/2020)   No facility-administered encounter medications on file as of 08/09/2020.    Patient Active Problem List   Diagnosis Date Noted  . Pain in toes of both feet 05/20/2020  . Class 1  obesity due to excess calories with serious comorbidity and body mass index (BMI) of 34.0 to 34.9 in adult 05/20/2020  . Type 2 diabetes mellitus with stage 4 chronic kidney disease, with long-term current use of insulin ([place]) 08/19/2018  . Hypertensive nephropathy 08/19/2018  . Primary hypothyroidism 08/19/2018  .  Proliferative diabetic retinopathy associated with type 2 diabetes mellitus ([place]) 08/19/2018  . Encounter for general adult medical examination without abnormal findings 01/14/2018    Conditions to be addressed/monitored: DMII and ESRD; Transportation  Care Plan : Social Work [place]  Updates made by Daneen Schick since 08/09/2020 12:00 AM    Problem: Disease Progression - ESRD     Long-Range Goal: Care Coordination needs related to ESRD   Start Date: 08/09/2020  Expected End Date: 12/07/2020  This Visit's Progress: On track  Priority: Low  Note:   Current Barriers:  . Limited social support . Transportation . Chronic conditions including DM II, ESRD, and Hypertensive Nephropathy putting patient at increased risk of hospitalization  Social Work Clinical Goal(s):  Marland Kitchen Over the next 120 days, patient will work with SW to address concerns related to care coordination  Interventions: . 1:1 collaboration with Glendale Chard, MD regarding development and update of comprehensive plan of care as evidenced by provider attestation and co-signature . Inter-disciplinary care team collaboration (see longitudinal plan of care) . Successful outbound call placed to the patient to conduct an [facility] screen - no acute challenged noted at this time . Discussed the patient currently drives herself to HD treatments and may need transportation resources in the future . Provided education on available transportation resources to assist with future needs . Scheduled follow up call over the next 90 days  Patient Goals/Self-Care Activities Over the next 90 days, patient will:   - Patient will self administer medications as prescribed Patient will attend all scheduled provider appointments Patient will call provider office for new concerns or questions Contact SW as needed prior to next scheduled call  Follow up Plan: SW will follow up with patient by phone over the next 90 days       Follow Up  Plan: SW will follow up with patient by phone over the next 90 days      Daneen Schick, BSW, CDP Social Worker, Certified Dementia Practitioner [place] [facility] [place] [facility] [phone]  Total time spent performing care coordination and/or care management activities with the patient by phone or face to face = 15 minutes.

## 2020-08-10 DIAGNOSIS — N2581 Secondary hyperparathyroidism of renal origin: Secondary | ICD-10-CM | POA: Diagnosis not present

## 2020-08-10 DIAGNOSIS — N186 End stage renal disease: Secondary | ICD-10-CM | POA: Diagnosis not present

## 2020-08-10 DIAGNOSIS — I10 Essential (primary) hypertension: Secondary | ICD-10-CM | POA: Diagnosis not present

## 2020-08-10 DIAGNOSIS — D631 Anemia in chronic kidney disease: Secondary | ICD-10-CM | POA: Diagnosis not present

## 2020-08-10 DIAGNOSIS — D509 Iron deficiency anemia, unspecified: Secondary | ICD-10-CM | POA: Diagnosis not present

## 2020-08-16 DIAGNOSIS — I10 Essential (primary) hypertension: Secondary | ICD-10-CM | POA: Diagnosis not present

## 2020-08-16 DIAGNOSIS — N186 End stage renal disease: Secondary | ICD-10-CM | POA: Diagnosis not present

## 2020-08-16 DIAGNOSIS — D631 Anemia in chronic kidney disease: Secondary | ICD-10-CM | POA: Diagnosis not present

## 2020-08-16 DIAGNOSIS — N2581 Secondary hyperparathyroidism of renal origin: Secondary | ICD-10-CM | POA: Diagnosis not present

## 2020-08-16 DIAGNOSIS — D509 Iron deficiency anemia, unspecified: Secondary | ICD-10-CM | POA: Diagnosis not present

## 2020-08-17 DIAGNOSIS — D509 Iron deficiency anemia, unspecified: Secondary | ICD-10-CM | POA: Diagnosis not present

## 2020-08-17 DIAGNOSIS — D631 Anemia in chronic kidney disease: Secondary | ICD-10-CM | POA: Diagnosis not present

## 2020-08-17 DIAGNOSIS — N2581 Secondary hyperparathyroidism of renal origin: Secondary | ICD-10-CM | POA: Diagnosis not present

## 2020-08-17 DIAGNOSIS — I10 Essential (primary) hypertension: Secondary | ICD-10-CM | POA: Diagnosis not present

## 2020-08-17 DIAGNOSIS — N186 End stage renal disease: Secondary | ICD-10-CM | POA: Diagnosis not present

## 2020-08-20 DIAGNOSIS — D509 Iron deficiency anemia, unspecified: Secondary | ICD-10-CM | POA: Diagnosis not present

## 2020-08-20 DIAGNOSIS — I10 Essential (primary) hypertension: Secondary | ICD-10-CM | POA: Diagnosis not present

## 2020-08-20 DIAGNOSIS — N2581 Secondary hyperparathyroidism of renal origin: Secondary | ICD-10-CM | POA: Diagnosis not present

## 2020-08-20 DIAGNOSIS — D631 Anemia in chronic kidney disease: Secondary | ICD-10-CM | POA: Diagnosis not present

## 2020-08-20 DIAGNOSIS — N186 End stage renal disease: Secondary | ICD-10-CM | POA: Diagnosis not present

## 2020-08-22 DIAGNOSIS — D631 Anemia in chronic kidney disease: Secondary | ICD-10-CM | POA: Diagnosis not present

## 2020-08-22 DIAGNOSIS — N186 End stage renal disease: Secondary | ICD-10-CM | POA: Diagnosis not present

## 2020-08-22 DIAGNOSIS — N2581 Secondary hyperparathyroidism of renal origin: Secondary | ICD-10-CM | POA: Diagnosis not present

## 2020-08-22 DIAGNOSIS — I10 Essential (primary) hypertension: Secondary | ICD-10-CM | POA: Diagnosis not present

## 2020-08-22 DIAGNOSIS — D509 Iron deficiency anemia, unspecified: Secondary | ICD-10-CM | POA: Diagnosis not present

## 2020-08-24 DIAGNOSIS — D509 Iron deficiency anemia, unspecified: Secondary | ICD-10-CM | POA: Diagnosis not present

## 2020-08-24 DIAGNOSIS — D631 Anemia in chronic kidney disease: Secondary | ICD-10-CM | POA: Diagnosis not present

## 2020-08-24 DIAGNOSIS — N2581 Secondary hyperparathyroidism of renal origin: Secondary | ICD-10-CM | POA: Diagnosis not present

## 2020-08-24 DIAGNOSIS — I10 Essential (primary) hypertension: Secondary | ICD-10-CM | POA: Diagnosis not present

## 2020-08-24 DIAGNOSIS — N186 End stage renal disease: Secondary | ICD-10-CM | POA: Diagnosis not present

## 2020-08-27 DIAGNOSIS — N2581 Secondary hyperparathyroidism of renal origin: Secondary | ICD-10-CM | POA: Diagnosis not present

## 2020-08-27 DIAGNOSIS — D631 Anemia in chronic kidney disease: Secondary | ICD-10-CM | POA: Diagnosis not present

## 2020-08-27 DIAGNOSIS — I10 Essential (primary) hypertension: Secondary | ICD-10-CM | POA: Diagnosis not present

## 2020-08-27 DIAGNOSIS — N186 End stage renal disease: Secondary | ICD-10-CM | POA: Diagnosis not present

## 2020-08-27 DIAGNOSIS — D509 Iron deficiency anemia, unspecified: Secondary | ICD-10-CM | POA: Diagnosis not present

## 2020-08-28 DIAGNOSIS — N186 End stage renal disease: Secondary | ICD-10-CM | POA: Diagnosis not present

## 2020-08-28 DIAGNOSIS — I10 Essential (primary) hypertension: Secondary | ICD-10-CM | POA: Diagnosis not present

## 2020-08-28 DIAGNOSIS — D631 Anemia in chronic kidney disease: Secondary | ICD-10-CM | POA: Diagnosis not present

## 2020-08-29 DIAGNOSIS — N186 End stage renal disease: Secondary | ICD-10-CM | POA: Diagnosis not present

## 2020-08-29 DIAGNOSIS — D509 Iron deficiency anemia, unspecified: Secondary | ICD-10-CM | POA: Diagnosis not present

## 2020-08-29 DIAGNOSIS — D631 Anemia in chronic kidney disease: Secondary | ICD-10-CM | POA: Diagnosis not present

## 2020-08-29 DIAGNOSIS — N2581 Secondary hyperparathyroidism of renal origin: Secondary | ICD-10-CM | POA: Diagnosis not present

## 2020-08-31 DIAGNOSIS — N2581 Secondary hyperparathyroidism of renal origin: Secondary | ICD-10-CM | POA: Diagnosis not present

## 2020-08-31 DIAGNOSIS — D631 Anemia in chronic kidney disease: Secondary | ICD-10-CM | POA: Diagnosis not present

## 2020-08-31 DIAGNOSIS — D509 Iron deficiency anemia, unspecified: Secondary | ICD-10-CM | POA: Diagnosis not present

## 2020-08-31 DIAGNOSIS — N186 End stage renal disease: Secondary | ICD-10-CM | POA: Diagnosis not present

## 2020-09-03 DIAGNOSIS — D631 Anemia in chronic kidney disease: Secondary | ICD-10-CM | POA: Diagnosis not present

## 2020-09-03 DIAGNOSIS — N186 End stage renal disease: Secondary | ICD-10-CM | POA: Diagnosis not present

## 2020-09-03 DIAGNOSIS — N2581 Secondary hyperparathyroidism of renal origin: Secondary | ICD-10-CM | POA: Diagnosis not present

## 2020-09-03 DIAGNOSIS — D509 Iron deficiency anemia, unspecified: Secondary | ICD-10-CM | POA: Diagnosis not present

## 2020-09-05 DIAGNOSIS — D631 Anemia in chronic kidney disease: Secondary | ICD-10-CM | POA: Diagnosis not present

## 2020-09-05 DIAGNOSIS — N186 End stage renal disease: Secondary | ICD-10-CM | POA: Diagnosis not present

## 2020-09-05 DIAGNOSIS — N2581 Secondary hyperparathyroidism of renal origin: Secondary | ICD-10-CM | POA: Diagnosis not present

## 2020-09-05 DIAGNOSIS — D509 Iron deficiency anemia, unspecified: Secondary | ICD-10-CM | POA: Diagnosis not present

## 2020-09-07 DIAGNOSIS — N2581 Secondary hyperparathyroidism of renal origin: Secondary | ICD-10-CM | POA: Diagnosis not present

## 2020-09-07 DIAGNOSIS — D509 Iron deficiency anemia, unspecified: Secondary | ICD-10-CM | POA: Diagnosis not present

## 2020-09-07 DIAGNOSIS — N186 End stage renal disease: Secondary | ICD-10-CM | POA: Diagnosis not present

## 2020-09-07 DIAGNOSIS — D631 Anemia in chronic kidney disease: Secondary | ICD-10-CM | POA: Diagnosis not present

## 2020-09-10 DIAGNOSIS — N2581 Secondary hyperparathyroidism of renal origin: Secondary | ICD-10-CM | POA: Diagnosis not present

## 2020-09-10 DIAGNOSIS — D631 Anemia in chronic kidney disease: Secondary | ICD-10-CM | POA: Diagnosis not present

## 2020-09-10 DIAGNOSIS — D509 Iron deficiency anemia, unspecified: Secondary | ICD-10-CM | POA: Diagnosis not present

## 2020-09-10 DIAGNOSIS — N186 End stage renal disease: Secondary | ICD-10-CM | POA: Diagnosis not present

## 2020-09-11 ENCOUNTER — Ambulatory Visit: Payer: Medicare HMO | Admitting: Internal Medicine

## 2020-09-12 DIAGNOSIS — N2581 Secondary hyperparathyroidism of renal origin: Secondary | ICD-10-CM | POA: Diagnosis not present

## 2020-09-12 DIAGNOSIS — N186 End stage renal disease: Secondary | ICD-10-CM | POA: Diagnosis not present

## 2020-09-12 DIAGNOSIS — D631 Anemia in chronic kidney disease: Secondary | ICD-10-CM | POA: Diagnosis not present

## 2020-09-12 DIAGNOSIS — D509 Iron deficiency anemia, unspecified: Secondary | ICD-10-CM | POA: Diagnosis not present

## 2020-09-13 ENCOUNTER — Telehealth: Payer: Medicare HMO

## 2020-09-13 ENCOUNTER — Ambulatory Visit (INDEPENDENT_AMBULATORY_CARE_PROVIDER_SITE_OTHER): Payer: Medicare HMO

## 2020-09-13 DIAGNOSIS — N186 End stage renal disease: Secondary | ICD-10-CM

## 2020-09-13 DIAGNOSIS — I120 Hypertensive chronic kidney disease with stage 5 chronic kidney disease or end stage renal disease: Secondary | ICD-10-CM | POA: Diagnosis not present

## 2020-09-13 DIAGNOSIS — I129 Hypertensive chronic kidney disease with stage 1 through stage 4 chronic kidney disease, or unspecified chronic kidney disease: Secondary | ICD-10-CM

## 2020-09-13 DIAGNOSIS — E1122 Type 2 diabetes mellitus with diabetic chronic kidney disease: Secondary | ICD-10-CM | POA: Diagnosis not present

## 2020-09-13 DIAGNOSIS — Z992 Dependence on renal dialysis: Secondary | ICD-10-CM | POA: Diagnosis not present

## 2020-09-14 ENCOUNTER — Other Ambulatory Visit: Payer: Self-pay | Admitting: Internal Medicine

## 2020-09-14 DIAGNOSIS — D509 Iron deficiency anemia, unspecified: Secondary | ICD-10-CM | POA: Diagnosis not present

## 2020-09-14 DIAGNOSIS — D631 Anemia in chronic kidney disease: Secondary | ICD-10-CM | POA: Diagnosis not present

## 2020-09-14 DIAGNOSIS — N2581 Secondary hyperparathyroidism of renal origin: Secondary | ICD-10-CM | POA: Diagnosis not present

## 2020-09-14 DIAGNOSIS — N186 End stage renal disease: Secondary | ICD-10-CM | POA: Diagnosis not present

## 2020-09-17 DIAGNOSIS — D631 Anemia in chronic kidney disease: Secondary | ICD-10-CM | POA: Diagnosis not present

## 2020-09-17 DIAGNOSIS — N186 End stage renal disease: Secondary | ICD-10-CM | POA: Diagnosis not present

## 2020-09-17 DIAGNOSIS — D509 Iron deficiency anemia, unspecified: Secondary | ICD-10-CM | POA: Diagnosis not present

## 2020-09-17 DIAGNOSIS — N2581 Secondary hyperparathyroidism of renal origin: Secondary | ICD-10-CM | POA: Diagnosis not present

## 2020-09-17 NOTE — Patient Instructions (Signed)
Goals Addressed      Patient Stated   .  Monitor and Manage My Blood Sugar (pt-stated)   On track     Timeframe:  Long-Range Goal Priority:  Medium Start Date: 06/28/20                            Expected End Date:  12/27/20                      Follow Up Date 11/29/20 Self administers insulin as prescribed Attend all scheduled provider appointments Check blood sugars as prescribed and utilize hyper and hypoglycemia protocol as needed Adhere to prescribed ADA/carb modified   Why is this important?    Checking your blood sugar at home helps to keep it from getting very high or very low.   Writing the results in a diary or log helps the doctor know how to care for you.   Your blood sugar log should have the time, date and the results.   Also, write down the amount of insulin or other medicine that you take.   Other information, like what you ate, exercise done and how you were feeling, will also be helpful.          Other   .  Obtain Eye Exam   Not on track     Timeframe:  Long-Range Goal Priority:  High Start Date: 06/28/20                            Expected End Date: 12/27/20                      Follow Up Date 05/05//22   - keep appointment with eye doctor - schedule appointment with eye doctor    Why is this important?    Eye check-ups are important when you have diabetes.   Vision loss can be prevented.       Marland Kitchen  Perform Foot Care   On track     Timeframe:  Long-Range Goal Priority:  Medium Start Date: 06/28/20                            Expected End Date: 12/27/20                       Follow Up Date: 12/27/20   - check feet daily for cuts, sores or redness - trim toenails straight across - wash and dry feet carefully every day - wear comfortable, cotton socks - wear comfortable, well-fitting shoes    Why is this important?    Good foot care is very important when you have diabetes.   There are many things you can do to keep your feet healthy and catch a  problem early.       .  COMPLETED: Set My Target A1C        Follow Up Date 09/19/20   - set target A1C    Why is this important?    Your target A1C is decided together by you and your doctor.   It is based on several things like your age and other health issues.        .  Track and Manage My Blood Pressure   On track     Timeframe:  Long-Range Goal  Priority:  Medium Start Date: 06/28/20                            Expected End Date: 12/27/20                      Follow Up Date: 11/29/20   - check blood pressure 3 times per week (to be completed during HD treatments)    Why is this important?    You won't feel high blood pressure, but it can still hurt your blood vessels.   High blood pressure can cause heart or kidney problems. It can also cause a stroke.   Making lifestyle changes like losing a Jennifer Payne weight or eating less salt will help.   Checking your blood pressure at home and at different times of the day can help to control blood pressure.   If the doctor prescribes medicine remember to take it the way the doctor ordered.   Call the office if you cannot afford the medicine or if there are questions about it.

## 2020-09-17 NOTE — Chronic Care Management (AMB) (Signed)
Chronic Care Management   CCM RN Visit Note  09/13/2020 Name: Saniyyah Elster MRN: [id number] DOB: [birth year]  Subjective: Thaila Bottoms is a 70 y.o. year old female who is a primary care patient of Glendale Chard, MD. The care management team was consulted for assistance with disease management and care coordination needs.    Engaged with patient by telephone for follow up visit in response to provider referral for case management and/or care coordination services.   Consent to Services:  The patient was given information about Chronic Care Management services, agreed to services, and gave verbal consent prior to initiation of services.  Please see initial visit note for detailed documentation.   Patient agreed to services and verbal consent obtained.   Assessment: Review of patient past medical history, allergies, medications, health status, including review of consultants reports, laboratory and other test data, was performed as part of comprehensive evaluation and provision of chronic care management services.   SDOH (Social Determinants of Health) assessments and interventions performed:  Yes, no acute challenges   CCM Care Plan  Allergies  Allergen Reactions  . Erythromycin Rash    Outpatient Encounter Medications as of 09/13/2020  Medication Sig  . amLODipine (NORVASC) 5 MG tablet  (Patient not taking: Reported on 05/03/2020)  . atorvastatin (LIPITOR) 80 MG tablet Take 1 tablet by mouth once daily  . BD PEN NEEDLE NANO U/F 32G X 4 MM MISC USE AS DIRECTED WITH LEVEMIR PEN  . calcitRIOL (ROCALTROL) 0.5 MCG capsule Take 0.5 mcg by mouth daily.  . clobetasol ointment (TEMOVATE) 9.76 % Apply 1 application topically 2 (two) times daily. (Patient not taking: Reported on 05/03/2020)  . Continuous Blood Gluc Receiver (FREESTYLE LIBRE 14 DAY READER) DEVI Inject 1 each into the skin 4 (four) times daily. To check blood sugars dx: e11.22  . Continuous Blood Gluc Sensor (FREESTYLE  LIBRE 14 DAY SENSOR) MISC Inject 1 each into the skin QID. Dx:e11.22  . glucose blood (ONETOUCH VERIO) test strip Use as instructed to check blood sugars  3 times per day dx: e11.65  . glucose blood test strip OneTouch Verio test strips  USE 1 TEST STRIP TO CHECK BLOOD SUGAR THREE TIMES DAILY AS INSTRUCTED  . hydrALAZINE (APRESOLINE) 25 MG tablet  (Patient not taking: Reported on 05/03/2020)  . LEVEMIR FLEXTOUCH 100 UNIT/ML Pen INJECT 20 UNITS SUBCUTANEOUSLY ONCE DAILY AT BEDTIME (Patient not taking: Reported on 05/03/2020)  . metolazone (ZAROXOLYN) 2.5 MG tablet Monday, Wednesday, Friday (Patient not taking: Reported on 05/03/2020)  . metoprolol succinate (TOPROL-XL) 100 MG 24 hr tablet TAKE 1 TABLET BY MOUTH ONCE DAILY IN THE MORNING (Patient not taking: Reported on 05/03/2020)  . OneTouch Delica Lancets 73A MISC Use as instructed to check blood sugars  3 times per day dx: e11.65  . torsemide (DEMADEX) 20 MG tablet  (Patient not taking: Reported on 05/03/2020)  . [DISCONTINUED] levothyroxine (EUTHYROX) 88 MCG tablet Take 1 tablet (88 mcg total) by mouth daily.   No facility-administered encounter medications on file as of 09/13/2020.    Patient Active Problem List   Diagnosis Date Noted  . Pain in toes of both feet 05/20/2020  . Class 1 obesity due to excess calories with serious comorbidity and body mass index (BMI) of 34.0 to 34.9 in adult 05/20/2020  . Type 2 diabetes mellitus with stage 4 chronic kidney disease, with long-term current use of insulin ([place]) 08/19/2018  . Hypertensive nephropathy 08/19/2018  . Primary hypothyroidism 08/19/2018  . 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 Encounter for general adult medical examination without abnormal findings 01/14/2018    Conditions to be addressed/monitored:Type II DM, hypertensive nephropathy, ESRD on HD  Care Plan : Diabetes Type 2 (Adult)  Updates made by Lynne Logan, RN since  09/17/2020 12:00 AM    Problem: Glycemic Management (Diabetes, Type 2)   Priority: Medium    Long-Range Goal: Glycemic Management Optimized   Start Date: 06/19/2020  Expected End Date: 12/27/2020  This Visit's Progress: On track  Priority: Medium  Note:   Objective:  Lab Results  Component Value Date   HGBA1C 6.4 (H) 05/03/2020 .   Lab Results  Component Value Date   CREATININE 7.57 (H) 05/03/2020   CREATININE 8.38 (H) 02/23/2019   CREATININE 4.29 (H) 08/19/2018   Current Barriers:  Marland Kitchen Knowledge Deficits related to basic Diabetes pathophysiology and self care/management . Knowledge Deficits related to medications used for management of diabetes Case Manager Clinical Goal(s):  Marland Kitchen Over the next 180 days, patient will demonstrate improved adherence to prescribed treatment plan for diabetes self care/management as evidenced by:  . daily monitoring and recording of CBG  . adherence to ADA/ carb modified diet . adherence to prescribed medication regimen Interventions:  09/13/20 call completed with patient  . Provided education to patient about basic DM disease process . Educated on daily glycemic control FBS 80-130, <180 after meals; Educated on dietary and exercise recommendations  . Reviewed medications with patient and discussed importance of medication adherence . Confirmed patient received and reviewed written educational materials related to hypo and hyperglycemia and importance of correct treatment . Advised patient, providing education and rationale, to check cbg 1-2 times daily before meals and record, calling the CCM team and or PCP for findings outside established parameters.   . Discussed plans with patient for ongoing care management follow up and provided patient with direct contact information for care management team Patient Goals/Self-Care Activities . Over the next 180 days, patient will:  Self administers insulin as prescribed Attend all scheduled provider  appointments Check blood sugars as prescribed and utilize hyper and hypoglycemia protocol as needed Adhere to prescribed ADA/carb modified Follow Up Plan: Telephone follow up appointment with care management team member scheduled for:  11/29/20   Care Plan : Hypertension (Adult)  Updates made by Lynne Logan, RN since 09/17/2020 12:00 AM    Problem: Hypertension (Hypertension)   Priority: High    Long-Range Goal: Hypertension Monitored   Start Date: 06/28/2020  Expected End Date: 12/27/2020  Priority: High  Note:   Objective:  . Last practice recorded BP readings:  BP Readings from Last 3 Encounters:  05/03/20 130/64  05/03/20 130/64  02/23/19 112/62   Current Barriers:  Marland Kitchen Knowledge Deficits related to basic understanding of hypertension pathophysiology and self care management . Knowledge Deficits related to understanding of medications prescribed for management of hypertension Case Manager Clinical Goal(s):  Marland Kitchen Over the next 180 days, patient will verbalize understanding of plan for hypertension management Interventions:  09/13/20 call completed with patient  . Evaluation of current treatment plan related to hypertension self management and patient's adherence to plan as established by provider. . Reviewed medications with patient and discussed importance of compliance . Encourage continued use of home blood pressure monitoring and recording in blood pressure log; include symptoms of hypotension or potential medication side effects in log . Discussed plans with patient for ongoing care management follow up and provided patient  with direct contact information for care management team Patient Goals/Self-Care Activities . Over the next 180 days, patient will:  Self administers medications as prescribed Attend all scheduled provider appointments Call provider office for new concerns, questions, or BP outside discussed parameters Check BP and records as discussed Follow a low sodium  diet/DASH diet Follow Up Plan: Telephone follow up appointment with care management team member scheduled for: 11/29/20  Problem: Disease Progression (Hypertension)   Priority: High    Long-Range Goal: Disease Progression Prevented or Minimized   Start Date: 06/28/2020  Expected End Date: 12/27/2020  This Visit's Progress: On track  Priority: High  Note:   Objective:  . Last practice recorded BP readings:  BP Readings from Last 3 Encounters:  05/03/20 130/64  05/03/20 130/64  02/23/19 112/62   Current Barriers:  Marland Kitchen Knowledge Deficits related to basic understanding of hypertension pathophysiology and self care management . Knowledge Deficits related to understanding of medications prescribed for management of hypertension Case Manager Clinical Goal(s):  Marland Kitchen Over the next 180 days, patient will verbalize basic understanding of hypertension disease process and self health management plan as evidenced by patient will demonstrate maintaining target BP Interventions:  09/13/20 call completed with patient  . Evaluation of current treatment plan related to hypertension self management and patient's adherence to plan as established by provider. . Reviewed medications with patient and discussed importance of compliance . Advised patient, providing education and rationale, to monitor blood pressure daily and record, calling PCP for findings outside established parameters.  . Discussed plans with patient for ongoing care management follow up and provided patient with direct contact information for care management team Patient Goals/Self-Care Activities . Over the next 180 days, patient will:  Self administers medications as prescribed Attend all scheduled provider appointments Call provider office for new concerns, questions, or BP outside discussed parameters Check BP and records as discussed Follow a low sodium diet/DASH diet Follow Up Plan: Telephone follow up appointment with care management team  member scheduled for: 11/29/20     Plan:Telephone follow up appointment with care management team member scheduled for:  11/29/20  Barb Merino, RN, BSN, CCM Care Management Coordinator [facility]  Direct Phone: [phone]

## 2020-09-19 DIAGNOSIS — N2581 Secondary hyperparathyroidism of renal origin: Secondary | ICD-10-CM | POA: Diagnosis not present

## 2020-09-19 DIAGNOSIS — N186 End stage renal disease: Secondary | ICD-10-CM | POA: Diagnosis not present

## 2020-09-19 DIAGNOSIS — D509 Iron deficiency anemia, unspecified: Secondary | ICD-10-CM | POA: Diagnosis not present

## 2020-09-19 DIAGNOSIS — D631 Anemia in chronic kidney disease: Secondary | ICD-10-CM | POA: Diagnosis not present

## 2020-09-21 DIAGNOSIS — N186 End stage renal disease: Secondary | ICD-10-CM | POA: Diagnosis not present

## 2020-09-21 DIAGNOSIS — D631 Anemia in chronic kidney disease: Secondary | ICD-10-CM | POA: Diagnosis not present

## 2020-09-21 DIAGNOSIS — N2581 Secondary hyperparathyroidism of renal origin: Secondary | ICD-10-CM | POA: Diagnosis not present

## 2020-09-21 DIAGNOSIS — D509 Iron deficiency anemia, unspecified: Secondary | ICD-10-CM | POA: Diagnosis not present

## 2020-09-22 ENCOUNTER — Other Ambulatory Visit: Payer: Self-pay | Admitting: Internal Medicine

## 2020-09-24 DIAGNOSIS — N186 End stage renal disease: Secondary | ICD-10-CM | POA: Diagnosis not present

## 2020-09-24 DIAGNOSIS — N2581 Secondary hyperparathyroidism of renal origin: Secondary | ICD-10-CM | POA: Diagnosis not present

## 2020-09-24 DIAGNOSIS — D631 Anemia in chronic kidney disease: Secondary | ICD-10-CM | POA: Diagnosis not present

## 2020-09-24 DIAGNOSIS — D509 Iron deficiency anemia, unspecified: Secondary | ICD-10-CM | POA: Diagnosis not present

## 2020-09-25 DIAGNOSIS — I10 Essential (primary) hypertension: Secondary | ICD-10-CM | POA: Diagnosis not present

## 2020-09-25 DIAGNOSIS — N186 End stage renal disease: Secondary | ICD-10-CM | POA: Diagnosis not present

## 2020-09-25 DIAGNOSIS — D631 Anemia in chronic kidney disease: Secondary | ICD-10-CM | POA: Diagnosis not present

## 2020-09-26 DIAGNOSIS — D631 Anemia in chronic kidney disease: Secondary | ICD-10-CM | POA: Diagnosis not present

## 2020-09-26 DIAGNOSIS — N2581 Secondary hyperparathyroidism of renal origin: Secondary | ICD-10-CM | POA: Diagnosis not present

## 2020-09-26 DIAGNOSIS — D509 Iron deficiency anemia, unspecified: Secondary | ICD-10-CM | POA: Diagnosis not present

## 2020-09-26 DIAGNOSIS — N186 End stage renal disease: Secondary | ICD-10-CM | POA: Diagnosis not present

## 2020-09-28 DIAGNOSIS — N186 End stage renal disease: Secondary | ICD-10-CM | POA: Diagnosis not present

## 2020-09-28 DIAGNOSIS — D509 Iron deficiency anemia, unspecified: Secondary | ICD-10-CM | POA: Diagnosis not present

## 2020-09-28 DIAGNOSIS — N2581 Secondary hyperparathyroidism of renal origin: Secondary | ICD-10-CM | POA: Diagnosis not present

## 2020-09-28 DIAGNOSIS — D631 Anemia in chronic kidney disease: Secondary | ICD-10-CM | POA: Diagnosis not present

## 2020-10-01 DIAGNOSIS — D509 Iron deficiency anemia, unspecified: Secondary | ICD-10-CM | POA: Diagnosis not present

## 2020-10-01 DIAGNOSIS — D631 Anemia in chronic kidney disease: Secondary | ICD-10-CM | POA: Diagnosis not present

## 2020-10-01 DIAGNOSIS — N186 End stage renal disease: Secondary | ICD-10-CM | POA: Diagnosis not present

## 2020-10-01 DIAGNOSIS — N2581 Secondary hyperparathyroidism of renal origin: Secondary | ICD-10-CM | POA: Diagnosis not present

## 2020-10-03 DIAGNOSIS — N2581 Secondary hyperparathyroidism of renal origin: Secondary | ICD-10-CM | POA: Diagnosis not present

## 2020-10-03 DIAGNOSIS — N186 End stage renal disease: Secondary | ICD-10-CM | POA: Diagnosis not present

## 2020-10-03 DIAGNOSIS — D509 Iron deficiency anemia, unspecified: Secondary | ICD-10-CM | POA: Diagnosis not present

## 2020-10-03 DIAGNOSIS — D631 Anemia in chronic kidney disease: Secondary | ICD-10-CM | POA: Diagnosis not present

## 2020-10-05 DIAGNOSIS — N186 End stage renal disease: Secondary | ICD-10-CM | POA: Diagnosis not present

## 2020-10-05 DIAGNOSIS — N2581 Secondary hyperparathyroidism of renal origin: Secondary | ICD-10-CM | POA: Diagnosis not present

## 2020-10-05 DIAGNOSIS — D631 Anemia in chronic kidney disease: Secondary | ICD-10-CM | POA: Diagnosis not present

## 2020-10-05 DIAGNOSIS — D509 Iron deficiency anemia, unspecified: Secondary | ICD-10-CM | POA: Diagnosis not present

## 2020-10-08 DIAGNOSIS — N2581 Secondary hyperparathyroidism of renal origin: Secondary | ICD-10-CM | POA: Diagnosis not present

## 2020-10-08 DIAGNOSIS — N186 End stage renal disease: Secondary | ICD-10-CM | POA: Diagnosis not present

## 2020-10-08 DIAGNOSIS — D631 Anemia in chronic kidney disease: Secondary | ICD-10-CM | POA: Diagnosis not present

## 2020-10-08 DIAGNOSIS — D509 Iron deficiency anemia, unspecified: Secondary | ICD-10-CM | POA: Diagnosis not present

## 2020-10-10 DIAGNOSIS — D509 Iron deficiency anemia, unspecified: Secondary | ICD-10-CM | POA: Diagnosis not present

## 2020-10-10 DIAGNOSIS — D631 Anemia in chronic kidney disease: Secondary | ICD-10-CM | POA: Diagnosis not present

## 2020-10-10 DIAGNOSIS — N186 End stage renal disease: Secondary | ICD-10-CM | POA: Diagnosis not present

## 2020-10-10 DIAGNOSIS — N2581 Secondary hyperparathyroidism of renal origin: Secondary | ICD-10-CM | POA: Diagnosis not present

## 2020-10-12 DIAGNOSIS — D631 Anemia in chronic kidney disease: Secondary | ICD-10-CM | POA: Diagnosis not present

## 2020-10-12 DIAGNOSIS — N186 End stage renal disease: Secondary | ICD-10-CM | POA: Diagnosis not present

## 2020-10-12 DIAGNOSIS — D509 Iron deficiency anemia, unspecified: Secondary | ICD-10-CM | POA: Diagnosis not present

## 2020-10-12 DIAGNOSIS — N2581 Secondary hyperparathyroidism of renal origin: Secondary | ICD-10-CM | POA: Diagnosis not present

## 2020-10-15 DIAGNOSIS — N2581 Secondary hyperparathyroidism of renal origin: Secondary | ICD-10-CM | POA: Diagnosis not present

## 2020-10-15 DIAGNOSIS — D509 Iron deficiency anemia, unspecified: Secondary | ICD-10-CM | POA: Diagnosis not present

## 2020-10-15 DIAGNOSIS — N186 End stage renal disease: Secondary | ICD-10-CM | POA: Diagnosis not present

## 2020-10-15 DIAGNOSIS — D631 Anemia in chronic kidney disease: Secondary | ICD-10-CM | POA: Diagnosis not present

## 2020-10-17 DIAGNOSIS — N186 End stage renal disease: Secondary | ICD-10-CM | POA: Diagnosis not present

## 2020-10-17 DIAGNOSIS — N2581 Secondary hyperparathyroidism of renal origin: Secondary | ICD-10-CM | POA: Diagnosis not present

## 2020-10-17 DIAGNOSIS — D631 Anemia in chronic kidney disease: Secondary | ICD-10-CM | POA: Diagnosis not present

## 2020-10-17 DIAGNOSIS — D509 Iron deficiency anemia, unspecified: Secondary | ICD-10-CM | POA: Diagnosis not present

## 2020-10-19 DIAGNOSIS — D509 Iron deficiency anemia, unspecified: Secondary | ICD-10-CM | POA: Diagnosis not present

## 2020-10-19 DIAGNOSIS — N2581 Secondary hyperparathyroidism of renal origin: Secondary | ICD-10-CM | POA: Diagnosis not present

## 2020-10-19 DIAGNOSIS — D631 Anemia in chronic kidney disease: Secondary | ICD-10-CM | POA: Diagnosis not present

## 2020-10-19 DIAGNOSIS — N186 End stage renal disease: Secondary | ICD-10-CM | POA: Diagnosis not present

## 2020-10-22 DIAGNOSIS — N2581 Secondary hyperparathyroidism of renal origin: Secondary | ICD-10-CM | POA: Diagnosis not present

## 2020-10-22 DIAGNOSIS — D631 Anemia in chronic kidney disease: Secondary | ICD-10-CM | POA: Diagnosis not present

## 2020-10-22 DIAGNOSIS — N186 End stage renal disease: Secondary | ICD-10-CM | POA: Diagnosis not present

## 2020-10-22 DIAGNOSIS — D509 Iron deficiency anemia, unspecified: Secondary | ICD-10-CM | POA: Diagnosis not present

## 2020-10-24 DIAGNOSIS — N186 End stage renal disease: Secondary | ICD-10-CM | POA: Diagnosis not present

## 2020-10-24 DIAGNOSIS — D509 Iron deficiency anemia, unspecified: Secondary | ICD-10-CM | POA: Diagnosis not present

## 2020-10-24 DIAGNOSIS — N2581 Secondary hyperparathyroidism of renal origin: Secondary | ICD-10-CM | POA: Diagnosis not present

## 2020-10-24 DIAGNOSIS — D631 Anemia in chronic kidney disease: Secondary | ICD-10-CM | POA: Diagnosis not present

## 2020-10-26 DIAGNOSIS — D509 Iron deficiency anemia, unspecified: Secondary | ICD-10-CM | POA: Diagnosis not present

## 2020-10-26 DIAGNOSIS — I10 Essential (primary) hypertension: Secondary | ICD-10-CM | POA: Diagnosis not present

## 2020-10-26 DIAGNOSIS — D631 Anemia in chronic kidney disease: Secondary | ICD-10-CM | POA: Diagnosis not present

## 2020-10-26 DIAGNOSIS — N2581 Secondary hyperparathyroidism of renal origin: Secondary | ICD-10-CM | POA: Diagnosis not present

## 2020-10-26 DIAGNOSIS — N186 End stage renal disease: Secondary | ICD-10-CM | POA: Diagnosis not present

## 2020-10-27 DIAGNOSIS — D631 Anemia in chronic kidney disease: Secondary | ICD-10-CM | POA: Diagnosis not present

## 2020-10-27 DIAGNOSIS — N186 End stage renal disease: Secondary | ICD-10-CM | POA: Diagnosis not present

## 2020-10-27 DIAGNOSIS — I10 Essential (primary) hypertension: Secondary | ICD-10-CM | POA: Diagnosis not present

## 2020-10-29 DIAGNOSIS — D631 Anemia in chronic kidney disease: Secondary | ICD-10-CM | POA: Diagnosis not present

## 2020-10-29 DIAGNOSIS — D509 Iron deficiency anemia, unspecified: Secondary | ICD-10-CM | POA: Diagnosis not present

## 2020-10-29 DIAGNOSIS — N2581 Secondary hyperparathyroidism of renal origin: Secondary | ICD-10-CM | POA: Diagnosis not present

## 2020-10-29 DIAGNOSIS — I10 Essential (primary) hypertension: Secondary | ICD-10-CM | POA: Diagnosis not present

## 2020-10-29 DIAGNOSIS — N186 End stage renal disease: Secondary | ICD-10-CM | POA: Diagnosis not present

## 2020-10-31 DIAGNOSIS — D509 Iron deficiency anemia, unspecified: Secondary | ICD-10-CM | POA: Diagnosis not present

## 2020-10-31 DIAGNOSIS — N2581 Secondary hyperparathyroidism of renal origin: Secondary | ICD-10-CM | POA: Diagnosis not present

## 2020-10-31 DIAGNOSIS — D631 Anemia in chronic kidney disease: Secondary | ICD-10-CM | POA: Diagnosis not present

## 2020-10-31 DIAGNOSIS — N186 End stage renal disease: Secondary | ICD-10-CM | POA: Diagnosis not present

## 2020-10-31 DIAGNOSIS — I10 Essential (primary) hypertension: Secondary | ICD-10-CM | POA: Diagnosis not present

## 2020-11-01 ENCOUNTER — Ambulatory Visit: Payer: Medicare HMO

## 2020-11-01 DIAGNOSIS — I120 Hypertensive chronic kidney disease with stage 5 chronic kidney disease or end stage renal disease: Secondary | ICD-10-CM

## 2020-11-01 DIAGNOSIS — Z992 Dependence on renal dialysis: Secondary | ICD-10-CM

## 2020-11-01 DIAGNOSIS — N186 End stage renal disease: Secondary | ICD-10-CM

## 2020-11-01 NOTE — Patient Instructions (Signed)
Social Worker Visit Information  Goals we discussed today:  Goals Addressed            This Visit's Progress   . Work with SW to manage care coordination needs related to ESRD       Timeframe:  Long-Range Goal Priority:  Low Start Date:   1.13.22                          Expected End Date: 5.13.22                      Next planned outreach date: 7.7.22  Patient Goals/Self-Care Activities Over the next 90 days, patient will:   - Patient will self administer medications as prescribed Patient will attend all scheduled provider appointments Patient will call provider office for new concerns or questions Contact SW as needed prior to next scheduled call        Materials Provided: No: Patient declined  Follow Up Plan: SW will follow up with patient by phone over the next 90 days   Daneen Schick, BSW, CDP Social Worker, Certified Dementia Practitioner [place] [facility] [phone]

## 2020-11-01 NOTE — Chronic Care Management (AMB) (Signed)
Chronic Care Management    Social Work Note  11/01/2020 Name: Jennifer Payne MRN: [id number] DOB: Feb 16, 1951  Jennifer Payne is a 70 y.o. year old female who is a primary care patient of Glendale Chard, MD. The CCM team was consulted to assist the patient with chronic disease management and/or care coordination needs related to: [employer] .   Engaged with patient by telephone for follow up visit in response to provider referral for social work chronic care management and care coordination services.   Consent to Services:  The patient was given information about Chronic Care Management services, agreed to services, and gave verbal consent prior to initiation of services.  Please see initial visit note for detailed documentation.   Patient agreed to services and consent obtained.   Assessment: Review of patient past medical history, allergies, medications, and health status, including review of relevant consultants reports was performed today as part of a comprehensive evaluation and provision of chronic care management and care coordination services.     SDOH (Social Determinants of Health) assessments and interventions performed:    Advanced Directives Status: Not addressed in this encounter.  CCM Care Plan  Allergies  Allergen Reactions  . Erythromycin Rash    Outpatient Encounter Medications as of 11/01/2020  Medication Sig  . amLODipine (NORVASC) 5 MG tablet  (Patient not taking: Reported on 05/03/2020)  . atorvastatin (LIPITOR) 80 MG tablet Take 1 tablet by mouth once daily  . BD PEN NEEDLE NANO U/F 32G X 4 MM MISC USE AS DIRECTED WITH LEVEMIR PEN  . calcitRIOL (ROCALTROL) 0.5 MCG capsule Take 0.5 mcg by mouth daily.  . clobetasol ointment (TEMOVATE) 8.41 % Apply 1 application topically 2 (two) times daily. (Patient not taking: Reported on 05/03/2020)  . Continuous Blood Gluc Receiver (FREESTYLE LIBRE 14 DAY READER) DEVI Inject 1 each into the skin 4 (four) times  daily. To check blood sugars dx: e11.22  . Continuous Blood Gluc Sensor (FREESTYLE LIBRE 14 DAY SENSOR) MISC Inject 1 each into the skin QID. Dx:e11.22  . EUTHYROX 88 MCG tablet Take 1 tablet by mouth once daily  . glucose blood (ONETOUCH VERIO) test strip Use as instructed to check blood sugars  3 times per day dx: e11.65  . glucose blood test strip OneTouch Verio test strips  USE 1 TEST STRIP TO CHECK BLOOD SUGAR THREE TIMES DAILY AS INSTRUCTED  . hydrALAZINE (APRESOLINE) 25 MG tablet  (Patient not taking: Reported on 05/03/2020)  . LEVEMIR FLEXTOUCH 100 UNIT/ML Pen INJECT 20 UNITS SUBCUTANEOUSLY ONCE DAILY AT BEDTIME (Patient not taking: Reported on 05/03/2020)  . metolazone (ZAROXOLYN) 2.5 MG tablet Monday, Wednesday, Friday (Patient not taking: Reported on 05/03/2020)  . metoprolol succinate (TOPROL-XL) 100 MG 24 hr tablet TAKE 1 TABLET BY MOUTH ONCE DAILY IN THE MORNING (Patient not taking: Reported on 05/03/2020)  . OneTouch Delica Lancets 66A MISC Use as instructed to check blood sugars  3 times per day dx: e11.65  . torsemide (DEMADEX) 20 MG tablet  (Patient not taking: Reported on 05/03/2020)   No facility-administered encounter medications on file as of 11/01/2020.    Patient Active Problem List   Diagnosis Date Noted  . Pain in toes of both feet 05/20/2020  . Class 1 obesity due to excess calories with serious comorbidity and body mass index (BMI) of 34.0 to 34.9 in adult 05/20/2020  . Type 2 diabetes mellitus with stage 4 chronic kidney disease, with long-term current use of insulin ([place]) 08/19/2018  .  Hypertensive nephropathy 08/19/2018  . Primary hypothyroidism 08/19/2018  . Proliferative diabetic retinopathy associated with type 2 diabetes mellitus ([place]) 08/19/2018  . Encounter for general adult medical examination without abnormal findings 01/14/2018    Conditions to be addressed/monitored: DMII and ESRD  Care Plan : Social Work [facility] Plan of Care  Updates made by Daneen Schick since 11/01/2020 12:00 AM    Problem: Disease Progression - ESRD     Long-Range Goal: Care Coordination needs related to ESRD   Start Date: 08/09/2020  Expected End Date: 12/07/2020  This Visit's Progress: On track  Recent Progress: On track  Priority: Low  Note:   Current Barriers:  . Limited social support . Transportation . Chronic conditions including DM II, ESRD, and Hypertensive Nephropathy putting patient at increased risk of hospitalization  Social Work Clinical Goal(s):  Marland Kitchen Over the next 120 days, patient will work with SW to address concerns related to care coordination  Interventions: . 1:1 collaboration with Glendale Chard, MD regarding development and update of comprehensive plan of care as evidenced by provider attestation and co-signature . Inter-disciplinary care team collaboration (see longitudinal plan of care) . Successful outbound call placed to the patient to assess for care coordination needs . Discussed the patient continues to transport herself to and from Dialysis and is not interested in transportation resources at this time . 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 Encouraged the patient to contact her primary providers office to schedule an appointment . The patient reports she may call in the next few weeks if needed . Scheduled follow up call over the next 90 days . Advised the patient to contact SW as needed prior to next scheduled call  Patient Goals/Self-Care Activities Over the next 90 days, patient will:   - Patient will self administer medications as prescribed Patient will attend all scheduled provider appointments Patient will call provider office for new concerns or questions Contact SW as needed prior to next scheduled call  Follow up Plan: SW will follow up with patient by phone over the next 90 days       Follow Up Plan: SW will follow up with patient by phone over the next 90 days      Daneen Schick, BSW,  CDP Social Worker, Certified Dementia Practitioner [place] [facility] [phone]  Total time spent performing care coordination and/or care management activities with the patient by phone or face to face = 15 minutes.

## 2020-11-02 DIAGNOSIS — D631 Anemia in chronic kidney disease: Secondary | ICD-10-CM | POA: Diagnosis not present

## 2020-11-02 DIAGNOSIS — I10 Essential (primary) hypertension: Secondary | ICD-10-CM | POA: Diagnosis not present

## 2020-11-02 DIAGNOSIS — N2581 Secondary hyperparathyroidism of renal origin: Secondary | ICD-10-CM | POA: Diagnosis not present

## 2020-11-02 DIAGNOSIS — D509 Iron deficiency anemia, unspecified: Secondary | ICD-10-CM | POA: Diagnosis not present

## 2020-11-02 DIAGNOSIS — N186 End stage renal disease: Secondary | ICD-10-CM | POA: Diagnosis not present

## 2020-11-05 DIAGNOSIS — D631 Anemia in chronic kidney disease: Secondary | ICD-10-CM | POA: Diagnosis not present

## 2020-11-05 DIAGNOSIS — N2581 Secondary hyperparathyroidism of renal origin: Secondary | ICD-10-CM | POA: Diagnosis not present

## 2020-11-05 DIAGNOSIS — N186 End stage renal disease: Secondary | ICD-10-CM | POA: Diagnosis not present

## 2020-11-05 DIAGNOSIS — D509 Iron deficiency anemia, unspecified: Secondary | ICD-10-CM | POA: Diagnosis not present

## 2020-11-05 DIAGNOSIS — I10 Essential (primary) hypertension: Secondary | ICD-10-CM | POA: Diagnosis not present

## 2020-11-07 DIAGNOSIS — D631 Anemia in chronic kidney disease: Secondary | ICD-10-CM | POA: Diagnosis not present

## 2020-11-07 DIAGNOSIS — N2581 Secondary hyperparathyroidism of renal origin: Secondary | ICD-10-CM | POA: Diagnosis not present

## 2020-11-07 DIAGNOSIS — I10 Essential (primary) hypertension: Secondary | ICD-10-CM | POA: Diagnosis not present

## 2020-11-07 DIAGNOSIS — N186 End stage renal disease: Secondary | ICD-10-CM | POA: Diagnosis not present

## 2020-11-07 DIAGNOSIS — D509 Iron deficiency anemia, unspecified: Secondary | ICD-10-CM | POA: Diagnosis not present

## 2020-11-09 DIAGNOSIS — N186 End stage renal disease: Secondary | ICD-10-CM | POA: Diagnosis not present

## 2020-11-09 DIAGNOSIS — I10 Essential (primary) hypertension: Secondary | ICD-10-CM | POA: Diagnosis not present

## 2020-11-09 DIAGNOSIS — D631 Anemia in chronic kidney disease: Secondary | ICD-10-CM | POA: Diagnosis not present

## 2020-11-09 DIAGNOSIS — D509 Iron deficiency anemia, unspecified: Secondary | ICD-10-CM | POA: Diagnosis not present

## 2020-11-09 DIAGNOSIS — N2581 Secondary hyperparathyroidism of renal origin: Secondary | ICD-10-CM | POA: Diagnosis not present

## 2020-11-12 DIAGNOSIS — D509 Iron deficiency anemia, unspecified: Secondary | ICD-10-CM | POA: Diagnosis not present

## 2020-11-12 DIAGNOSIS — I10 Essential (primary) hypertension: Secondary | ICD-10-CM | POA: Diagnosis not present

## 2020-11-12 DIAGNOSIS — N186 End stage renal disease: Secondary | ICD-10-CM | POA: Diagnosis not present

## 2020-11-12 DIAGNOSIS — D631 Anemia in chronic kidney disease: Secondary | ICD-10-CM | POA: Diagnosis not present

## 2020-11-12 DIAGNOSIS — N2581 Secondary hyperparathyroidism of renal origin: Secondary | ICD-10-CM | POA: Diagnosis not present

## 2020-11-14 DIAGNOSIS — D509 Iron deficiency anemia, unspecified: Secondary | ICD-10-CM | POA: Diagnosis not present

## 2020-11-14 DIAGNOSIS — N186 End stage renal disease: Secondary | ICD-10-CM | POA: Diagnosis not present

## 2020-11-14 DIAGNOSIS — D631 Anemia in chronic kidney disease: Secondary | ICD-10-CM | POA: Diagnosis not present

## 2020-11-14 DIAGNOSIS — N2581 Secondary hyperparathyroidism of renal origin: Secondary | ICD-10-CM | POA: Diagnosis not present

## 2020-11-14 DIAGNOSIS — I10 Essential (primary) hypertension: Secondary | ICD-10-CM | POA: Diagnosis not present

## 2020-11-15 DIAGNOSIS — M65341 Trigger finger, right ring finger: Secondary | ICD-10-CM | POA: Diagnosis not present

## 2020-11-16 DIAGNOSIS — I10 Essential (primary) hypertension: Secondary | ICD-10-CM | POA: Diagnosis not present

## 2020-11-16 DIAGNOSIS — N2581 Secondary hyperparathyroidism of renal origin: Secondary | ICD-10-CM | POA: Diagnosis not present

## 2020-11-16 DIAGNOSIS — D631 Anemia in chronic kidney disease: Secondary | ICD-10-CM | POA: Diagnosis not present

## 2020-11-16 DIAGNOSIS — N186 End stage renal disease: Secondary | ICD-10-CM | POA: Diagnosis not present

## 2020-11-16 DIAGNOSIS — D509 Iron deficiency anemia, unspecified: Secondary | ICD-10-CM | POA: Diagnosis not present

## 2020-11-19 DIAGNOSIS — D631 Anemia in chronic kidney disease: Secondary | ICD-10-CM | POA: Diagnosis not present

## 2020-11-19 DIAGNOSIS — N2581 Secondary hyperparathyroidism of renal origin: Secondary | ICD-10-CM | POA: Diagnosis not present

## 2020-11-19 DIAGNOSIS — D509 Iron deficiency anemia, unspecified: Secondary | ICD-10-CM | POA: Diagnosis not present

## 2020-11-19 DIAGNOSIS — I10 Essential (primary) hypertension: Secondary | ICD-10-CM | POA: Diagnosis not present

## 2020-11-19 DIAGNOSIS — N186 End stage renal disease: Secondary | ICD-10-CM | POA: Diagnosis not present

## 2020-11-21 DIAGNOSIS — N186 End stage renal disease: Secondary | ICD-10-CM | POA: Diagnosis not present

## 2020-11-21 DIAGNOSIS — I10 Essential (primary) hypertension: Secondary | ICD-10-CM | POA: Diagnosis not present

## 2020-11-21 DIAGNOSIS — D509 Iron deficiency anemia, unspecified: Secondary | ICD-10-CM | POA: Diagnosis not present

## 2020-11-21 DIAGNOSIS — D631 Anemia in chronic kidney disease: Secondary | ICD-10-CM | POA: Diagnosis not present

## 2020-11-21 DIAGNOSIS — N2581 Secondary hyperparathyroidism of renal origin: Secondary | ICD-10-CM | POA: Diagnosis not present

## 2020-11-23 DIAGNOSIS — D509 Iron deficiency anemia, unspecified: Secondary | ICD-10-CM | POA: Diagnosis not present

## 2020-11-23 DIAGNOSIS — D631 Anemia in chronic kidney disease: Secondary | ICD-10-CM | POA: Diagnosis not present

## 2020-11-23 DIAGNOSIS — N2581 Secondary hyperparathyroidism of renal origin: Secondary | ICD-10-CM | POA: Diagnosis not present

## 2020-11-23 DIAGNOSIS — N186 End stage renal disease: Secondary | ICD-10-CM | POA: Diagnosis not present

## 2020-11-23 DIAGNOSIS — I10 Essential (primary) hypertension: Secondary | ICD-10-CM | POA: Diagnosis not present

## 2020-11-25 DIAGNOSIS — D631 Anemia in chronic kidney disease: Secondary | ICD-10-CM | POA: Diagnosis not present

## 2020-11-25 DIAGNOSIS — N186 End stage renal disease: Secondary | ICD-10-CM | POA: Diagnosis not present

## 2020-11-25 DIAGNOSIS — I10 Essential (primary) hypertension: Secondary | ICD-10-CM | POA: Diagnosis not present

## 2020-11-26 DIAGNOSIS — D509 Iron deficiency anemia, unspecified: Secondary | ICD-10-CM | POA: Diagnosis not present

## 2020-11-26 DIAGNOSIS — N186 End stage renal disease: Secondary | ICD-10-CM | POA: Diagnosis not present

## 2020-11-26 DIAGNOSIS — D631 Anemia in chronic kidney disease: Secondary | ICD-10-CM | POA: Diagnosis not present

## 2020-11-26 DIAGNOSIS — N2581 Secondary hyperparathyroidism of renal origin: Secondary | ICD-10-CM | POA: Diagnosis not present

## 2020-11-27 DIAGNOSIS — E113592 Type 2 diabetes mellitus with proliferative diabetic retinopathy without macular edema, left eye: Secondary | ICD-10-CM | POA: Diagnosis not present

## 2020-11-27 DIAGNOSIS — E113511 Type 2 diabetes mellitus with proliferative diabetic retinopathy with macular edema, right eye: Secondary | ICD-10-CM | POA: Diagnosis not present

## 2020-11-27 DIAGNOSIS — H35371 Puckering of macula, right eye: Secondary | ICD-10-CM | POA: Diagnosis not present

## 2020-11-27 DIAGNOSIS — H43821 Vitreomacular adhesion, right eye: Secondary | ICD-10-CM | POA: Diagnosis not present

## 2020-11-28 DIAGNOSIS — N2581 Secondary hyperparathyroidism of renal origin: Secondary | ICD-10-CM | POA: Diagnosis not present

## 2020-11-28 DIAGNOSIS — E039 Hypothyroidism, unspecified: Secondary | ICD-10-CM | POA: Diagnosis not present

## 2020-11-28 DIAGNOSIS — D509 Iron deficiency anemia, unspecified: Secondary | ICD-10-CM | POA: Diagnosis not present

## 2020-11-28 DIAGNOSIS — D631 Anemia in chronic kidney disease: Secondary | ICD-10-CM | POA: Diagnosis not present

## 2020-11-28 DIAGNOSIS — N186 End stage renal disease: Secondary | ICD-10-CM | POA: Diagnosis not present

## 2020-11-29 ENCOUNTER — Telehealth: Payer: Medicare HMO

## 2020-11-30 DIAGNOSIS — N186 End stage renal disease: Secondary | ICD-10-CM | POA: Diagnosis not present

## 2020-11-30 DIAGNOSIS — N2581 Secondary hyperparathyroidism of renal origin: Secondary | ICD-10-CM | POA: Diagnosis not present

## 2020-11-30 DIAGNOSIS — D509 Iron deficiency anemia, unspecified: Secondary | ICD-10-CM | POA: Diagnosis not present

## 2020-11-30 DIAGNOSIS — D631 Anemia in chronic kidney disease: Secondary | ICD-10-CM | POA: Diagnosis not present

## 2020-12-03 DIAGNOSIS — N2581 Secondary hyperparathyroidism of renal origin: Secondary | ICD-10-CM | POA: Diagnosis not present

## 2020-12-03 DIAGNOSIS — N186 End stage renal disease: Secondary | ICD-10-CM | POA: Diagnosis not present

## 2020-12-03 DIAGNOSIS — D509 Iron deficiency anemia, unspecified: Secondary | ICD-10-CM | POA: Diagnosis not present

## 2020-12-03 DIAGNOSIS — D631 Anemia in chronic kidney disease: Secondary | ICD-10-CM | POA: Diagnosis not present

## 2020-12-05 DIAGNOSIS — D631 Anemia in chronic kidney disease: Secondary | ICD-10-CM | POA: Diagnosis not present

## 2020-12-05 DIAGNOSIS — N2581 Secondary hyperparathyroidism of renal origin: Secondary | ICD-10-CM | POA: Diagnosis not present

## 2020-12-05 DIAGNOSIS — D509 Iron deficiency anemia, unspecified: Secondary | ICD-10-CM | POA: Diagnosis not present

## 2020-12-05 DIAGNOSIS — N186 End stage renal disease: Secondary | ICD-10-CM | POA: Diagnosis not present

## 2020-12-07 DIAGNOSIS — N186 End stage renal disease: Secondary | ICD-10-CM | POA: Diagnosis not present

## 2020-12-07 DIAGNOSIS — D509 Iron deficiency anemia, unspecified: Secondary | ICD-10-CM | POA: Diagnosis not present

## 2020-12-07 DIAGNOSIS — N2581 Secondary hyperparathyroidism of renal origin: Secondary | ICD-10-CM | POA: Diagnosis not present

## 2020-12-07 DIAGNOSIS — D631 Anemia in chronic kidney disease: Secondary | ICD-10-CM | POA: Diagnosis not present

## 2020-12-10 DIAGNOSIS — N186 End stage renal disease: Secondary | ICD-10-CM | POA: Diagnosis not present

## 2020-12-10 DIAGNOSIS — D631 Anemia in chronic kidney disease: Secondary | ICD-10-CM | POA: Diagnosis not present

## 2020-12-10 DIAGNOSIS — N2581 Secondary hyperparathyroidism of renal origin: Secondary | ICD-10-CM | POA: Diagnosis not present

## 2020-12-10 DIAGNOSIS — D509 Iron deficiency anemia, unspecified: Secondary | ICD-10-CM | POA: Diagnosis not present

## 2020-12-12 DIAGNOSIS — D509 Iron deficiency anemia, unspecified: Secondary | ICD-10-CM | POA: Diagnosis not present

## 2020-12-12 DIAGNOSIS — D631 Anemia in chronic kidney disease: Secondary | ICD-10-CM | POA: Diagnosis not present

## 2020-12-12 DIAGNOSIS — N2581 Secondary hyperparathyroidism of renal origin: Secondary | ICD-10-CM | POA: Diagnosis not present

## 2020-12-12 DIAGNOSIS — N186 End stage renal disease: Secondary | ICD-10-CM | POA: Diagnosis not present

## 2020-12-14 DIAGNOSIS — N2581 Secondary hyperparathyroidism of renal origin: Secondary | ICD-10-CM | POA: Diagnosis not present

## 2020-12-14 DIAGNOSIS — D631 Anemia in chronic kidney disease: Secondary | ICD-10-CM | POA: Diagnosis not present

## 2020-12-14 DIAGNOSIS — D509 Iron deficiency anemia, unspecified: Secondary | ICD-10-CM | POA: Diagnosis not present

## 2020-12-14 DIAGNOSIS — N186 End stage renal disease: Secondary | ICD-10-CM | POA: Diagnosis not present

## 2020-12-17 DIAGNOSIS — N2581 Secondary hyperparathyroidism of renal origin: Secondary | ICD-10-CM | POA: Diagnosis not present

## 2020-12-17 DIAGNOSIS — N186 End stage renal disease: Secondary | ICD-10-CM | POA: Diagnosis not present

## 2020-12-17 DIAGNOSIS — D509 Iron deficiency anemia, unspecified: Secondary | ICD-10-CM | POA: Diagnosis not present

## 2020-12-17 DIAGNOSIS — D631 Anemia in chronic kidney disease: Secondary | ICD-10-CM | POA: Diagnosis not present

## 2020-12-19 DIAGNOSIS — D631 Anemia in chronic kidney disease: Secondary | ICD-10-CM | POA: Diagnosis not present

## 2020-12-19 DIAGNOSIS — N186 End stage renal disease: Secondary | ICD-10-CM | POA: Diagnosis not present

## 2020-12-19 DIAGNOSIS — D509 Iron deficiency anemia, unspecified: Secondary | ICD-10-CM | POA: Diagnosis not present

## 2020-12-19 DIAGNOSIS — N2581 Secondary hyperparathyroidism of renal origin: Secondary | ICD-10-CM | POA: Diagnosis not present

## 2020-12-21 ENCOUNTER — Other Ambulatory Visit: Payer: Self-pay | Admitting: Internal Medicine

## 2020-12-21 DIAGNOSIS — D631 Anemia in chronic kidney disease: Secondary | ICD-10-CM | POA: Diagnosis not present

## 2020-12-21 DIAGNOSIS — D509 Iron deficiency anemia, unspecified: Secondary | ICD-10-CM | POA: Diagnosis not present

## 2020-12-21 DIAGNOSIS — N2581 Secondary hyperparathyroidism of renal origin: Secondary | ICD-10-CM | POA: Diagnosis not present

## 2020-12-21 DIAGNOSIS — N186 End stage renal disease: Secondary | ICD-10-CM | POA: Diagnosis not present

## 2020-12-24 DIAGNOSIS — N186 End stage renal disease: Secondary | ICD-10-CM | POA: Diagnosis not present

## 2020-12-24 DIAGNOSIS — N2581 Secondary hyperparathyroidism of renal origin: Secondary | ICD-10-CM | POA: Diagnosis not present

## 2020-12-24 DIAGNOSIS — D631 Anemia in chronic kidney disease: Secondary | ICD-10-CM | POA: Diagnosis not present

## 2020-12-24 DIAGNOSIS — D509 Iron deficiency anemia, unspecified: Secondary | ICD-10-CM | POA: Diagnosis not present

## 2020-12-26 DIAGNOSIS — D631 Anemia in chronic kidney disease: Secondary | ICD-10-CM | POA: Diagnosis not present

## 2020-12-26 DIAGNOSIS — E8779 Other fluid overload: Secondary | ICD-10-CM | POA: Diagnosis not present

## 2020-12-26 DIAGNOSIS — I10 Essential (primary) hypertension: Secondary | ICD-10-CM | POA: Diagnosis not present

## 2020-12-26 DIAGNOSIS — D509 Iron deficiency anemia, unspecified: Secondary | ICD-10-CM | POA: Diagnosis not present

## 2020-12-26 DIAGNOSIS — E039 Hypothyroidism, unspecified: Secondary | ICD-10-CM | POA: Diagnosis not present

## 2020-12-26 DIAGNOSIS — N186 End stage renal disease: Secondary | ICD-10-CM | POA: Diagnosis not present

## 2020-12-26 DIAGNOSIS — N2581 Secondary hyperparathyroidism of renal origin: Secondary | ICD-10-CM | POA: Diagnosis not present

## 2020-12-28 DIAGNOSIS — N186 End stage renal disease: Secondary | ICD-10-CM | POA: Diagnosis not present

## 2020-12-28 DIAGNOSIS — D509 Iron deficiency anemia, unspecified: Secondary | ICD-10-CM | POA: Diagnosis not present

## 2020-12-28 DIAGNOSIS — E8779 Other fluid overload: Secondary | ICD-10-CM | POA: Diagnosis not present

## 2020-12-28 DIAGNOSIS — N2581 Secondary hyperparathyroidism of renal origin: Secondary | ICD-10-CM | POA: Diagnosis not present

## 2020-12-28 DIAGNOSIS — D631 Anemia in chronic kidney disease: Secondary | ICD-10-CM | POA: Diagnosis not present

## 2020-12-31 DIAGNOSIS — D631 Anemia in chronic kidney disease: Secondary | ICD-10-CM | POA: Diagnosis not present

## 2020-12-31 DIAGNOSIS — D509 Iron deficiency anemia, unspecified: Secondary | ICD-10-CM | POA: Diagnosis not present

## 2020-12-31 DIAGNOSIS — E8779 Other fluid overload: Secondary | ICD-10-CM | POA: Diagnosis not present

## 2020-12-31 DIAGNOSIS — N2581 Secondary hyperparathyroidism of renal origin: Secondary | ICD-10-CM | POA: Diagnosis not present

## 2020-12-31 DIAGNOSIS — N186 End stage renal disease: Secondary | ICD-10-CM | POA: Diagnosis not present

## 2021-01-01 ENCOUNTER — Ambulatory Visit (INDEPENDENT_AMBULATORY_CARE_PROVIDER_SITE_OTHER): Payer: Medicare HMO

## 2021-01-01 ENCOUNTER — Telehealth: Payer: Medicare HMO

## 2021-01-01 DIAGNOSIS — I120 Hypertensive chronic kidney disease with stage 5 chronic kidney disease or end stage renal disease: Secondary | ICD-10-CM

## 2021-01-01 DIAGNOSIS — N186 End stage renal disease: Secondary | ICD-10-CM | POA: Diagnosis not present

## 2021-01-01 DIAGNOSIS — Z992 Dependence on renal dialysis: Secondary | ICD-10-CM | POA: Diagnosis not present

## 2021-01-01 DIAGNOSIS — E1122 Type 2 diabetes mellitus with diabetic chronic kidney disease: Secondary | ICD-10-CM

## 2021-01-01 DIAGNOSIS — I129 Hypertensive chronic kidney disease with stage 1 through stage 4 chronic kidney disease, or unspecified chronic kidney disease: Secondary | ICD-10-CM | POA: Diagnosis not present

## 2021-01-01 NOTE — Patient Instructions (Signed)
Goals Addressed      Patient Stated   .  Monitor and Manage My Blood Sugar (pt-stated)   On track     Timeframe:  Long-Range Goal Priority:  Medium Start Date: 06/28/20                            Expected End Date:  06/28/21                      Follow Up Date: 05/27/21  Self administers insulin as prescribed Attend all scheduled provider appointments Check blood sugars as prescribed and utilize hyper and hypoglycemia protocol as needed Adhere to prescribed ADA/carb modified   Why is this important?    Checking your blood sugar at home helps to keep it from getting very high or very low.   Writing the results in a diary or log helps the doctor know how to care for you.   Your blood sugar log should have the time, date and the results.   Also, write down the amount of insulin or other medicine that you take.   Other information, like what you ate, exercise done and how you were feeling, will also be helpful.          Other   .  Obtain Eye Exam        Timeframe:  Long-Range Goal Priority:  High Start Date: 06/28/20                            Expected End Date: 06/28/21                   - keep appointment with eye doctor - schedule appointment with eye doctor    Why is this important?    Eye check-ups are important when you have diabetes.   Vision loss can be prevented.       Marland Kitchen  Perform Foot Care        Timeframe:  Long-Range Goal Priority:  Medium Start Date: 06/28/20                            Expected End Date: 06/28/21                        - check feet daily for cuts, sores or redness - trim toenails straight across - wash and dry feet carefully every day - wear comfortable, cotton socks - wear comfortable, well-fitting shoes    Why is this important?    Good foot care is very important when you have diabetes.   There are many things you can do to keep your feet healthy and catch a problem early.       .  Track and Manage My Blood Pressure   On track      Timeframe:  Long-Range Goal Priority:  Medium Start Date: 06/28/20                            Expected End Date: 06/28/21                      Follow Up Date: 05/27/21   - check blood pressure 3 times per week (to be completed during HD treatments)    Why is  this important?    You won't feel high blood pressure, but it can still hurt your blood vessels.   High blood pressure can cause heart or kidney problems. It can also cause a stroke.   Making lifestyle changes like losing a [place] weight or eating less salt will help.   Checking your blood pressure at home and at different times of the day can help to control blood pressure.   If the doctor prescribes medicine remember to take it the way the doctor ordered.   Call the office if you cannot afford the medicine or if there are questions about it.

## 2021-01-01 NOTE — Chronic Care Management (AMB) (Signed)
Chronic Care Management   CCM RN Visit Note  01/01/2021 Name: Jennifer Payne MRN: [id number] DOB: [birth year]  Subjective: Jennifer Payne is a 70 y.o. year old female who is a primary care patient of Glendale Chard, MD. The care management team was consulted for assistance with disease management and care coordination needs.    Engaged with patient by telephone for follow up visit in response to provider referral for case management and/or care coordination services.   Consent to Services:  The patient was given information about Chronic Care Management services, agreed to services, and gave verbal consent prior to initiation of services.  Please see initial visit note for detailed documentation.   Patient agreed to services and verbal consent obtained.   Assessment: Review of patient past medical history, allergies, medications, health status, including review of consultants reports, laboratory and other test data, was performed as part of comprehensive evaluation and provision of chronic care management services.   SDOH (Social Determinants of Health) assessments and interventions performed:  Yes, no acute needs  CCM Care Plan  Allergies  Allergen Reactions  . Erythromycin Rash    Outpatient Encounter Medications as of 01/01/2021  Medication Sig  . amLODipine (NORVASC) 5 MG tablet  (Patient not taking: Reported on 05/03/2020)  . atorvastatin (LIPITOR) 80 MG tablet Take 1 tablet by mouth once daily  . BD PEN NEEDLE NANO U/F 32G X 4 MM MISC USE AS DIRECTED WITH LEVEMIR PEN  . calcitRIOL (ROCALTROL) 0.5 MCG capsule Take 0.5 mcg by mouth daily.  . clobetasol ointment (TEMOVATE) 5.00 % Apply 1 application topically 2 (two) times daily. (Patient not taking: Reported on 05/03/2020)  . Continuous Blood Gluc Receiver (FREESTYLE LIBRE 14 DAY READER) DEVI Inject 1 each into the skin 4 (four) times daily. To check blood sugars dx: e11.22  . Continuous Blood Gluc Sensor (FREESTYLE LIBRE 14  DAY SENSOR) MISC Inject 1 each into the skin QID. Dx:e11.22  . EUTHYROX 88 MCG tablet Take 1 tablet by mouth once daily  . glucose blood (ONETOUCH VERIO) test strip Use as instructed to check blood sugars  3 times per day dx: e11.65  . glucose blood test strip OneTouch Verio test strips  USE 1 TEST STRIP TO CHECK BLOOD SUGAR THREE TIMES DAILY AS INSTRUCTED  . hydrALAZINE (APRESOLINE) 25 MG tablet  (Patient not taking: Reported on 05/03/2020)  . LEVEMIR FLEXTOUCH 100 UNIT/ML Pen INJECT 20 UNITS SUBCUTANEOUSLY ONCE DAILY AT BEDTIME (Patient not taking: Reported on 05/03/2020)  . metolazone (ZAROXOLYN) 2.5 MG tablet Monday, Wednesday, Friday (Patient not taking: Reported on 05/03/2020)  . metoprolol succinate (TOPROL-XL) 100 MG 24 hr tablet TAKE 1 TABLET BY MOUTH ONCE DAILY IN THE MORNING (Patient not taking: Reported on 05/03/2020)  . OneTouch Delica Lancets 93G MISC Use as instructed to check blood sugars  3 times per day dx: e11.65  . torsemide (DEMADEX) 20 MG tablet  (Patient not taking: Reported on 05/03/2020)   No facility-administered encounter medications on file as of 01/01/2021.    Patient Active Problem List   Diagnosis Date Noted  . Pain in toes of both feet 05/20/2020  . Class 1 obesity due to excess calories with serious comorbidity and body mass index (BMI) of 34.0 to 34.9 in adult 05/20/2020  . Type 2 diabetes mellitus with stage 4 chronic kidney disease, with long-term current use of insulin ([place]) 08/19/2018  . Hypertensive nephropathy 08/19/2018  . Primary hypothyroidism 08/19/2018  . Proliferative diabetic retinopathy associated with type 2  diabetes mellitus ([place]) 08/19/2018  . Encounter for general adult medical examination without abnormal findings 01/14/2018    Conditions to be addressed/monitored:Type II DM, hypertensive nephropathy, ESRD on HD  Care Plan : Diabetes Type 2 (Adult)  Updates made by Lynne Logan, RN since 01/01/2021 12:00 AM    Problem: Glycemic Management  (Diabetes, Type 2)   Priority: Medium    Long-Range Goal: Glycemic Management Optimized   Start Date: 06/28/2020  Expected End Date: 06/28/2021  Recent Progress: On track  Priority: Medium  Note:   Objective:  Lab Results  Component Value Date   HGBA1C 6.4 (H) 05/03/2020 .   Lab Results  Component Value Date   CREATININE 7.57 (H) 05/03/2020   CREATININE 8.38 (H) 02/23/2019   CREATININE 4.29 (H) 08/19/2018   Current Barriers:  Marland Kitchen Knowledge Deficits related to basic Diabetes pathophysiology and self care/management . Knowledge Deficits related to medications used for management of diabetes Case Manager Clinical Goal(s):  Marland Kitchen Patient will demonstrate improved adherence to prescribed treatment plan for diabetes self care/management as evidenced by:  . daily monitoring and recording of CBG  . adherence to ADA/ carb modified diet . adherence to prescribed medication regimen Interventions:  01/01/21 completed successful outbound call with patient  . Collaboration with Glendale Chard, MD regarding development and update of comprehensive plan of care as evidenced by provider attestation and co-signature . Inter-disciplinary care team collaboration (see longitudinal plan of care) . Provided education to patient about basic DM disease process . Review of patient status, including review of consultant's reports, relevant laboratory and other test results, and medications completed. . Educated on daily glycemic control FBS 80-130, <180 after meals; Educated on dietary and exercise recommendations  . Reviewed medications with patient and discussed importance of medication adherence . Confirmed patient received and reviewed written educational materials related to hypo and hyperglycemia and importance of correct treatment . Advised patient, providing education and rationale, to check cbg 1-2 times daily before meals and record, calling the CCM team and or PCP for findings outside established  parameters.   . Educated patient providing rationale importance of closely monitoring cbg following Cortisol injections . Determined patient is experiencing new symptoms suggestive of Diabetic Neuropathy, her Nephrologist prescribed Gabapentin, however due to concerns about potential SE, Ms. Stegmaier is declining to take this medication . Determined Ms. Mizer would like PCP f/u for further evaluation of this condition and to discuss alternative treatment options  . Noted patient will follow up with Ramandeep FNP on 01/10/21 @9 :30 AM  . Discussed plans with patient for ongoing care management follow up and provided patient with direct contact information for care management team Patient Goals/Self-Care Activities . Patient will:  Self administers insulin as prescribed Attend all scheduled provider appointments Check blood sugars as prescribed and utilize hyper and hypoglycemia protocol as needed Adhere to prescribed ADA/carb modified  Follow Up Plan: Telephone follow up appointment with care management team member scheduled for: 05/27/21   Care Plan : Hypertension (Adult)  Updates made by Lynne Logan, RN since 01/01/2021 12:00 AM    Problem: Hypertension (Hypertension)   Priority: High    Long-Range Goal: Hypertension Monitored   Start Date: 06/28/2020  Expected End Date: 06/28/2021  Priority: High  Note:   Objective:  . Last practice recorded BP readings:  BP Readings from Last 3 Encounters:  05/03/20 130/64  05/03/20 130/64  02/23/19 112/62   Current Barriers:  Marland Kitchen Knowledge Deficits related to basic understanding of hypertension pathophysiology  and self care management . Knowledge Deficits related to understanding of medications prescribed for management of hypertension Case Manager Clinical Goal(s):  Marland Kitchen Patient will verbalize understanding of plan for hypertension management Interventions:  01/01/21 completed successful outbound call with patient  . Collaboration with Glendale Chard, MD regarding development and update of comprehensive plan of care as evidenced by provider attestation and co-signature . Inter-disciplinary care team collaboration (see longitudinal plan of care) . Evaluation of current treatment plan related to hypertension self management and patient's adherence to plan as established by provider. . Review of patient status, including review of consultant's reports, relevant laboratory and other test results, and medications completed. . Reviewed medications with patient and discussed importance of compliance . Determined patient is advised her blood pressures during HD treatments and with biannual check ups performed by [facility] nurse, are within target range . Educated patient on dietary and exercise recommendations, patient is walking 3 times weekly with her rollator, she is eating home delivered meals  . Discussed plans with patient for ongoing care management follow up and provided patient with direct contact information for care management team Patient Goals/Self-Care Activities  Self administers medications as prescribed Attend all scheduled provider appointments Call provider office for new concerns, questions, or BP outside discussed parameters Check BP and records as discussed Follow a low sodium diet/DASH diet  Follow Up Plan: Telephone follow up appointment with care management team member scheduled for: 05/27/21    Plan:Telephone follow up appointment with care management team member scheduled for:  05/27/21  Barb Merino, RN, BSN, CCM Care Management Coordinator [place] [facility]  Direct Phone: [phone]

## 2021-01-02 DIAGNOSIS — E8779 Other fluid overload: Secondary | ICD-10-CM | POA: Diagnosis not present

## 2021-01-02 DIAGNOSIS — N186 End stage renal disease: Secondary | ICD-10-CM | POA: Diagnosis not present

## 2021-01-02 DIAGNOSIS — N2581 Secondary hyperparathyroidism of renal origin: Secondary | ICD-10-CM | POA: Diagnosis not present

## 2021-01-02 DIAGNOSIS — D631 Anemia in chronic kidney disease: Secondary | ICD-10-CM | POA: Diagnosis not present

## 2021-01-02 DIAGNOSIS — D509 Iron deficiency anemia, unspecified: Secondary | ICD-10-CM | POA: Diagnosis not present

## 2021-01-04 DIAGNOSIS — E8779 Other fluid overload: Secondary | ICD-10-CM | POA: Diagnosis not present

## 2021-01-04 DIAGNOSIS — D631 Anemia in chronic kidney disease: Secondary | ICD-10-CM | POA: Diagnosis not present

## 2021-01-04 DIAGNOSIS — N186 End stage renal disease: Secondary | ICD-10-CM | POA: Diagnosis not present

## 2021-01-04 DIAGNOSIS — N2581 Secondary hyperparathyroidism of renal origin: Secondary | ICD-10-CM | POA: Diagnosis not present

## 2021-01-04 DIAGNOSIS — D509 Iron deficiency anemia, unspecified: Secondary | ICD-10-CM | POA: Diagnosis not present

## 2021-01-07 DIAGNOSIS — N186 End stage renal disease: Secondary | ICD-10-CM | POA: Diagnosis not present

## 2021-01-07 DIAGNOSIS — D509 Iron deficiency anemia, unspecified: Secondary | ICD-10-CM | POA: Diagnosis not present

## 2021-01-07 DIAGNOSIS — D631 Anemia in chronic kidney disease: Secondary | ICD-10-CM | POA: Diagnosis not present

## 2021-01-07 DIAGNOSIS — E8779 Other fluid overload: Secondary | ICD-10-CM | POA: Diagnosis not present

## 2021-01-07 DIAGNOSIS — N2581 Secondary hyperparathyroidism of renal origin: Secondary | ICD-10-CM | POA: Diagnosis not present

## 2021-01-09 DIAGNOSIS — D631 Anemia in chronic kidney disease: Secondary | ICD-10-CM | POA: Diagnosis not present

## 2021-01-09 DIAGNOSIS — E8779 Other fluid overload: Secondary | ICD-10-CM | POA: Diagnosis not present

## 2021-01-09 DIAGNOSIS — N2581 Secondary hyperparathyroidism of renal origin: Secondary | ICD-10-CM | POA: Diagnosis not present

## 2021-01-09 DIAGNOSIS — N186 End stage renal disease: Secondary | ICD-10-CM | POA: Diagnosis not present

## 2021-01-09 DIAGNOSIS — D509 Iron deficiency anemia, unspecified: Secondary | ICD-10-CM | POA: Diagnosis not present

## 2021-01-10 ENCOUNTER — Other Ambulatory Visit: Payer: Self-pay

## 2021-01-10 ENCOUNTER — Ambulatory Visit: Payer: Medicare HMO | Admitting: Nurse Practitioner

## 2021-01-10 ENCOUNTER — Encounter: Payer: Self-pay | Admitting: Nurse Practitioner

## 2021-01-10 VITALS — BP 122/80 | HR 74 | Temp 98.5°F | Ht 64.4 in | Wt 232.8 lb

## 2021-01-10 DIAGNOSIS — D631 Anemia in chronic kidney disease: Secondary | ICD-10-CM | POA: Diagnosis not present

## 2021-01-10 DIAGNOSIS — D509 Iron deficiency anemia, unspecified: Secondary | ICD-10-CM | POA: Diagnosis not present

## 2021-01-10 DIAGNOSIS — Z992 Dependence on renal dialysis: Secondary | ICD-10-CM

## 2021-01-10 DIAGNOSIS — R202 Paresthesia of skin: Secondary | ICD-10-CM | POA: Diagnosis not present

## 2021-01-10 DIAGNOSIS — I129 Hypertensive chronic kidney disease with stage 1 through stage 4 chronic kidney disease, or unspecified chronic kidney disease: Secondary | ICD-10-CM

## 2021-01-10 DIAGNOSIS — E8779 Other fluid overload: Secondary | ICD-10-CM | POA: Diagnosis not present

## 2021-01-10 DIAGNOSIS — N2581 Secondary hyperparathyroidism of renal origin: Secondary | ICD-10-CM | POA: Diagnosis not present

## 2021-01-10 DIAGNOSIS — E1122 Type 2 diabetes mellitus with diabetic chronic kidney disease: Secondary | ICD-10-CM | POA: Diagnosis not present

## 2021-01-10 DIAGNOSIS — N186 End stage renal disease: Secondary | ICD-10-CM | POA: Diagnosis not present

## 2021-01-10 NOTE — Patient Instructions (Signed)
Diabetes Mellitus and Nutrition, Adult When you have diabetes, or diabetes mellitus, it is very important to have healthy eating habits because your blood sugar (glucose) levels are greatly affected by what you eat and drink. Eating healthy foods in the right amounts, at about the same times every day, can help you:  Control your blood glucose.  Lower your risk of heart disease.  Improve your blood pressure.  Reach or maintain a healthy weight. What can affect my meal plan? Every person with diabetes is different, and each person has different needs for a meal plan. Your health care provider may recommend that you work with a dietitian to make a meal plan that is best for you. Your meal plan may vary depending on factors such as:  The calories you need.  The medicines you take.  Your weight.  Your blood glucose, blood pressure, and cholesterol levels.  Your activity level.  Other health conditions you have, such as heart or kidney disease. How do carbohydrates affect me? Carbohydrates, also called carbs, affect your blood glucose level more than any other type of food. Eating carbs naturally raises the amount of glucose in your blood. Carb counting is a method for keeping track of how many carbs you eat. Counting carbs is important to keep your blood glucose at a healthy level, especially if you use insulin or take certain oral diabetes medicines. It is important to know how many carbs you can safely have in each meal. This is different for every person. Your dietitian can help you calculate how many carbs you should have at each meal and for each snack. How does alcohol affect me? Alcohol can cause a sudden decrease in blood glucose (hypoglycemia), especially if you use insulin or take certain oral diabetes medicines. Hypoglycemia can be a life-threatening condition. Symptoms of hypoglycemia, such as sleepiness, dizziness, and confusion, are similar to symptoms of having too much  alcohol.  Do not drink alcohol if: ? Your health care provider tells you not to drink. ? You are pregnant, may be pregnant, or are planning to become pregnant.  If you drink alcohol: ? Do not drink on an empty stomach. ? Limit how much you use to:  0-1 drink a day for women.  0-2 drinks a day for men. ? Be aware of how much alcohol is in your drink. In the [place] one drink equals one 12 oz bottle of beer (355 mL), one 5 oz glass of wine (148 mL), or one 1 oz glass of hard liquor (44 mL). ? Keep yourself hydrated with water, diet soda, or unsweetened iced tea.  Keep in mind that regular soda, juice, and other mixers may contain a lot of sugar and must be counted as carbs. What are tips for following this plan? Reading food labels  Start by checking the serving size on the "Nutrition Facts" label of packaged foods and drinks. The amount of calories, carbs, fats, and other nutrients listed on the label is based on one serving of the item. Many items contain more than one serving per package.  Check the total grams (g) of carbs in one serving. You can calculate the number of servings of carbs in one serving by dividing the total carbs by 15. For example, if a food has 30 g of total carbs per serving, it would be equal to 2 servings of carbs.  Check the number of grams (g) of saturated fats and trans fats in one serving. Choose foods that have   a low amount or none of these fats.  Check the number of milligrams (mg) of salt (sodium) in one serving. Most people should limit total sodium intake to less than 2,300 mg per day.  Always check the nutrition information of foods labeled as "low-fat" or "nonfat." These foods may be higher in added sugar or refined carbs and should be avoided.  Talk to your dietitian to identify your daily goals for nutrients listed on the label. Shopping  Avoid buying canned, pre-made, or processed foods. These foods tend to be high in fat, sodium, and added  sugar.  Shop around the outside edge of the grocery store. This is where you will most often find fresh fruits and vegetables, bulk grains, fresh meats, and fresh dairy. Cooking  Use low-heat cooking methods, such as baking, instead of high-heat cooking methods like deep frying.  Cook using healthy oils, such as olive, canola, or sunflower oil.  Avoid cooking with butter, cream, or high-fat meats. Meal planning  Eat meals and snacks regularly, preferably at the same times every day. Avoid going long periods of time without eating.  Eat foods that are high in fiber, such as fresh fruits, vegetables, beans, and whole grains. Talk with your dietitian about how many servings of carbs you can eat at each meal.  Eat 4-6 oz (112-168 g) of lean protein each day, such as lean meat, chicken, fish, eggs, or tofu. One ounce (oz) of lean protein is equal to: ? 1 oz (28 g) of meat, chicken, or fish. ? 1 egg. ?  cup (62 g) of tofu.  Eat some foods each day that contain healthy fats, such as avocado, nuts, seeds, and fish.   What foods should I eat? Fruits Berries. Apples. Oranges. Peaches. Apricots. Plums. Grapes. Mango. Papaya. Pomegranate. Kiwi. Cherries. Vegetables Lettuce. Spinach. Leafy greens, including kale, chard, collard greens, and mustard greens. Beets. Cauliflower. Cabbage. Broccoli. Carrots. Green beans. Tomatoes. Peppers. Onions. Cucumbers. Brussels sprouts. Grains Whole grains, such as whole-wheat or whole-grain bread, crackers, tortillas, cereal, and pasta. Unsweetened oatmeal. Quinoa. Brown or wild rice. Meats and other proteins Seafood. Poultry without skin. Lean cuts of poultry and beef. Tofu. Nuts. Seeds. Dairy Low-fat or fat-free dairy products such as milk, yogurt, and cheese. The items listed above may not be a complete list of foods and beverages you can eat. Contact a dietitian for more information. What foods should I avoid? Fruits Fruits canned with  syrup. Vegetables Canned vegetables. Frozen vegetables with butter or cream sauce. Grains Refined white flour and flour products such as bread, pasta, snack foods, and cereals. Avoid all processed foods. Meats and other proteins Fatty cuts of meat. Poultry with skin. Breaded or fried meats. Processed meat. Avoid saturated fats. Dairy Full-fat yogurt, cheese, or milk. Beverages Sweetened drinks, such as soda or iced tea. The items listed above may not be a complete list of foods and beverages you should avoid. Contact a dietitian for more information. Questions to ask a health care provider  Do I need to meet with a diabetes educator?  Do I need to meet with a dietitian?  What number can I call if I have questions?  When are the best times to check my blood glucose? Where to find more information:  American Diabetes Association: diabetes.org  Academy of Nutrition and Dietetics: www.eatright.org  National Institute of Diabetes and Digestive and Kidney Diseases: www.niddk.nih.gov  Association of Diabetes Care and Education Specialists: www.diabeteseducator.org Summary  It is important to have healthy eating   habits because your blood sugar (glucose) levels are greatly affected by what you eat and drink.  A healthy meal plan will help you control your blood glucose and maintain a healthy lifestyle.  Your health care provider may recommend that you work with a dietitian to make a meal plan that is best for you.  Keep in mind that carbohydrates (carbs) and alcohol have immediate effects on your blood glucose levels. It is important to count carbs and to use alcohol carefully. This information is not intended to replace advice given to you by your health care provider. Make sure you discuss any questions you have with your health care provider. Document Revised: 06/21/2019 Document Reviewed: 06/21/2019 Elsevier Patient Education  2021 Elsevier Inc.  

## 2021-01-10 NOTE — Progress Notes (Addendum)
I,Tianna Badgett,acting as a Education administrator for [employer], NP.,have documented all relevant documentation on the behalf of [employer], NP,as directed by  Bary Castilla, NP while in the presence of Bary Castilla, NP.  This visit occurred during the SARS-CoV-2 public health emergency.  Safety protocols were in place, including screening questions prior to the visit, additional usage of staff PPE, and extensive cleaning of exam room while observing appropriate contact time as indicated for disinfecting solutions.  Subjective:     Patient ID: Jennifer Payne , female    DOB: [birth year] , 70 y.o.   MRN: [id number]   Chief Complaint  Patient presents with   Diabetes   Hypertension    HPI  She is here for some pain in her right leg. She describes a fluttering tingling feeling in her leg and below her feet.   She gets dailysis every 3 days week. M,W,F. WE will check her magnesium level and Vitamin B12. She states she gets her labs done when she gets dialysis. She wants to be sen today for the "fluttering" pain in her legs. Denies SOB, chest pain, warmth in her legs, or numbness.     Past Medical History:  Diagnosis Date   Cataract    Diabetes mellitus    Hyperlipidemia    Hypertension    Thyroid disease      Family History  Problem Relation Age of Onset   Alzheimer's disease Mother    Coronary artery disease Mother    Hyperlipidemia Mother    Obesity Mother    Hypertension Father    Colon cancer Neg Hx      Current Outpatient Medications:    amoxicillin-clavulanate (AUGMENTIN) 500-125 MG tablet, TAKE 1 TABLET BY MOUTH BY MOUTH FOR 10 DAYS, Disp: , Rfl:    atorvastatin (LIPITOR) 80 MG tablet, Take 1 tablet by mouth once daily, Disp: 90 tablet, Rfl: 0   EUTHYROX 88 MCG tablet, Take 1 tablet by mouth once daily, Disp: 90 tablet, Rfl: 0   sevelamer carbonate (RENVELA) 800 MG tablet, TAKE 3 TABLETS BY MOUTH WITH MEALS, Disp: , Rfl:    calcium acetate (PHOSLO) 667 MG  tablet, TAKE 1 TABLET BY MOUTH ONCE DAILY WITH MEALS, Disp: , Rfl:    Allergies  Allergen Reactions   Erythromycin Rash     Review of Systems  Constitutional: Negative.  Negative for chills, fatigue and fever.  Respiratory: Negative.  Negative for cough, chest tightness, shortness of breath and wheezing.   Cardiovascular: Negative.  Negative for chest pain, palpitations and leg swelling.  Gastrointestinal:  Negative for diarrhea and nausea.  Endocrine: Negative for polydipsia, polyphagia and polyuria.  Musculoskeletal:  Negative for arthralgias and myalgias.       "Fluttery" leg sensation  Skin:  Negative for color change and rash.  Neurological: Negative.  Negative for dizziness, weakness, numbness and headaches.       Tingling sensation in her right leg   Psychiatric/Behavioral: Negative.      Today's Vitals   01/10/21 0945  BP: 122/80  Pulse: 74  Temp: 98.5 F (36.9 C)  Weight: 232 lb 12.8 oz (105.6 kg)  Height: 5' 4.4" (1.636 m)  PainSc: 0-No pain   Body mass index is 39.47 kg/m.   Objective:  Physical Exam Constitutional:      Appearance: Normal appearance. She is obese.  HENT:     Head: Normocephalic and atraumatic.  Cardiovascular:     Rate and Rhythm: Normal rate and regular rhythm.  Pulses: Normal pulses.     Heart sounds: Normal heart sounds. No murmur heard. Pulmonary:     Effort: Pulmonary effort is normal. No respiratory distress.     Breath sounds: No wheezing.  Musculoskeletal:        General: No swelling or tenderness.     Right lower leg: Normal. No swelling or tenderness. No edema.     Comments: No pain or tenderness upon palpating.   Skin:    Capillary Refill: Capillary refill takes less than 2 seconds.  Neurological:     Mental Status: She is alert and oriented to person, place, and time.        Assessment And Plan:     1. Tingling in extremities - Magnesium - Vitamin B12 - Hemoglobin A1c -will check labs -Advised patient to stay  well hydrated  -Advised patient to take OTC magnesium  -Will check magnesium level and vit B12 as well as her Hgb A1c  -Patient gets CBC and CMP at kidney center every month  2. ESRD on dialysis [facility]  -Chronic -Continue with dialysis M,W,F -Patient gets labs completed at kidney center every month -Discussed with patient the importance of glycemic control and long term complications from uncontrolled diabetes. Discussed with the patient the importance of compliance with home glucose monitoring, diet which includes decrease amount of sugary drinks and foods. Importance of exercise was also discussed with the patient. Importance of eye exams, self foot care and compliance to office visits was also discussed with the patient.   3. Hypertensive nephropathy -Chronic, stable  -Today BP was 122/80 -Limit the intake of processed foods and salt intake. You should increase your intake of green vegetables and fruits. Limit the use of alcohol. Limit fast foods and fried foods. Avoid high fatty saturated and trans fat foods. Keep yourself hydrated with drinking water. Avoid red meats. Eat lean meats instead. Exercise for atleast 30-45 min for atleast 4-5 times a week.    The patient was encouraged to call or send a message through [place] for any questions or concerns.   Follow up: if symptoms persist or do not get better.   Patient was given opportunity to ask questions. Patient verbalized understanding of the plan and was able to repeat key elements of the plan. All questions were answered to their satisfaction.  Raman Jagjit Riner, DNP   I, Raman Carney Saxton have reviewed all documentation for this visit. The documentation on 01/10/21 for the exam, diagnosis, procedures, and orders are all accurate and complete.    IF YOU HAVE BEEN REFERRED TO A SPECIALIST, IT MAY TAKE 1-2 WEEKS TO SCHEDULE/PROCESS THE REFERRAL. IF YOU HAVE NOT HEARD FROM US/SPECIALIST IN TWO WEEKS, PLEASE GIVE [place] A CALL AT [phone] X [phone].    THE PATIENT IS ENCOURAGED TO PRACTICE SOCIAL DISTANCING DUE TO THE COVID-19 PANDEMIC.

## 2021-01-11 DIAGNOSIS — D631 Anemia in chronic kidney disease: Secondary | ICD-10-CM | POA: Diagnosis not present

## 2021-01-11 DIAGNOSIS — N186 End stage renal disease: Secondary | ICD-10-CM | POA: Diagnosis not present

## 2021-01-11 DIAGNOSIS — N2581 Secondary hyperparathyroidism of renal origin: Secondary | ICD-10-CM | POA: Diagnosis not present

## 2021-01-11 DIAGNOSIS — D509 Iron deficiency anemia, unspecified: Secondary | ICD-10-CM | POA: Diagnosis not present

## 2021-01-11 DIAGNOSIS — E8779 Other fluid overload: Secondary | ICD-10-CM | POA: Diagnosis not present

## 2021-01-11 LAB — VITAMIN B12: Vitamin B-12: 1021 pg/mL (ref 232–1245)

## 2021-01-11 LAB — HEMOGLOBIN A1C
Est. average glucose Bld gHb Est-mCnc: 154 mg/dL
Hgb A1c MFr Bld: 7 % — ABNORMAL HIGH (ref 4.8–5.6)

## 2021-01-11 LAB — MAGNESIUM: Magnesium: 2.2 mg/dL (ref 1.6–2.3)

## 2021-01-14 DIAGNOSIS — N2581 Secondary hyperparathyroidism of renal origin: Secondary | ICD-10-CM | POA: Diagnosis not present

## 2021-01-14 DIAGNOSIS — D631 Anemia in chronic kidney disease: Secondary | ICD-10-CM | POA: Diagnosis not present

## 2021-01-14 DIAGNOSIS — N186 End stage renal disease: Secondary | ICD-10-CM | POA: Diagnosis not present

## 2021-01-14 DIAGNOSIS — D509 Iron deficiency anemia, unspecified: Secondary | ICD-10-CM | POA: Diagnosis not present

## 2021-01-14 DIAGNOSIS — E8779 Other fluid overload: Secondary | ICD-10-CM | POA: Diagnosis not present

## 2021-01-16 DIAGNOSIS — E8779 Other fluid overload: Secondary | ICD-10-CM | POA: Diagnosis not present

## 2021-01-16 DIAGNOSIS — D509 Iron deficiency anemia, unspecified: Secondary | ICD-10-CM | POA: Diagnosis not present

## 2021-01-16 DIAGNOSIS — N186 End stage renal disease: Secondary | ICD-10-CM | POA: Diagnosis not present

## 2021-01-16 DIAGNOSIS — D631 Anemia in chronic kidney disease: Secondary | ICD-10-CM | POA: Diagnosis not present

## 2021-01-16 DIAGNOSIS — N2581 Secondary hyperparathyroidism of renal origin: Secondary | ICD-10-CM | POA: Diagnosis not present

## 2021-01-18 DIAGNOSIS — D631 Anemia in chronic kidney disease: Secondary | ICD-10-CM | POA: Diagnosis not present

## 2021-01-18 DIAGNOSIS — N2581 Secondary hyperparathyroidism of renal origin: Secondary | ICD-10-CM | POA: Diagnosis not present

## 2021-01-18 DIAGNOSIS — E8779 Other fluid overload: Secondary | ICD-10-CM | POA: Diagnosis not present

## 2021-01-18 DIAGNOSIS — N186 End stage renal disease: Secondary | ICD-10-CM | POA: Diagnosis not present

## 2021-01-18 DIAGNOSIS — D509 Iron deficiency anemia, unspecified: Secondary | ICD-10-CM | POA: Diagnosis not present

## 2021-01-21 DIAGNOSIS — N186 End stage renal disease: Secondary | ICD-10-CM | POA: Diagnosis not present

## 2021-01-21 DIAGNOSIS — E8779 Other fluid overload: Secondary | ICD-10-CM | POA: Diagnosis not present

## 2021-01-21 DIAGNOSIS — D631 Anemia in chronic kidney disease: Secondary | ICD-10-CM | POA: Diagnosis not present

## 2021-01-21 DIAGNOSIS — D509 Iron deficiency anemia, unspecified: Secondary | ICD-10-CM | POA: Diagnosis not present

## 2021-01-21 DIAGNOSIS — N2581 Secondary hyperparathyroidism of renal origin: Secondary | ICD-10-CM | POA: Diagnosis not present

## 2021-01-23 DIAGNOSIS — D631 Anemia in chronic kidney disease: Secondary | ICD-10-CM | POA: Diagnosis not present

## 2021-01-23 DIAGNOSIS — N2581 Secondary hyperparathyroidism of renal origin: Secondary | ICD-10-CM | POA: Diagnosis not present

## 2021-01-23 DIAGNOSIS — N186 End stage renal disease: Secondary | ICD-10-CM | POA: Diagnosis not present

## 2021-01-23 DIAGNOSIS — E8779 Other fluid overload: Secondary | ICD-10-CM | POA: Diagnosis not present

## 2021-01-23 DIAGNOSIS — D509 Iron deficiency anemia, unspecified: Secondary | ICD-10-CM | POA: Diagnosis not present

## 2021-01-25 DIAGNOSIS — D509 Iron deficiency anemia, unspecified: Secondary | ICD-10-CM | POA: Diagnosis not present

## 2021-01-25 DIAGNOSIS — I10 Essential (primary) hypertension: Secondary | ICD-10-CM | POA: Diagnosis not present

## 2021-01-25 DIAGNOSIS — N2581 Secondary hyperparathyroidism of renal origin: Secondary | ICD-10-CM | POA: Diagnosis not present

## 2021-01-25 DIAGNOSIS — D631 Anemia in chronic kidney disease: Secondary | ICD-10-CM | POA: Diagnosis not present

## 2021-01-25 DIAGNOSIS — N186 End stage renal disease: Secondary | ICD-10-CM | POA: Diagnosis not present

## 2021-01-28 DIAGNOSIS — N186 End stage renal disease: Secondary | ICD-10-CM | POA: Diagnosis not present

## 2021-01-28 DIAGNOSIS — N2581 Secondary hyperparathyroidism of renal origin: Secondary | ICD-10-CM | POA: Diagnosis not present

## 2021-01-28 DIAGNOSIS — D631 Anemia in chronic kidney disease: Secondary | ICD-10-CM | POA: Diagnosis not present

## 2021-01-28 DIAGNOSIS — I10 Essential (primary) hypertension: Secondary | ICD-10-CM | POA: Diagnosis not present

## 2021-01-28 DIAGNOSIS — D509 Iron deficiency anemia, unspecified: Secondary | ICD-10-CM | POA: Diagnosis not present

## 2021-01-29 ENCOUNTER — Other Ambulatory Visit: Payer: Self-pay | Admitting: Internal Medicine

## 2021-01-29 DIAGNOSIS — Z1231 Encounter for screening mammogram for malignant neoplasm of breast: Secondary | ICD-10-CM

## 2021-01-30 DIAGNOSIS — E039 Hypothyroidism, unspecified: Secondary | ICD-10-CM | POA: Diagnosis not present

## 2021-01-30 DIAGNOSIS — E1139 Type 2 diabetes mellitus with other diabetic ophthalmic complication: Secondary | ICD-10-CM | POA: Diagnosis not present

## 2021-01-30 DIAGNOSIS — N186 End stage renal disease: Secondary | ICD-10-CM | POA: Diagnosis not present

## 2021-01-30 DIAGNOSIS — I10 Essential (primary) hypertension: Secondary | ICD-10-CM | POA: Diagnosis not present

## 2021-01-30 DIAGNOSIS — N2581 Secondary hyperparathyroidism of renal origin: Secondary | ICD-10-CM | POA: Diagnosis not present

## 2021-01-30 DIAGNOSIS — D509 Iron deficiency anemia, unspecified: Secondary | ICD-10-CM | POA: Diagnosis not present

## 2021-01-30 DIAGNOSIS — D631 Anemia in chronic kidney disease: Secondary | ICD-10-CM | POA: Diagnosis not present

## 2021-01-31 ENCOUNTER — Ambulatory Visit: Payer: Medicare HMO

## 2021-01-31 DIAGNOSIS — Z992 Dependence on renal dialysis: Secondary | ICD-10-CM

## 2021-01-31 DIAGNOSIS — E1122 Type 2 diabetes mellitus with diabetic chronic kidney disease: Secondary | ICD-10-CM

## 2021-01-31 DIAGNOSIS — I129 Hypertensive chronic kidney disease with stage 1 through stage 4 chronic kidney disease, or unspecified chronic kidney disease: Secondary | ICD-10-CM

## 2021-01-31 DIAGNOSIS — N186 End stage renal disease: Secondary | ICD-10-CM

## 2021-01-31 DIAGNOSIS — I120 Hypertensive chronic kidney disease with stage 5 chronic kidney disease or end stage renal disease: Secondary | ICD-10-CM

## 2021-01-31 NOTE — Chronic Care Management (AMB) (Signed)
  Care Management   Follow Up Note   01/31/2021 Name: Tai Syfert MRN: [id number] DOB: 12-12-1950   Referred by: Glendale Chard, MD Reason for referral : Care Coordination   SW placed a successful outbound call to the patient to assist with care coordination needs. Patient indicated she in unavailable to complete today's call due to being at an appointment.  Follow Up Plan:  Rescheduled patient call  Daneen Schick, BSW, CDP Social Worker, Certified Dementia Practitioner [place] [facility] [place] [facility] [phone]

## 2021-02-01 DIAGNOSIS — D509 Iron deficiency anemia, unspecified: Secondary | ICD-10-CM | POA: Diagnosis not present

## 2021-02-01 DIAGNOSIS — D631 Anemia in chronic kidney disease: Secondary | ICD-10-CM | POA: Diagnosis not present

## 2021-02-01 DIAGNOSIS — N2581 Secondary hyperparathyroidism of renal origin: Secondary | ICD-10-CM | POA: Diagnosis not present

## 2021-02-01 DIAGNOSIS — I10 Essential (primary) hypertension: Secondary | ICD-10-CM | POA: Diagnosis not present

## 2021-02-01 DIAGNOSIS — N186 End stage renal disease: Secondary | ICD-10-CM | POA: Diagnosis not present

## 2021-02-04 DIAGNOSIS — N2581 Secondary hyperparathyroidism of renal origin: Secondary | ICD-10-CM | POA: Diagnosis not present

## 2021-02-04 DIAGNOSIS — N186 End stage renal disease: Secondary | ICD-10-CM | POA: Diagnosis not present

## 2021-02-04 DIAGNOSIS — D509 Iron deficiency anemia, unspecified: Secondary | ICD-10-CM | POA: Diagnosis not present

## 2021-02-04 DIAGNOSIS — D631 Anemia in chronic kidney disease: Secondary | ICD-10-CM | POA: Diagnosis not present

## 2021-02-04 DIAGNOSIS — I10 Essential (primary) hypertension: Secondary | ICD-10-CM | POA: Diagnosis not present

## 2021-02-06 DIAGNOSIS — N186 End stage renal disease: Secondary | ICD-10-CM | POA: Diagnosis not present

## 2021-02-06 DIAGNOSIS — D509 Iron deficiency anemia, unspecified: Secondary | ICD-10-CM | POA: Diagnosis not present

## 2021-02-06 DIAGNOSIS — D631 Anemia in chronic kidney disease: Secondary | ICD-10-CM | POA: Diagnosis not present

## 2021-02-06 DIAGNOSIS — N2581 Secondary hyperparathyroidism of renal origin: Secondary | ICD-10-CM | POA: Diagnosis not present

## 2021-02-06 DIAGNOSIS — I10 Essential (primary) hypertension: Secondary | ICD-10-CM | POA: Diagnosis not present

## 2021-02-08 DIAGNOSIS — N186 End stage renal disease: Secondary | ICD-10-CM | POA: Diagnosis not present

## 2021-02-08 DIAGNOSIS — D509 Iron deficiency anemia, unspecified: Secondary | ICD-10-CM | POA: Diagnosis not present

## 2021-02-08 DIAGNOSIS — D631 Anemia in chronic kidney disease: Secondary | ICD-10-CM | POA: Diagnosis not present

## 2021-02-08 DIAGNOSIS — I10 Essential (primary) hypertension: Secondary | ICD-10-CM | POA: Diagnosis not present

## 2021-02-08 DIAGNOSIS — N2581 Secondary hyperparathyroidism of renal origin: Secondary | ICD-10-CM | POA: Diagnosis not present

## 2021-02-11 DIAGNOSIS — N2581 Secondary hyperparathyroidism of renal origin: Secondary | ICD-10-CM | POA: Diagnosis not present

## 2021-02-11 DIAGNOSIS — N186 End stage renal disease: Secondary | ICD-10-CM | POA: Diagnosis not present

## 2021-02-11 DIAGNOSIS — D509 Iron deficiency anemia, unspecified: Secondary | ICD-10-CM | POA: Diagnosis not present

## 2021-02-11 DIAGNOSIS — I10 Essential (primary) hypertension: Secondary | ICD-10-CM | POA: Diagnosis not present

## 2021-02-11 DIAGNOSIS — D631 Anemia in chronic kidney disease: Secondary | ICD-10-CM | POA: Diagnosis not present

## 2021-02-13 DIAGNOSIS — N186 End stage renal disease: Secondary | ICD-10-CM | POA: Diagnosis not present

## 2021-02-13 DIAGNOSIS — I10 Essential (primary) hypertension: Secondary | ICD-10-CM | POA: Diagnosis not present

## 2021-02-13 DIAGNOSIS — D509 Iron deficiency anemia, unspecified: Secondary | ICD-10-CM | POA: Diagnosis not present

## 2021-02-13 DIAGNOSIS — D631 Anemia in chronic kidney disease: Secondary | ICD-10-CM | POA: Diagnosis not present

## 2021-02-13 DIAGNOSIS — N2581 Secondary hyperparathyroidism of renal origin: Secondary | ICD-10-CM | POA: Diagnosis not present

## 2021-02-15 DIAGNOSIS — D631 Anemia in chronic kidney disease: Secondary | ICD-10-CM | POA: Diagnosis not present

## 2021-02-15 DIAGNOSIS — I10 Essential (primary) hypertension: Secondary | ICD-10-CM | POA: Diagnosis not present

## 2021-02-15 DIAGNOSIS — D509 Iron deficiency anemia, unspecified: Secondary | ICD-10-CM | POA: Diagnosis not present

## 2021-02-15 DIAGNOSIS — N2581 Secondary hyperparathyroidism of renal origin: Secondary | ICD-10-CM | POA: Diagnosis not present

## 2021-02-15 DIAGNOSIS — N186 End stage renal disease: Secondary | ICD-10-CM | POA: Diagnosis not present

## 2021-02-18 DIAGNOSIS — N186 End stage renal disease: Secondary | ICD-10-CM | POA: Diagnosis not present

## 2021-02-18 DIAGNOSIS — N2581 Secondary hyperparathyroidism of renal origin: Secondary | ICD-10-CM | POA: Diagnosis not present

## 2021-02-18 DIAGNOSIS — D631 Anemia in chronic kidney disease: Secondary | ICD-10-CM | POA: Diagnosis not present

## 2021-02-18 DIAGNOSIS — D509 Iron deficiency anemia, unspecified: Secondary | ICD-10-CM | POA: Diagnosis not present

## 2021-02-18 DIAGNOSIS — I10 Essential (primary) hypertension: Secondary | ICD-10-CM | POA: Diagnosis not present

## 2021-02-20 DIAGNOSIS — I10 Essential (primary) hypertension: Secondary | ICD-10-CM | POA: Diagnosis not present

## 2021-02-20 DIAGNOSIS — D509 Iron deficiency anemia, unspecified: Secondary | ICD-10-CM | POA: Diagnosis not present

## 2021-02-20 DIAGNOSIS — N186 End stage renal disease: Secondary | ICD-10-CM | POA: Diagnosis not present

## 2021-02-20 DIAGNOSIS — D631 Anemia in chronic kidney disease: Secondary | ICD-10-CM | POA: Diagnosis not present

## 2021-02-20 DIAGNOSIS — N2581 Secondary hyperparathyroidism of renal origin: Secondary | ICD-10-CM | POA: Diagnosis not present

## 2021-02-21 ENCOUNTER — Ambulatory Visit: Payer: BC Managed Care – PPO

## 2021-02-21 DIAGNOSIS — N186 End stage renal disease: Secondary | ICD-10-CM

## 2021-02-21 DIAGNOSIS — E1122 Type 2 diabetes mellitus with diabetic chronic kidney disease: Secondary | ICD-10-CM

## 2021-02-21 NOTE — Patient Instructions (Signed)
Visit Information   Goals Addressed             This Visit's Progress    COMPLETED: Work with SW to manage care coordination needs related to ESRD       Timeframe:  Long-Range Goal Priority:  Low Start Date:   1.13.22                          Expected End Date: 5.13.22                      Patient Goals/Self-Care Activities  patient will:   - Patient will self administer medications as prescribed Patient will attend all scheduled provider appointments Patient will call provider office for new concerns or questions Contact SW as needed         Patient verbalizes understanding of instructions provided today and agrees to view in [place].   No SW follow up planned at this time. Please contact me as needed.  Daneen Schick, BSW, CDP Social Worker, Certified Dementia Practitioner [place] [facility] [phone]

## 2021-02-21 NOTE — Chronic Care Management (AMB) (Signed)
Social Work Note  02/21/2021 Name: Jennifer Payne MRN: [id number] DOB: 1950-10-03  Jennifer Payne is a 70 y.o. year old female who is a primary care patient of Glendale Chard, MD.  The Care Management team was consulted for assistance with chronic disease management and care coordination needs.  Ms. Barrell was given information about Care Management services today including:  Care Management services include personalized support from designated clinical staff supervised by her physician, including individualized plan of care and coordination with other care providers 24/7 contact phone numbers for assistance for urgent and routine care needs. The patient may stop care management services at any time (effective at the end of the month) by phone call to the office staff.  Patient agreed to services and consent obtained.   Engaged with patient by telephone for follow up visit in response to provider referral for social work chronic care management and care coordination services.  Assessment: Review of patient past medical history, allergies, medications, and health status, including review of pertinent consultant reports was performed as part of comprehensive evaluation and provision of care management/care coordination services.   SDOH (Social Determinants of Health) assessments and interventions performed:  No    Advanced Directives Status: Not addressed in this encounter.  Care Plan  Allergies  Allergen Reactions   Erythromycin Rash    Outpatient Encounter Medications as of 02/21/2021  Medication Sig   amoxicillin-clavulanate (AUGMENTIN) 500-125 MG tablet TAKE 1 TABLET BY MOUTH BY MOUTH FOR 10 DAYS   atorvastatin (LIPITOR) 80 MG tablet Take 1 tablet by mouth once daily   calcium acetate (PHOSLO) 667 MG tablet TAKE 1 TABLET BY MOUTH ONCE DAILY WITH MEALS   EUTHYROX 88 MCG tablet Take 1 tablet by mouth once daily   sevelamer carbonate (RENVELA) 800 MG tablet TAKE 3 TABLETS  BY MOUTH WITH MEALS   No facility-administered encounter medications on file as of 02/21/2021.    Patient Active Problem List   Diagnosis Date Noted   Pain in toes of both feet 05/20/2020   Class 1 obesity due to excess calories with serious comorbidity and body mass index (BMI) of 34.0 to 34.9 in adult 05/20/2020   Type 2 diabetes mellitus with stage 4 chronic kidney disease, with long-term current use of insulin ([place]) 08/19/2018   Hypertensive nephropathy 08/19/2018   Primary hypothyroidism 08/19/2018   Proliferative diabetic retinopathy associated with type 2 diabetes mellitus ([place]) 08/19/2018   Encounter for general adult medical examination without abnormal findings 01/14/2018    Conditions to be addressed/monitored: DMII and ESRD  Care Plan : Social Work [facility] Plan of Care  Updates made by Daneen Schick since 02/21/2021 12:00 AM  Completed 02/21/2021   Problem: Disease Progression - ESRD Resolved 02/21/2021     Long-Range Goal: Care Coordination needs related to ESRD Completed 02/21/2021  Start Date: 08/09/2020  Expected End Date: 12/07/2020  Recent Progress: On track  Priority: Low  Note:   Current Barriers:  Limited social support Transportation Chronic conditions including DM II, ESRD, and Hypertensive Nephropathy putting patient at increased risk of hospitalization  Social Work Clinical Goal(s):  Over the next 120 days, patient will work with SW to address concerns related to care coordination  Interventions: 1:1 collaboration with Glendale Chard, MD regarding development and update of comprehensive plan of care as evidenced by provider attestation and co-signature Inter-disciplinary care team collaboration (see longitudinal plan of care) Successful outbound call placed to the patient to assess for care coordination needs Discussed the  patient continues to do well managing her health conditions 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 The patient will remain active with RN Care Manager      Daneen Schick, BSW, CDP Social Worker, Certified Dementia Practitioner [place] [phone]

## 2021-02-22 DIAGNOSIS — I10 Essential (primary) hypertension: Secondary | ICD-10-CM | POA: Diagnosis not present

## 2021-02-22 DIAGNOSIS — D631 Anemia in chronic kidney disease: Secondary | ICD-10-CM | POA: Diagnosis not present

## 2021-02-22 DIAGNOSIS — D509 Iron deficiency anemia, unspecified: Secondary | ICD-10-CM | POA: Diagnosis not present

## 2021-02-22 DIAGNOSIS — N186 End stage renal disease: Secondary | ICD-10-CM | POA: Diagnosis not present

## 2021-02-22 DIAGNOSIS — N2581 Secondary hyperparathyroidism of renal origin: Secondary | ICD-10-CM | POA: Diagnosis not present

## 2021-02-25 DIAGNOSIS — D509 Iron deficiency anemia, unspecified: Secondary | ICD-10-CM | POA: Diagnosis not present

## 2021-02-25 DIAGNOSIS — D631 Anemia in chronic kidney disease: Secondary | ICD-10-CM | POA: Diagnosis not present

## 2021-02-25 DIAGNOSIS — I10 Essential (primary) hypertension: Secondary | ICD-10-CM | POA: Diagnosis not present

## 2021-02-25 DIAGNOSIS — D649 Anemia, unspecified: Secondary | ICD-10-CM | POA: Diagnosis not present

## 2021-02-25 DIAGNOSIS — N186 End stage renal disease: Secondary | ICD-10-CM | POA: Diagnosis not present

## 2021-02-25 DIAGNOSIS — N2581 Secondary hyperparathyroidism of renal origin: Secondary | ICD-10-CM | POA: Diagnosis not present

## 2021-02-27 DIAGNOSIS — N2581 Secondary hyperparathyroidism of renal origin: Secondary | ICD-10-CM | POA: Diagnosis not present

## 2021-02-27 DIAGNOSIS — E039 Hypothyroidism, unspecified: Secondary | ICD-10-CM | POA: Diagnosis not present

## 2021-02-27 DIAGNOSIS — D509 Iron deficiency anemia, unspecified: Secondary | ICD-10-CM | POA: Diagnosis not present

## 2021-02-27 DIAGNOSIS — N186 End stage renal disease: Secondary | ICD-10-CM | POA: Diagnosis not present

## 2021-02-27 DIAGNOSIS — D631 Anemia in chronic kidney disease: Secondary | ICD-10-CM | POA: Diagnosis not present

## 2021-03-01 DIAGNOSIS — D509 Iron deficiency anemia, unspecified: Secondary | ICD-10-CM | POA: Diagnosis not present

## 2021-03-01 DIAGNOSIS — N2581 Secondary hyperparathyroidism of renal origin: Secondary | ICD-10-CM | POA: Diagnosis not present

## 2021-03-01 DIAGNOSIS — D631 Anemia in chronic kidney disease: Secondary | ICD-10-CM | POA: Diagnosis not present

## 2021-03-01 DIAGNOSIS — N186 End stage renal disease: Secondary | ICD-10-CM | POA: Diagnosis not present

## 2021-03-04 ENCOUNTER — Telehealth: Payer: Self-pay | Admitting: Internal Medicine

## 2021-03-04 DIAGNOSIS — D631 Anemia in chronic kidney disease: Secondary | ICD-10-CM | POA: Diagnosis not present

## 2021-03-04 DIAGNOSIS — N186 End stage renal disease: Secondary | ICD-10-CM | POA: Diagnosis not present

## 2021-03-04 DIAGNOSIS — D509 Iron deficiency anemia, unspecified: Secondary | ICD-10-CM | POA: Diagnosis not present

## 2021-03-04 DIAGNOSIS — N2581 Secondary hyperparathyroidism of renal origin: Secondary | ICD-10-CM | POA: Diagnosis not present

## 2021-03-04 NOTE — Telephone Encounter (Signed)
Left message asking patient to call me  @ [phone]  Patient has appointment 06/06/21 for AWV.  I wanted to see if patient to come in sooner  Patient can have calendar year per [facility]

## 2021-03-06 DIAGNOSIS — D631 Anemia in chronic kidney disease: Secondary | ICD-10-CM | POA: Diagnosis not present

## 2021-03-06 DIAGNOSIS — N186 End stage renal disease: Secondary | ICD-10-CM | POA: Diagnosis not present

## 2021-03-06 DIAGNOSIS — D509 Iron deficiency anemia, unspecified: Secondary | ICD-10-CM | POA: Diagnosis not present

## 2021-03-06 DIAGNOSIS — N2581 Secondary hyperparathyroidism of renal origin: Secondary | ICD-10-CM | POA: Diagnosis not present

## 2021-03-08 DIAGNOSIS — N2581 Secondary hyperparathyroidism of renal origin: Secondary | ICD-10-CM | POA: Diagnosis not present

## 2021-03-08 DIAGNOSIS — D631 Anemia in chronic kidney disease: Secondary | ICD-10-CM | POA: Diagnosis not present

## 2021-03-08 DIAGNOSIS — D509 Iron deficiency anemia, unspecified: Secondary | ICD-10-CM | POA: Diagnosis not present

## 2021-03-08 DIAGNOSIS — N186 End stage renal disease: Secondary | ICD-10-CM | POA: Diagnosis not present

## 2021-03-11 DIAGNOSIS — D509 Iron deficiency anemia, unspecified: Secondary | ICD-10-CM | POA: Diagnosis not present

## 2021-03-11 DIAGNOSIS — N186 End stage renal disease: Secondary | ICD-10-CM | POA: Diagnosis not present

## 2021-03-11 DIAGNOSIS — N2581 Secondary hyperparathyroidism of renal origin: Secondary | ICD-10-CM | POA: Diagnosis not present

## 2021-03-11 DIAGNOSIS — D631 Anemia in chronic kidney disease: Secondary | ICD-10-CM | POA: Diagnosis not present

## 2021-03-13 DIAGNOSIS — N186 End stage renal disease: Secondary | ICD-10-CM | POA: Diagnosis not present

## 2021-03-13 DIAGNOSIS — D631 Anemia in chronic kidney disease: Secondary | ICD-10-CM | POA: Diagnosis not present

## 2021-03-13 DIAGNOSIS — D509 Iron deficiency anemia, unspecified: Secondary | ICD-10-CM | POA: Diagnosis not present

## 2021-03-13 DIAGNOSIS — N2581 Secondary hyperparathyroidism of renal origin: Secondary | ICD-10-CM | POA: Diagnosis not present

## 2021-03-15 DIAGNOSIS — D509 Iron deficiency anemia, unspecified: Secondary | ICD-10-CM | POA: Diagnosis not present

## 2021-03-15 DIAGNOSIS — D631 Anemia in chronic kidney disease: Secondary | ICD-10-CM | POA: Diagnosis not present

## 2021-03-15 DIAGNOSIS — N2581 Secondary hyperparathyroidism of renal origin: Secondary | ICD-10-CM | POA: Diagnosis not present

## 2021-03-15 DIAGNOSIS — N186 End stage renal disease: Secondary | ICD-10-CM | POA: Diagnosis not present

## 2021-03-20 ENCOUNTER — Other Ambulatory Visit: Payer: Self-pay | Admitting: Internal Medicine

## 2021-03-20 DIAGNOSIS — D509 Iron deficiency anemia, unspecified: Secondary | ICD-10-CM | POA: Diagnosis not present

## 2021-03-20 DIAGNOSIS — D631 Anemia in chronic kidney disease: Secondary | ICD-10-CM | POA: Diagnosis not present

## 2021-03-20 DIAGNOSIS — N2581 Secondary hyperparathyroidism of renal origin: Secondary | ICD-10-CM | POA: Diagnosis not present

## 2021-03-20 DIAGNOSIS — N186 End stage renal disease: Secondary | ICD-10-CM | POA: Diagnosis not present

## 2021-03-22 DIAGNOSIS — D631 Anemia in chronic kidney disease: Secondary | ICD-10-CM | POA: Diagnosis not present

## 2021-03-22 DIAGNOSIS — N2581 Secondary hyperparathyroidism of renal origin: Secondary | ICD-10-CM | POA: Diagnosis not present

## 2021-03-22 DIAGNOSIS — N186 End stage renal disease: Secondary | ICD-10-CM | POA: Diagnosis not present

## 2021-03-22 DIAGNOSIS — D509 Iron deficiency anemia, unspecified: Secondary | ICD-10-CM | POA: Diagnosis not present

## 2021-03-25 DIAGNOSIS — D631 Anemia in chronic kidney disease: Secondary | ICD-10-CM | POA: Diagnosis not present

## 2021-03-25 DIAGNOSIS — N186 End stage renal disease: Secondary | ICD-10-CM | POA: Diagnosis not present

## 2021-03-25 DIAGNOSIS — D509 Iron deficiency anemia, unspecified: Secondary | ICD-10-CM | POA: Diagnosis not present

## 2021-03-25 DIAGNOSIS — N2581 Secondary hyperparathyroidism of renal origin: Secondary | ICD-10-CM | POA: Diagnosis not present

## 2021-03-27 DIAGNOSIS — N186 End stage renal disease: Secondary | ICD-10-CM | POA: Diagnosis not present

## 2021-03-27 DIAGNOSIS — N2581 Secondary hyperparathyroidism of renal origin: Secondary | ICD-10-CM | POA: Diagnosis not present

## 2021-03-27 DIAGNOSIS — D631 Anemia in chronic kidney disease: Secondary | ICD-10-CM | POA: Diagnosis not present

## 2021-03-27 DIAGNOSIS — D509 Iron deficiency anemia, unspecified: Secondary | ICD-10-CM | POA: Diagnosis not present

## 2021-03-28 DIAGNOSIS — N186 End stage renal disease: Secondary | ICD-10-CM | POA: Diagnosis not present

## 2021-03-28 DIAGNOSIS — D649 Anemia, unspecified: Secondary | ICD-10-CM | POA: Diagnosis not present

## 2021-03-28 DIAGNOSIS — I10 Essential (primary) hypertension: Secondary | ICD-10-CM | POA: Diagnosis not present

## 2021-03-29 DIAGNOSIS — N2581 Secondary hyperparathyroidism of renal origin: Secondary | ICD-10-CM | POA: Diagnosis not present

## 2021-03-29 DIAGNOSIS — N186 End stage renal disease: Secondary | ICD-10-CM | POA: Diagnosis not present

## 2021-03-29 DIAGNOSIS — D631 Anemia in chronic kidney disease: Secondary | ICD-10-CM | POA: Diagnosis not present

## 2021-04-01 DIAGNOSIS — N186 End stage renal disease: Secondary | ICD-10-CM | POA: Diagnosis not present

## 2021-04-01 DIAGNOSIS — D631 Anemia in chronic kidney disease: Secondary | ICD-10-CM | POA: Diagnosis not present

## 2021-04-01 DIAGNOSIS — N2581 Secondary hyperparathyroidism of renal origin: Secondary | ICD-10-CM | POA: Diagnosis not present

## 2021-04-03 DIAGNOSIS — E039 Hypothyroidism, unspecified: Secondary | ICD-10-CM | POA: Diagnosis not present

## 2021-04-03 DIAGNOSIS — N2581 Secondary hyperparathyroidism of renal origin: Secondary | ICD-10-CM | POA: Diagnosis not present

## 2021-04-03 DIAGNOSIS — N186 End stage renal disease: Secondary | ICD-10-CM | POA: Diagnosis not present

## 2021-04-03 DIAGNOSIS — D631 Anemia in chronic kidney disease: Secondary | ICD-10-CM | POA: Diagnosis not present

## 2021-04-05 DIAGNOSIS — D631 Anemia in chronic kidney disease: Secondary | ICD-10-CM | POA: Diagnosis not present

## 2021-04-05 DIAGNOSIS — N2581 Secondary hyperparathyroidism of renal origin: Secondary | ICD-10-CM | POA: Diagnosis not present

## 2021-04-05 DIAGNOSIS — N186 End stage renal disease: Secondary | ICD-10-CM | POA: Diagnosis not present

## 2021-04-08 DIAGNOSIS — N2581 Secondary hyperparathyroidism of renal origin: Secondary | ICD-10-CM | POA: Diagnosis not present

## 2021-04-08 DIAGNOSIS — D631 Anemia in chronic kidney disease: Secondary | ICD-10-CM | POA: Diagnosis not present

## 2021-04-08 DIAGNOSIS — N186 End stage renal disease: Secondary | ICD-10-CM | POA: Diagnosis not present

## 2021-04-10 DIAGNOSIS — N2581 Secondary hyperparathyroidism of renal origin: Secondary | ICD-10-CM | POA: Diagnosis not present

## 2021-04-10 DIAGNOSIS — D631 Anemia in chronic kidney disease: Secondary | ICD-10-CM | POA: Diagnosis not present

## 2021-04-10 DIAGNOSIS — N186 End stage renal disease: Secondary | ICD-10-CM | POA: Diagnosis not present

## 2021-04-12 DIAGNOSIS — N186 End stage renal disease: Secondary | ICD-10-CM | POA: Diagnosis not present

## 2021-04-12 DIAGNOSIS — N2581 Secondary hyperparathyroidism of renal origin: Secondary | ICD-10-CM | POA: Diagnosis not present

## 2021-04-12 DIAGNOSIS — D631 Anemia in chronic kidney disease: Secondary | ICD-10-CM | POA: Diagnosis not present

## 2021-04-15 DIAGNOSIS — N186 End stage renal disease: Secondary | ICD-10-CM | POA: Diagnosis not present

## 2021-04-15 DIAGNOSIS — N2581 Secondary hyperparathyroidism of renal origin: Secondary | ICD-10-CM | POA: Diagnosis not present

## 2021-04-15 DIAGNOSIS — D631 Anemia in chronic kidney disease: Secondary | ICD-10-CM | POA: Diagnosis not present

## 2021-04-17 DIAGNOSIS — D631 Anemia in chronic kidney disease: Secondary | ICD-10-CM | POA: Diagnosis not present

## 2021-04-17 DIAGNOSIS — N186 End stage renal disease: Secondary | ICD-10-CM | POA: Diagnosis not present

## 2021-04-17 DIAGNOSIS — N2581 Secondary hyperparathyroidism of renal origin: Secondary | ICD-10-CM | POA: Diagnosis not present

## 2021-04-19 DIAGNOSIS — D631 Anemia in chronic kidney disease: Secondary | ICD-10-CM | POA: Diagnosis not present

## 2021-04-19 DIAGNOSIS — N186 End stage renal disease: Secondary | ICD-10-CM | POA: Diagnosis not present

## 2021-04-19 DIAGNOSIS — N2581 Secondary hyperparathyroidism of renal origin: Secondary | ICD-10-CM | POA: Diagnosis not present

## 2021-04-22 DIAGNOSIS — D631 Anemia in chronic kidney disease: Secondary | ICD-10-CM | POA: Diagnosis not present

## 2021-04-22 DIAGNOSIS — N186 End stage renal disease: Secondary | ICD-10-CM | POA: Diagnosis not present

## 2021-04-22 DIAGNOSIS — N2581 Secondary hyperparathyroidism of renal origin: Secondary | ICD-10-CM | POA: Diagnosis not present

## 2021-04-24 DIAGNOSIS — N2581 Secondary hyperparathyroidism of renal origin: Secondary | ICD-10-CM | POA: Diagnosis not present

## 2021-04-24 DIAGNOSIS — D631 Anemia in chronic kidney disease: Secondary | ICD-10-CM | POA: Diagnosis not present

## 2021-04-24 DIAGNOSIS — N186 End stage renal disease: Secondary | ICD-10-CM | POA: Diagnosis not present

## 2021-04-26 DIAGNOSIS — N186 End stage renal disease: Secondary | ICD-10-CM | POA: Diagnosis not present

## 2021-04-26 DIAGNOSIS — D631 Anemia in chronic kidney disease: Secondary | ICD-10-CM | POA: Diagnosis not present

## 2021-04-26 DIAGNOSIS — N2581 Secondary hyperparathyroidism of renal origin: Secondary | ICD-10-CM | POA: Diagnosis not present

## 2021-04-27 DIAGNOSIS — I10 Essential (primary) hypertension: Secondary | ICD-10-CM | POA: Diagnosis not present

## 2021-04-27 DIAGNOSIS — N186 End stage renal disease: Secondary | ICD-10-CM | POA: Diagnosis not present

## 2021-04-27 DIAGNOSIS — D649 Anemia, unspecified: Secondary | ICD-10-CM | POA: Diagnosis not present

## 2021-04-29 DIAGNOSIS — D509 Iron deficiency anemia, unspecified: Secondary | ICD-10-CM | POA: Diagnosis not present

## 2021-04-29 DIAGNOSIS — N2581 Secondary hyperparathyroidism of renal origin: Secondary | ICD-10-CM | POA: Diagnosis not present

## 2021-04-29 DIAGNOSIS — I10 Essential (primary) hypertension: Secondary | ICD-10-CM | POA: Diagnosis not present

## 2021-04-29 DIAGNOSIS — N186 End stage renal disease: Secondary | ICD-10-CM | POA: Diagnosis not present

## 2021-04-29 DIAGNOSIS — Z23 Encounter for immunization: Secondary | ICD-10-CM | POA: Diagnosis not present

## 2021-04-29 DIAGNOSIS — D631 Anemia in chronic kidney disease: Secondary | ICD-10-CM | POA: Diagnosis not present

## 2021-05-01 DIAGNOSIS — D509 Iron deficiency anemia, unspecified: Secondary | ICD-10-CM | POA: Diagnosis not present

## 2021-05-01 DIAGNOSIS — N186 End stage renal disease: Secondary | ICD-10-CM | POA: Diagnosis not present

## 2021-05-01 DIAGNOSIS — E039 Hypothyroidism, unspecified: Secondary | ICD-10-CM | POA: Diagnosis not present

## 2021-05-01 DIAGNOSIS — Z23 Encounter for immunization: Secondary | ICD-10-CM | POA: Diagnosis not present

## 2021-05-01 DIAGNOSIS — I10 Essential (primary) hypertension: Secondary | ICD-10-CM | POA: Diagnosis not present

## 2021-05-01 DIAGNOSIS — N2581 Secondary hyperparathyroidism of renal origin: Secondary | ICD-10-CM | POA: Diagnosis not present

## 2021-05-01 DIAGNOSIS — E119 Type 2 diabetes mellitus without complications: Secondary | ICD-10-CM | POA: Diagnosis not present

## 2021-05-01 DIAGNOSIS — D631 Anemia in chronic kidney disease: Secondary | ICD-10-CM | POA: Diagnosis not present

## 2021-05-03 DIAGNOSIS — Z23 Encounter for immunization: Secondary | ICD-10-CM | POA: Diagnosis not present

## 2021-05-03 DIAGNOSIS — N186 End stage renal disease: Secondary | ICD-10-CM | POA: Diagnosis not present

## 2021-05-03 DIAGNOSIS — D631 Anemia in chronic kidney disease: Secondary | ICD-10-CM | POA: Diagnosis not present

## 2021-05-03 DIAGNOSIS — N2581 Secondary hyperparathyroidism of renal origin: Secondary | ICD-10-CM | POA: Diagnosis not present

## 2021-05-03 DIAGNOSIS — D509 Iron deficiency anemia, unspecified: Secondary | ICD-10-CM | POA: Diagnosis not present

## 2021-05-03 DIAGNOSIS — I10 Essential (primary) hypertension: Secondary | ICD-10-CM | POA: Diagnosis not present

## 2021-05-06 DIAGNOSIS — N186 End stage renal disease: Secondary | ICD-10-CM | POA: Diagnosis not present

## 2021-05-06 DIAGNOSIS — D509 Iron deficiency anemia, unspecified: Secondary | ICD-10-CM | POA: Diagnosis not present

## 2021-05-06 DIAGNOSIS — Z23 Encounter for immunization: Secondary | ICD-10-CM | POA: Diagnosis not present

## 2021-05-06 DIAGNOSIS — N2581 Secondary hyperparathyroidism of renal origin: Secondary | ICD-10-CM | POA: Diagnosis not present

## 2021-05-06 DIAGNOSIS — D631 Anemia in chronic kidney disease: Secondary | ICD-10-CM | POA: Diagnosis not present

## 2021-05-06 DIAGNOSIS — I10 Essential (primary) hypertension: Secondary | ICD-10-CM | POA: Diagnosis not present

## 2021-05-08 DIAGNOSIS — I10 Essential (primary) hypertension: Secondary | ICD-10-CM | POA: Diagnosis not present

## 2021-05-08 DIAGNOSIS — D509 Iron deficiency anemia, unspecified: Secondary | ICD-10-CM | POA: Diagnosis not present

## 2021-05-08 DIAGNOSIS — D631 Anemia in chronic kidney disease: Secondary | ICD-10-CM | POA: Diagnosis not present

## 2021-05-08 DIAGNOSIS — N2581 Secondary hyperparathyroidism of renal origin: Secondary | ICD-10-CM | POA: Diagnosis not present

## 2021-05-08 DIAGNOSIS — N186 End stage renal disease: Secondary | ICD-10-CM | POA: Diagnosis not present

## 2021-05-08 DIAGNOSIS — Z23 Encounter for immunization: Secondary | ICD-10-CM | POA: Diagnosis not present

## 2021-05-10 DIAGNOSIS — D631 Anemia in chronic kidney disease: Secondary | ICD-10-CM | POA: Diagnosis not present

## 2021-05-10 DIAGNOSIS — D509 Iron deficiency anemia, unspecified: Secondary | ICD-10-CM | POA: Diagnosis not present

## 2021-05-10 DIAGNOSIS — N186 End stage renal disease: Secondary | ICD-10-CM | POA: Diagnosis not present

## 2021-05-10 DIAGNOSIS — Z23 Encounter for immunization: Secondary | ICD-10-CM | POA: Diagnosis not present

## 2021-05-10 DIAGNOSIS — N2581 Secondary hyperparathyroidism of renal origin: Secondary | ICD-10-CM | POA: Diagnosis not present

## 2021-05-10 DIAGNOSIS — I10 Essential (primary) hypertension: Secondary | ICD-10-CM | POA: Diagnosis not present

## 2021-05-13 DIAGNOSIS — I10 Essential (primary) hypertension: Secondary | ICD-10-CM | POA: Diagnosis not present

## 2021-05-13 DIAGNOSIS — D631 Anemia in chronic kidney disease: Secondary | ICD-10-CM | POA: Diagnosis not present

## 2021-05-13 DIAGNOSIS — D509 Iron deficiency anemia, unspecified: Secondary | ICD-10-CM | POA: Diagnosis not present

## 2021-05-13 DIAGNOSIS — N2581 Secondary hyperparathyroidism of renal origin: Secondary | ICD-10-CM | POA: Diagnosis not present

## 2021-05-13 DIAGNOSIS — Z23 Encounter for immunization: Secondary | ICD-10-CM | POA: Diagnosis not present

## 2021-05-13 DIAGNOSIS — N186 End stage renal disease: Secondary | ICD-10-CM | POA: Diagnosis not present

## 2021-05-15 DIAGNOSIS — I10 Essential (primary) hypertension: Secondary | ICD-10-CM | POA: Diagnosis not present

## 2021-05-15 DIAGNOSIS — D509 Iron deficiency anemia, unspecified: Secondary | ICD-10-CM | POA: Diagnosis not present

## 2021-05-15 DIAGNOSIS — Z23 Encounter for immunization: Secondary | ICD-10-CM | POA: Diagnosis not present

## 2021-05-15 DIAGNOSIS — N186 End stage renal disease: Secondary | ICD-10-CM | POA: Diagnosis not present

## 2021-05-15 DIAGNOSIS — D631 Anemia in chronic kidney disease: Secondary | ICD-10-CM | POA: Diagnosis not present

## 2021-05-15 DIAGNOSIS — N2581 Secondary hyperparathyroidism of renal origin: Secondary | ICD-10-CM | POA: Diagnosis not present

## 2021-05-17 DIAGNOSIS — Z23 Encounter for immunization: Secondary | ICD-10-CM | POA: Diagnosis not present

## 2021-05-17 DIAGNOSIS — I10 Essential (primary) hypertension: Secondary | ICD-10-CM | POA: Diagnosis not present

## 2021-05-17 DIAGNOSIS — D631 Anemia in chronic kidney disease: Secondary | ICD-10-CM | POA: Diagnosis not present

## 2021-05-17 DIAGNOSIS — D509 Iron deficiency anemia, unspecified: Secondary | ICD-10-CM | POA: Diagnosis not present

## 2021-05-17 DIAGNOSIS — N186 End stage renal disease: Secondary | ICD-10-CM | POA: Diagnosis not present

## 2021-05-17 DIAGNOSIS — N2581 Secondary hyperparathyroidism of renal origin: Secondary | ICD-10-CM | POA: Diagnosis not present

## 2021-05-20 DIAGNOSIS — N186 End stage renal disease: Secondary | ICD-10-CM | POA: Diagnosis not present

## 2021-05-20 DIAGNOSIS — D509 Iron deficiency anemia, unspecified: Secondary | ICD-10-CM | POA: Diagnosis not present

## 2021-05-20 DIAGNOSIS — N2581 Secondary hyperparathyroidism of renal origin: Secondary | ICD-10-CM | POA: Diagnosis not present

## 2021-05-20 DIAGNOSIS — Z23 Encounter for immunization: Secondary | ICD-10-CM | POA: Diagnosis not present

## 2021-05-20 DIAGNOSIS — I10 Essential (primary) hypertension: Secondary | ICD-10-CM | POA: Diagnosis not present

## 2021-05-20 DIAGNOSIS — D631 Anemia in chronic kidney disease: Secondary | ICD-10-CM | POA: Diagnosis not present

## 2021-05-22 DIAGNOSIS — D509 Iron deficiency anemia, unspecified: Secondary | ICD-10-CM | POA: Diagnosis not present

## 2021-05-22 DIAGNOSIS — I10 Essential (primary) hypertension: Secondary | ICD-10-CM | POA: Diagnosis not present

## 2021-05-22 DIAGNOSIS — N2581 Secondary hyperparathyroidism of renal origin: Secondary | ICD-10-CM | POA: Diagnosis not present

## 2021-05-22 DIAGNOSIS — D631 Anemia in chronic kidney disease: Secondary | ICD-10-CM | POA: Diagnosis not present

## 2021-05-22 DIAGNOSIS — N186 End stage renal disease: Secondary | ICD-10-CM | POA: Diagnosis not present

## 2021-05-22 DIAGNOSIS — Z23 Encounter for immunization: Secondary | ICD-10-CM | POA: Diagnosis not present

## 2021-05-23 ENCOUNTER — Ambulatory Visit: Payer: BC Managed Care – PPO

## 2021-05-23 ENCOUNTER — Ambulatory Visit: Payer: Medicare HMO

## 2021-05-23 ENCOUNTER — Encounter: Payer: Medicare HMO | Admitting: Internal Medicine

## 2021-05-24 DIAGNOSIS — D509 Iron deficiency anemia, unspecified: Secondary | ICD-10-CM | POA: Diagnosis not present

## 2021-05-24 DIAGNOSIS — N2581 Secondary hyperparathyroidism of renal origin: Secondary | ICD-10-CM | POA: Diagnosis not present

## 2021-05-24 DIAGNOSIS — N186 End stage renal disease: Secondary | ICD-10-CM | POA: Diagnosis not present

## 2021-05-24 DIAGNOSIS — I10 Essential (primary) hypertension: Secondary | ICD-10-CM | POA: Diagnosis not present

## 2021-05-24 DIAGNOSIS — D631 Anemia in chronic kidney disease: Secondary | ICD-10-CM | POA: Diagnosis not present

## 2021-05-24 DIAGNOSIS — Z23 Encounter for immunization: Secondary | ICD-10-CM | POA: Diagnosis not present

## 2021-05-27 ENCOUNTER — Telehealth: Payer: Medicare HMO

## 2021-05-27 DIAGNOSIS — D631 Anemia in chronic kidney disease: Secondary | ICD-10-CM | POA: Diagnosis not present

## 2021-05-27 DIAGNOSIS — N186 End stage renal disease: Secondary | ICD-10-CM | POA: Diagnosis not present

## 2021-05-27 DIAGNOSIS — Z23 Encounter for immunization: Secondary | ICD-10-CM | POA: Diagnosis not present

## 2021-05-27 DIAGNOSIS — D509 Iron deficiency anemia, unspecified: Secondary | ICD-10-CM | POA: Diagnosis not present

## 2021-05-27 DIAGNOSIS — N2581 Secondary hyperparathyroidism of renal origin: Secondary | ICD-10-CM | POA: Diagnosis not present

## 2021-05-27 DIAGNOSIS — I10 Essential (primary) hypertension: Secondary | ICD-10-CM | POA: Diagnosis not present

## 2021-05-28 DIAGNOSIS — N186 End stage renal disease: Secondary | ICD-10-CM | POA: Diagnosis not present

## 2021-05-28 DIAGNOSIS — H26491 Other secondary cataract, right eye: Secondary | ICD-10-CM | POA: Diagnosis not present

## 2021-05-28 DIAGNOSIS — H35371 Puckering of macula, right eye: Secondary | ICD-10-CM | POA: Diagnosis not present

## 2021-05-28 DIAGNOSIS — H5201 Hypermetropia, right eye: Secondary | ICD-10-CM | POA: Diagnosis not present

## 2021-05-28 DIAGNOSIS — I10 Essential (primary) hypertension: Secondary | ICD-10-CM | POA: Diagnosis not present

## 2021-05-28 DIAGNOSIS — H52223 Regular astigmatism, bilateral: Secondary | ICD-10-CM | POA: Diagnosis not present

## 2021-05-28 DIAGNOSIS — E083553 Diabetes mellitus due to underlying condition with stable proliferative diabetic retinopathy, bilateral: Secondary | ICD-10-CM | POA: Diagnosis not present

## 2021-05-28 DIAGNOSIS — E113592 Type 2 diabetes mellitus with proliferative diabetic retinopathy without macular edema, left eye: Secondary | ICD-10-CM | POA: Diagnosis not present

## 2021-05-28 DIAGNOSIS — E113511 Type 2 diabetes mellitus with proliferative diabetic retinopathy with macular edema, right eye: Secondary | ICD-10-CM | POA: Diagnosis not present

## 2021-05-28 DIAGNOSIS — D631 Anemia in chronic kidney disease: Secondary | ICD-10-CM | POA: Diagnosis not present

## 2021-05-28 DIAGNOSIS — H524 Presbyopia: Secondary | ICD-10-CM | POA: Diagnosis not present

## 2021-05-28 LAB — HM DIABETES EYE EXAM

## 2021-05-29 ENCOUNTER — Telehealth: Payer: Self-pay | Admitting: Internal Medicine

## 2021-05-29 DIAGNOSIS — N2581 Secondary hyperparathyroidism of renal origin: Secondary | ICD-10-CM | POA: Diagnosis not present

## 2021-05-29 DIAGNOSIS — N186 End stage renal disease: Secondary | ICD-10-CM | POA: Diagnosis not present

## 2021-05-29 DIAGNOSIS — D509 Iron deficiency anemia, unspecified: Secondary | ICD-10-CM | POA: Diagnosis not present

## 2021-05-29 DIAGNOSIS — E039 Hypothyroidism, unspecified: Secondary | ICD-10-CM | POA: Diagnosis not present

## 2021-05-29 DIAGNOSIS — D631 Anemia in chronic kidney disease: Secondary | ICD-10-CM | POA: Diagnosis not present

## 2021-05-29 NOTE — Telephone Encounter (Signed)
Left message for patient to call back and schedule Medicare Annual Wellness Visit (AWV) either virtually or in office.  Left both my jabber number [phone] and office number    Last AWV ;05/03/20 please schedule at anytime with [facility]    This should be a 45 minute visit.

## 2021-05-30 ENCOUNTER — Ambulatory Visit (INDEPENDENT_AMBULATORY_CARE_PROVIDER_SITE_OTHER): Payer: BC Managed Care – PPO

## 2021-05-30 ENCOUNTER — Telehealth: Payer: BC Managed Care – PPO

## 2021-05-30 ENCOUNTER — Encounter: Payer: Self-pay | Admitting: Internal Medicine

## 2021-05-30 DIAGNOSIS — N186 End stage renal disease: Secondary | ICD-10-CM

## 2021-05-30 DIAGNOSIS — E1122 Type 2 diabetes mellitus with diabetic chronic kidney disease: Secondary | ICD-10-CM

## 2021-05-30 DIAGNOSIS — I129 Hypertensive chronic kidney disease with stage 1 through stage 4 chronic kidney disease, or unspecified chronic kidney disease: Secondary | ICD-10-CM

## 2021-05-30 DIAGNOSIS — Z992 Dependence on renal dialysis: Secondary | ICD-10-CM

## 2021-05-31 DIAGNOSIS — N186 End stage renal disease: Secondary | ICD-10-CM | POA: Diagnosis not present

## 2021-05-31 DIAGNOSIS — D631 Anemia in chronic kidney disease: Secondary | ICD-10-CM | POA: Diagnosis not present

## 2021-05-31 DIAGNOSIS — N2581 Secondary hyperparathyroidism of renal origin: Secondary | ICD-10-CM | POA: Diagnosis not present

## 2021-05-31 DIAGNOSIS — D509 Iron deficiency anemia, unspecified: Secondary | ICD-10-CM | POA: Diagnosis not present

## 2021-05-31 NOTE — Patient Instructions (Signed)
Visit Information   PATIENT GOALS/PLAN OF CARE:   Care Plan : RN Care Manager Plan of Care  Updates made by Lynne Logan, RN since 05/30/2021 12:00 AM     Problem: No plan of care established for mangement of chronic care states (ESRD on dialysis, 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, Hypertensive nephropathy)   Priority: High     Long-Range Goal: Development of plan of care for chronic disease management for ESRD on dialysis, 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, Hypertensive nephropathy   Start Date: 05/31/2021  Expected End Date: 04/30/2022  This Visit's Progress: On track  Priority: High  Note:   Current Barriers:  Knowledge Deficits related to plan of care for management of ESRD on dialysis, 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, Hypertensive nephropathy Chronic Disease Management support and education needs related to ESRD on dialysis, 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, Hypertensive nephropathy  RNCM Clinical Goal(s):  Patient will verbalize basic understanding of  ESRD on dialysis, 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, Hypertensive nephropathy disease process and self health management plan   attend all scheduled medical appointments: patient will schedule AWV with PCP and DM follow up visit with PCP provider  demonstrate Improved health management independence   continue to work with RN Care Manager to address care management and care coordination needs related to  ESRD on dialysis, 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   Interventions: 1:1 collaboration with primary care provider regarding development and update of comprehensive plan of care as evidenced by provider attestation and co-signature Inter-disciplinary care team collaboration (see  longitudinal plan of care) Evaluation of current treatment plan related to  self management and patient's adherence to plan as established by provider  Hypertension Interventions: Last practice recorded BP readings:  BP Readings from Last 3 Encounters:  01/10/21 122/80  05/03/20 130/64  05/03/20 130/64  Most recent eGFR/CrCl: No results found for: EGFR  No components found for: CRCL  Evaluation of current treatment plan related to hypertension self management and patient's adherence to plan as established by provider; Provided education to patient re: stroke prevention, s/s of heart attack and stroke; Counseled on the importance of exercise goals with target of 150 minutes per week Discussed plans with patient for ongoing care management follow up and provided patient with direct contact information for care management team; Advised patient, providing education and rationale, to monitor blood pressure daily and record, calling PCP for findings outside established parameters;  Provided education on prescribed diet renal diet;  Discussed complications of poorly controlled blood pressure such as heart disease, stroke, circulatory complications, vision complications, kidney impairment, sexual dysfunction;  Assessed social determinant of health barriers;   Diabetes Interventions: Assessed patient's understanding of A1c goal: <7% Determined patient discontinued her insulin and stopped self monitoring her cbg's x 1 year since lowering A1c in 10/21 to 6.4 Provided education to patient about basic DM disease process; Reviewed medications with patient and discussed importance of medication adherence; Counseled on importance of regular laboratory monitoring as prescribed; Discussed plans with patient for ongoing care management follow up and provided patient with direct contact information for care management team; Provided patient with written educational materials related to hypo and hyperglycemia  and importance of correct treatment; Advised patient,  providing education and rationale, to check cbg daily before meals and at bedtime and record, calling PCP and or RN CM for findings outside established parameters; Review of patient status, including review of consultants reports, relevant laboratory and other test results, and medications completed; Assessed social determinant of health barriers;  Collaborated with Dr. Baird Cancer regarding patients reported discontinuation of insulin and self monitoring cbg's Instructed patient to schedule a DM follow up with PCP provider per Dr. Baird Cancer in order to have DM evaluation and recommendations for treatment Educated patient on dietary and exercise recommendation; Educated patient on potential complications that may occur if Diabetes is left untreated Mailed printed educational materials to patient related to Meal Planning, Diabetes management  Lab Results  Component Value Date   HGBA1C 7.0 (H) 01/10/2021  Patient Goals/Self-Care Activities: Patient will self administer medications as prescribed Patient will attend all scheduled provider appointments Patient will call pharmacy for medication refills Patient will attend church or other social activities Patient will continue to perform ADL's independently Patient will continue to perform IADL's independently Patient will call provider office for new concerns or questions  Follow Up Plan:  Telephone follow up appointment with care management team member scheduled for:  06/11/21      Consent to CCM Services: Ms. Jansson was given information about Chronic Care Management services including:  CCM service includes personalized support from designated clinical staff supervised by her physician, including individualized plan of care and coordination with other care providers 24/7 contact phone numbers for assistance for urgent and routine care needs. Service will only be billed when office clinical  staff spend 20 minutes or more in a month to coordinate care. Only one practitioner may furnish and bill the service in a calendar month. The patient may stop CCM services at any time (effective at the end of the month) by phone call to the office staff. The patient will be responsible for cost sharing (co-pay) of up to 20% of the service fee (after annual deductible is met).  Patient agreed to services and verbal consent obtained.   The patient verbalized understanding of instructions, educational materials, and care plan provided today and declined offer to receive copy of patient instructions, educational materials, and care plan.   Telephone follow up appointment with care management team member scheduled for: 06/11/21  Barb Merino, RN, BSN, CCM Care Management Coordinator [place] [facility]  Direct Phone: [phone]

## 2021-05-31 NOTE — Chronic Care Management (AMB) (Signed)
Chronic Care Management   CCM RN Visit Note  05/30/2021 Name: Jennifer Payne MRN: [id number] DOB: Dec 25, 1950  Subjective: Jennifer Payne is a 70 y.o. year old female who is a primary care patient of Glendale Chard, MD. The care management team was consulted for assistance with disease management and care coordination needs.    Engaged with patient by telephone for follow up visit in response to provider referral for case management and/or care coordination services.   Consent to Services:  The patient was given information about Chronic Care Management services, agreed to services, and gave verbal consent prior to initiation of services.  Please see initial visit note for detailed documentation.   Patient agreed to services and verbal consent obtained.   Assessment: Review of patient past medical history, allergies, medications, health status, including review of consultants reports, laboratory and other test data, was performed as part of comprehensive evaluation and provision of chronic care management services.   SDOH (Social Determinants of Health) assessments and interventions performed:  yes, no acute challenges   CCM Care Plan  Allergies  Allergen Reactions   Erythromycin Rash    Outpatient Encounter Medications as of 05/30/2021  Medication Sig   amoxicillin-clavulanate (AUGMENTIN) 500-125 MG tablet TAKE 1 TABLET BY MOUTH BY MOUTH FOR 10 DAYS   atorvastatin (LIPITOR) 80 MG tablet Take 1 tablet by mouth once daily   calcium acetate (PHOSLO) 667 MG tablet TAKE 1 TABLET BY MOUTH ONCE DAILY WITH MEALS   EUTHYROX 88 MCG tablet Take 1 tablet by mouth once daily   sevelamer carbonate (RENVELA) 800 MG tablet TAKE 3 TABLETS BY MOUTH WITH MEALS   No facility-administered encounter medications on file as of 05/30/2021.    Patient Active Problem List   Diagnosis Date Noted   Pain in toes of both feet 05/20/2020   Class 1 obesity due to excess calories with serious  comorbidity and body mass index (BMI) of 34.0 to 34.9 in adult 05/20/2020   Type 2 diabetes mellitus with stage 4 chronic kidney disease, with long-term current use of insulin ([place]) 08/19/2018   Hypertensive nephropathy 08/19/2018   Primary hypothyroidism 08/19/2018   Proliferative diabetic retinopathy associated with type 2 diabetes mellitus ([place]) 08/19/2018   Encounter for general adult medical examination without abnormal findings 01/14/2018    Conditions to be addressed/monitored: ESRD on dialysis, 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, Hypertensive nephropathy  Care Plan : RN Care Manager Plan of Care  Updates made by Lynne Logan, RN since 05/30/2021 12:00 AM     Problem: No plan of care established for mangement of chronic care states (ESRD on dialysis, 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, Hypertensive nephropathy)   Priority: High     Long-Range Goal: Development of plan of care for chronic disease management for ESRD on dialysis, 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, Hypertensive nephropathy   Start Date: 05/31/2021  Expected End Date: 04/30/2022  This Visit's Progress: On track  Priority: High  Note:   Current Barriers:  Knowledge Deficits related to plan of care for management of ESRD on dialysis, 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, Hypertensive nephropathy Chronic Disease Management support and education needs related to ESRD on dialysis, 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, Hypertensive nephropathy  RNCM Clinical Goal(s):  Patient will verbalize basic understanding of  ESRD on dialysis, 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, Hypertensive nephropathy  disease process and self health management plan   attend all scheduled medical appointments: patient will schedule AWV with PCP and DM follow up visit with PCP provider  demonstrate Improved health management independence   continue to work with RN Care  Manager to address care management and care coordination needs related to  ESRD on dialysis, 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   Interventions: 1:1 collaboration with primary care provider regarding development and update of comprehensive plan of care as evidenced by provider attestation and co-signature Inter-disciplinary care team collaboration (see longitudinal plan of care) Evaluation of current treatment plan related to  self management and patient's adherence to plan as established by provider  Hypertension Interventions: Last practice recorded BP readings:  BP Readings from Last 3 Encounters:  01/10/21 122/80  05/03/20 130/64  05/03/20 130/64  Most recent eGFR/CrCl: No results found for: EGFR  No components found for: CRCL  Evaluation of current treatment plan related to hypertension self management and patient's adherence to plan as established by provider; Provided education to patient re: stroke prevention, s/s of heart attack and stroke; Counseled on the importance of exercise goals with target of 150 minutes per week Discussed plans with patient for ongoing care management follow up and provided patient with direct contact information for care management team; Advised patient, providing education and rationale, to monitor blood pressure daily and record, calling PCP for findings outside established parameters;  Provided education on prescribed diet renal diet;  Discussed complications of poorly controlled blood pressure such as heart disease, stroke, circulatory complications, vision complications, kidney impairment, sexual dysfunction;  Assessed social determinant of health barriers;   Diabetes Interventions: Assessed patient's understanding of A1c goal: <7% Determined patient discontinued her insulin and  stopped self monitoring her cbg's x 1 year since lowering A1c in 10/21 to 6.4 Provided education to patient about basic DM disease process; Reviewed medications with patient and discussed importance of medication adherence; Counseled on importance of regular laboratory monitoring as prescribed; Discussed plans with patient for ongoing care management follow up and provided patient with direct contact information for care management team; Provided patient with written educational materials related to hypo and hyperglycemia and importance of correct treatment; Advised patient, providing education and rationale, to check cbg daily before meals and at bedtime and record, calling PCP and or RN CM for findings outside established parameters; Review of patient status, including review of consultants reports, relevant laboratory and other test results, and medications completed; Assessed social determinant of health barriers;  Collaborated with Dr. Baird Cancer regarding patients reported discontinuation of insulin and self monitoring cbg's Instructed patient to schedule a DM follow up with PCP provider per Dr. Baird Cancer in order to have DM evaluation and recommendations for treatment Educated patient on dietary and exercise recommendation; Educated patient on potential complications that may occur if Diabetes is left untreated Mailed printed educational materials to patient related to Meal Planning, Diabetes management  Lab Results  Component Value Date   HGBA1C 7.0 (H) 01/10/2021  Patient Goals/Self-Care Activities: Patient will self administer medications as prescribed Patient will attend all scheduled provider appointments Patient will call pharmacy for medication refills Patient will attend church or other social activities Patient will continue to perform ADL's independently Patient will continue to perform IADL's independently Patient will call provider office for new concerns or questions  Follow  Up Plan:  Telephone follow up appointment with care management team member scheduled for:  06/11/21     Plan:Telephone follow up appointment with care management team member scheduled for:  06/11/21  Barb Merino, RN, BSN, CCM Care Management Coordinator [place] [facility]  Direct Phone: [phone]

## 2021-06-03 DIAGNOSIS — D509 Iron deficiency anemia, unspecified: Secondary | ICD-10-CM | POA: Diagnosis not present

## 2021-06-03 DIAGNOSIS — D631 Anemia in chronic kidney disease: Secondary | ICD-10-CM | POA: Diagnosis not present

## 2021-06-03 DIAGNOSIS — N2581 Secondary hyperparathyroidism of renal origin: Secondary | ICD-10-CM | POA: Diagnosis not present

## 2021-06-03 DIAGNOSIS — N186 End stage renal disease: Secondary | ICD-10-CM | POA: Diagnosis not present

## 2021-06-04 ENCOUNTER — Ambulatory Visit: Payer: Medicare HMO

## 2021-06-05 DIAGNOSIS — N186 End stage renal disease: Secondary | ICD-10-CM | POA: Diagnosis not present

## 2021-06-05 DIAGNOSIS — D509 Iron deficiency anemia, unspecified: Secondary | ICD-10-CM | POA: Diagnosis not present

## 2021-06-05 DIAGNOSIS — D631 Anemia in chronic kidney disease: Secondary | ICD-10-CM | POA: Diagnosis not present

## 2021-06-05 DIAGNOSIS — N2581 Secondary hyperparathyroidism of renal origin: Secondary | ICD-10-CM | POA: Diagnosis not present

## 2021-06-06 ENCOUNTER — Ambulatory Visit: Payer: Medicare HMO

## 2021-06-06 ENCOUNTER — Ambulatory Visit
Admission: RE | Admit: 2021-06-06 | Discharge: 2021-06-06 | Disposition: A | Discharge status: Home / Self Care | Payer: Medicare HMO | Source: Ambulatory Visit | Attending: Internal Medicine | Admitting: Internal Medicine

## 2021-06-06 ENCOUNTER — Other Ambulatory Visit: Payer: Self-pay

## 2021-06-06 DIAGNOSIS — Z1231 Encounter for screening mammogram for malignant neoplasm of breast: Secondary | ICD-10-CM | POA: Diagnosis not present

## 2021-06-07 DIAGNOSIS — D509 Iron deficiency anemia, unspecified: Secondary | ICD-10-CM | POA: Diagnosis not present

## 2021-06-07 DIAGNOSIS — N2581 Secondary hyperparathyroidism of renal origin: Secondary | ICD-10-CM | POA: Diagnosis not present

## 2021-06-07 DIAGNOSIS — N186 End stage renal disease: Secondary | ICD-10-CM | POA: Diagnosis not present

## 2021-06-07 DIAGNOSIS — D631 Anemia in chronic kidney disease: Secondary | ICD-10-CM | POA: Diagnosis not present

## 2021-06-10 DIAGNOSIS — D509 Iron deficiency anemia, unspecified: Secondary | ICD-10-CM | POA: Diagnosis not present

## 2021-06-10 DIAGNOSIS — D631 Anemia in chronic kidney disease: Secondary | ICD-10-CM | POA: Diagnosis not present

## 2021-06-10 DIAGNOSIS — N186 End stage renal disease: Secondary | ICD-10-CM | POA: Diagnosis not present

## 2021-06-10 DIAGNOSIS — N2581 Secondary hyperparathyroidism of renal origin: Secondary | ICD-10-CM | POA: Diagnosis not present

## 2021-06-11 ENCOUNTER — Telehealth: Payer: Self-pay

## 2021-06-11 ENCOUNTER — Telehealth: Payer: BC Managed Care – PPO

## 2021-06-11 NOTE — Telephone Encounter (Signed)
  Care Management   Follow Up Note   06/11/2021 Name: Jennifer Payne MRN: [id number] DOB: 03-04-1951   Referred by: Glendale Chard, MD Reason for referral : Chronic Care Management (RN CM Follow up call )   An unsuccessful telephone outreach was attempted today. The patient was referred to the case management team for assistance with care management and care coordination.   Follow Up Plan: A HIPPA compliant phone message was left for the patient providing contact information and requesting a return call.   Barb Merino, RN, BSN, CCM Care Management Coordinator [place] [facility]  Direct Phone: [phone]

## 2021-06-12 DIAGNOSIS — N186 End stage renal disease: Secondary | ICD-10-CM | POA: Diagnosis not present

## 2021-06-12 DIAGNOSIS — D631 Anemia in chronic kidney disease: Secondary | ICD-10-CM | POA: Diagnosis not present

## 2021-06-12 DIAGNOSIS — D509 Iron deficiency anemia, unspecified: Secondary | ICD-10-CM | POA: Diagnosis not present

## 2021-06-12 DIAGNOSIS — N2581 Secondary hyperparathyroidism of renal origin: Secondary | ICD-10-CM | POA: Diagnosis not present

## 2021-06-14 DIAGNOSIS — N2581 Secondary hyperparathyroidism of renal origin: Secondary | ICD-10-CM | POA: Diagnosis not present

## 2021-06-14 DIAGNOSIS — D631 Anemia in chronic kidney disease: Secondary | ICD-10-CM | POA: Diagnosis not present

## 2021-06-14 DIAGNOSIS — N186 End stage renal disease: Secondary | ICD-10-CM | POA: Diagnosis not present

## 2021-06-14 DIAGNOSIS — D509 Iron deficiency anemia, unspecified: Secondary | ICD-10-CM | POA: Diagnosis not present

## 2021-06-16 DIAGNOSIS — N2581 Secondary hyperparathyroidism of renal origin: Secondary | ICD-10-CM | POA: Diagnosis not present

## 2021-06-16 DIAGNOSIS — D631 Anemia in chronic kidney disease: Secondary | ICD-10-CM | POA: Diagnosis not present

## 2021-06-16 DIAGNOSIS — D509 Iron deficiency anemia, unspecified: Secondary | ICD-10-CM | POA: Diagnosis not present

## 2021-06-16 DIAGNOSIS — N186 End stage renal disease: Secondary | ICD-10-CM | POA: Diagnosis not present

## 2021-06-18 ENCOUNTER — Other Ambulatory Visit: Payer: Self-pay | Admitting: Internal Medicine

## 2021-06-18 DIAGNOSIS — N2581 Secondary hyperparathyroidism of renal origin: Secondary | ICD-10-CM | POA: Diagnosis not present

## 2021-06-18 DIAGNOSIS — D509 Iron deficiency anemia, unspecified: Secondary | ICD-10-CM | POA: Diagnosis not present

## 2021-06-18 DIAGNOSIS — N186 End stage renal disease: Secondary | ICD-10-CM | POA: Diagnosis not present

## 2021-06-18 DIAGNOSIS — D631 Anemia in chronic kidney disease: Secondary | ICD-10-CM | POA: Diagnosis not present

## 2021-06-19 ENCOUNTER — Ambulatory Visit (INDEPENDENT_AMBULATORY_CARE_PROVIDER_SITE_OTHER): Payer: Medicare HMO

## 2021-06-19 VITALS — Ht 66.5 in | Wt 228.0 lb

## 2021-06-19 DIAGNOSIS — Z0000 Encounter for general adult medical examination without abnormal findings: Secondary | ICD-10-CM

## 2021-06-19 NOTE — Patient Instructions (Signed)
Jennifer Payne , Thank you for taking time to come for your Medicare Wellness Visit. I appreciate your ongoing commitment to your health goals. Please review the following plan we discussed and let me know if I can assist you in the future.   Screening recommendations/referrals: Colonoscopy: completed 05/24/2014 Mammogram: completed 06/06/2021 Bone Density: completed 05/24/2018 Recommended yearly ophthalmology/optometry visit for glaucoma screening and checkup Recommended yearly dental visit for hygiene and checkup  Vaccinations: Influenza vaccine: completed 05/08/2021 Pneumococcal vaccine: due Tdap vaccine: completed 06/29/2012, due 06/29/2022 Shingles vaccine: discussed   Covid-19: 10/29/2020, 10/15/2019  Advanced directives: Please bring a copy of your POA (Power of Attorney) and/or Living Will to your next appointment.   Conditions/risks identified: none  Next appointment: Follow up in one year for your annual wellness visit    Preventive Care 65 Years and Older, Female Preventive care refers to lifestyle choices and visits with your health care provider that can promote health and wellness. What does preventive care include? A yearly physical exam. This is also called an annual well check. Dental exams once or twice a year. Routine eye exams. Ask your health care provider how often you should have your eyes checked. Personal lifestyle choices, including: Daily care of your teeth and gums. Regular physical activity. Eating a healthy diet. Avoiding tobacco and drug use. Limiting alcohol use. Practicing safe sex. Taking low-dose aspirin every day. Taking vitamin and mineral supplements as recommended by your health care provider. What happens during an annual well check? The services and screenings done by your health care provider during your annual well check will depend on your age, overall health, lifestyle risk factors, and family history of disease. Counseling  Your health  care provider may ask you questions about your: Alcohol use. Tobacco use. Drug use. Emotional well-being. Home and relationship well-being. Sexual activity. Eating habits. History of falls. Memory and ability to understand (cognition). Work and work Statistician. Reproductive health. Screening  You may have the following tests or measurements: Height, weight, and BMI. Blood pressure. Lipid and cholesterol levels. These may be checked every 5 years, or more frequently if you are over 32 years old. Skin check. Lung cancer screening. You may have this screening every year starting at age 78 if you have a 30-pack-year history of smoking and currently smoke or have quit within the past 15 years. Fecal occult blood test (FOBT) of the stool. You may have this test every year starting at age 57. Flexible sigmoidoscopy or colonoscopy. You may have a sigmoidoscopy every 5 years or a colonoscopy every 10 years starting at age 66. Hepatitis C blood test. Hepatitis B blood test. Sexually transmitted disease (STD) testing. Diabetes screening. This is done by checking your blood sugar (glucose) after you have not eaten for a while (fasting). You may have this done every 1-3 years. Bone density scan. This is done to screen for osteoporosis. You may have this done starting at age 69. Mammogram. This may be done every 1-2 years. Talk to your health care provider about how often you should have regular mammograms. Talk with your health care provider about your test results, treatment options, and if necessary, the need for more tests. Vaccines  Your health care provider may recommend certain vaccines, such as: Influenza vaccine. This is recommended every year. Tetanus, diphtheria, and acellular pertussis (Tdap, Td) vaccine. You may need a Td booster every 10 years. Zoster vaccine. You may need this after age 26. Pneumococcal 13-valent conjugate (PCV13) vaccine. One dose is recommended  after age  61. Pneumococcal polysaccharide (PPSV23) vaccine. One dose is recommended after age 62. Talk to your health care provider about which screenings and vaccines you need and how often you need them. This information is not intended to replace advice given to you by your health care provider. Make sure you discuss any questions you have with your health care provider. Document Released: 08/10/2015 Document Revised: 04/02/2016 Document Reviewed: 05/15/2015 Elsevier Interactive Patient Education  2017 [place] Prevention in the Home Falls can cause injuries. They can happen to people of all ages. There are many things you can do to make your home safe and to help prevent falls. What can I do on the outside of my home? Regularly fix the edges of walkways and driveways and fix any cracks. Remove anything that might make you trip as you walk through a door, such as a raised step or threshold. Trim any bushes or trees on the path to your home. Use bright outdoor lighting. Clear any walking paths of anything that might make someone trip, such as rocks or tools. Regularly check to see if handrails are loose or broken. Make sure that both sides of any steps have handrails. Any raised decks and porches should have guardrails on the edges. Have any leaves, snow, or ice cleared regularly. Use sand or salt on walking paths during winter. Clean up any spills in your garage right away. This includes oil or grease spills. What can I do in the bathroom? Use night lights. Install grab bars by the toilet and in the tub and shower. Do not use towel bars as grab bars. Use non-skid mats or decals in the tub or shower. If you need to sit down in the shower, use a plastic, non-slip stool. Keep the floor dry. Clean up any water that spills on the floor as soon as it happens. Remove soap buildup in the tub or shower regularly. Attach bath mats securely with double-sided non-slip rug tape. Do not have throw  rugs and other things on the floor that can make you trip. What can I do in the bedroom? Use night lights. Make sure that you have a light by your bed that is easy to reach. Do not use any sheets or blankets that are too big for your bed. They should not hang down onto the floor. Have a firm chair that has side arms. You can use this for support while you get dressed. Do not have throw rugs and other things on the floor that can make you trip. What can I do in the kitchen? Clean up any spills right away. Avoid walking on wet floors. Keep items that you use a lot in easy-to-reach places. If you need to reach something above you, use a strong step stool that has a grab bar. Keep electrical cords out of the way. Do not use floor polish or wax that makes floors slippery. If you must use wax, use non-skid floor wax. Do not have throw rugs and other things on the floor that can make you trip. What can I do with my stairs? Do not leave any items on the stairs. Make sure that there are handrails on both sides of the stairs and use them. Fix handrails that are broken or loose. Make sure that handrails are as long as the stairways. Check any carpeting to make sure that it is firmly attached to the stairs. Fix any carpet that is loose or worn. Avoid having throw  rugs at the top or bottom of the stairs. If you do have throw rugs, attach them to the floor with carpet tape. Make sure that you have a light switch at the top of the stairs and the bottom of the stairs. If you do not have them, ask someone to add them for you. What else can I do to help prevent falls? Wear shoes that: Do not have high heels. Have rubber bottoms. Are comfortable and fit you well. Are closed at the toe. Do not wear sandals. If you use a stepladder: Make sure that it is fully opened. Do not climb a closed stepladder. Make sure that both sides of the stepladder are locked into place. Ask someone to hold it for you, if  possible. Clearly mark and make sure that you can see: Any grab bars or handrails. First and last steps. Where the edge of each step is. Use tools that help you move around (mobility aids) if they are needed. These include: Canes. Walkers. Scooters. Crutches. Turn on the lights when you go into a dark area. Replace any light bulbs as soon as they burn out. Set up your furniture so you have a clear path. Avoid moving your furniture around. If any of your floors are uneven, fix them. If there are any pets around you, be aware of where they are. Review your medicines with your doctor. Some medicines can make you feel dizzy. This can increase your chance of falling. Ask your doctor what other things that you can do to help prevent falls. This information is not intended to replace advice given to you by your health care provider. Make sure you discuss any questions you have with your health care provider. Document Released: 05/10/2009 Document Revised: 12/20/2015 Document Reviewed: 08/18/2014 Elsevier Interactive Patient Education  2017 [employer].

## 2021-06-19 NOTE — Progress Notes (Signed)
I connected with Jennifer Payne today by telephone and verified that I am speaking with the correct person using two identifiers. Location patient: home Location provider: work Persons participating in the virtual visit: Jennifer Payne, [place] LPN.   I discussed the limitations, risks, security and privacy concerns of performing an evaluation and management service by telephone and the availability of in person appointments. I also discussed with the patient that there may be a patient responsible charge related to this service. The patient expressed understanding and verbally consented to this telephonic visit.    Interactive audio and video telecommunications were attempted between this provider and patient, however failed, due to patient having technical difficulties OR patient did not have access to video capability.  We continued and completed visit with audio only.     Vital signs may be patient reported or missing.  Subjective:   Jennifer Payne is a 70 y.o. female who presents for Medicare Annual (Subsequent) preventive examination.  Review of Systems     Cardiac Risk Factors include: advanced age (>77men, >30 women);diabetes mellitus;hypertension;obesity (BMI >30kg/m2)     Objective:    Today's Vitals   06/19/21 0831  Weight: 228 lb (103.4 kg)  Height: 5' 6.5" (1.689 m)   Body mass index is 36.25 kg/m.  Advanced Directives 06/19/2021 05/03/2020 02/23/2019 05/10/2014  Does Patient Have a Medical Advance Directive? Yes Yes No No  Type of Paramedic of [place];Living will [place] in Chart? No - copy requested No - copy requested - -  Would patient like information on creating a medical advance directive? - - Yes (MAU/Ambulatory/Procedural Areas - Information given) -    Current Medications (verified) Outpatient Encounter Medications as of 06/19/2021  Medication Sig    atorvastatin (LIPITOR) 80 MG tablet Take 1 tablet by mouth once daily   calcium acetate (PHOSLO) 667 MG tablet TAKE 1 TABLET BY MOUTH ONCE DAILY WITH MEALS   levothyroxine (SYNTHROID) 88 MCG tablet Take 1 tablet by mouth once daily   sevelamer carbonate (RENVELA) 800 MG tablet TAKE 3 TABLETS BY MOUTH WITH MEALS   amoxicillin-clavulanate (AUGMENTIN) 500-125 MG tablet TAKE 1 TABLET BY MOUTH BY MOUTH FOR 10 DAYS (Patient not taking: Reported on 06/19/2021)   No facility-administered encounter medications on file as of 06/19/2021.    Allergies (verified) Erythromycin   History: Past Medical History:  Diagnosis Date   Cataract    Diabetes mellitus    Hyperlipidemia    Hypertension    Thyroid disease    Past Surgical History:  Procedure Laterality Date   AV FISTULA PLACEMENT Left 10/2019   CATARACT EXTRACTION     COLONOSCOPY  2009   TONSILLECTOMY  1973   Family History  Problem Relation Age of Onset   Alzheimer's disease Mother    Coronary artery disease Mother    Hyperlipidemia Mother    Obesity Mother    Hypertension Father    Colon cancer Neg Hx    Social History   Socioeconomic History   Marital status: Divorced    Spouse name: Not on file   Number of children: Not on file   Years of education: Not on file   Highest education level: Not on file  Occupational History   Occupation: retired  Tobacco Use   Smoking status: Never   Smokeless tobacco: Never  Vaping Use   Vaping Use: Never used  Substance and Sexual Activity  Alcohol use: No   Drug use: No   Sexual activity: Not Currently  Other Topics Concern   Not on file  Social History Narrative   Not on file   Social Determinants of Health   Financial Resource Strain: Low Risk    Difficulty of Paying Living Expenses: Not hard at all  Food Insecurity: No Food Insecurity   Worried About Charity fundraiser in the Last Year: Never true   [place] in the Last Year: Never true  Transportation Needs:  No Transportation Needs   Lack of Transportation (Medical): No   Lack of Transportation (Non-Medical): No  Physical Activity: Inactive   Days of Exercise per Week: 0 days   Minutes of Exercise per Session: 0 min  Stress: No Stress Concern Present   Feeling of Stress : Not at all  Social Connections: Not on file    Tobacco Counseling Counseling given: Not Answered   Clinical Intake:  Pre-visit preparation completed: Yes  Pain : No/denies pain     Nutritional Status: BMI > 30  Obese Nutritional Risks: None Diabetes: Yes  How often do you need to have someone help you when you read instructions, pamphlets, or other written materials from your doctor or pharmacy?: 1 - Never What is the last grade level you completed in school?: masters degree  Diabetic? Yes Nutrition Risk Assessment:  Has the patient had any N/V/D within the last 2 months?  No  Does the patient have any non-healing wounds?  No  Has the patient had any unintentional weight loss or weight gain?  No   Diabetes:  Is the patient diabetic?  Yes  If diabetic, was a CBG obtained today?  No  Did the patient bring in their glucometer from home?  No  How often do you monitor your CBG's? Does not.   Financial Strains and Diabetes Management:  Are you having any financial strains with the device, your supplies or your medication? No .  Does the patient want to be seen by Chronic Care Management for management of their diabetes?  No  Would the patient like to be referred to a Nutritionist or for Diabetic Management?  No   Diabetic Exams:  Diabetic Eye Exam: Completed 05/28/2021 Diabetic Foot Exam: Overdue, Pt has been advised about the importance in completing this exam. Pt is scheduled for diabetic foot exam on next appointment.   Interpreter Needed?: No  Information entered by :: NAllen LPN   Activities of Daily Living In your present state of health, do you have any difficulty performing the following  activities: 06/19/2021  Hearing? N  Vision? N  Difficulty concentrating or making decisions? N  Walking or climbing stairs? N  Dressing or bathing? N  Doing errands, shopping? N  Preparing Food and eating ? N  Using the Toilet? N  In the past six months, have you accidently leaked urine? N  Do you have problems with loss of bowel control? N  Managing your Medications? N  Managing your Finances? N  Housekeeping or managing your Housekeeping? N  Some recent data might be hidden    Patient Care Team: Glendale Chard, MD as PCP - General (Internal Medicine) Rex Kras, Claudette Stapler, RN as Case Manager  Indicate any recent Medical Services you may have received from other than [facility] providers in the past year (date may be approximate).     Assessment:   This is a routine wellness examination for Jennifer Payne.  Hearing/Vision screen Vision  Screening - Comments:: Regular eye exams, Dr. Sharen Counter  Dietary issues and exercise activities discussed: Current Exercise Habits: The patient does not participate in regular exercise at present   Goals Addressed             This Visit's Progress    Patient Stated       06/19/2021, lose weight       Depression Screen PHQ 2/9 Scores 06/19/2021 05/03/2020 02/23/2019 12/30/2018 08/19/2018 05/19/2018  PHQ - 2 Score 0 0 0 0 0 0  PHQ- 9 Score - - 1 - - -    Fall Risk Fall Risk  06/19/2021 05/03/2020 02/23/2019 12/30/2018 08/19/2018  Falls in the past year? 0 1 0 0 0  Comment - tripped on uneven pavement - - -  Number falls in past yr: - 0 - - -  Injury with Fall? - 0 - - -  Risk for fall due to : No Fall Risks History of fall(s);Medication side effect Medication side effect - -  Follow up Falls evaluation completed;Education provided;Falls prevention discussed Falls evaluation completed;Education provided;Falls prevention discussed Falls evaluation completed;Education provided;Falls prevention discussed - -    FALL RISK PREVENTION PERTAINING TO THE HOME:  Any  stairs in or around the home? Yes  If so, are there any without handrails? No  Home free of loose throw rugs in walkways, pet beds, electrical cords, etc? Yes  Adequate lighting in your home to reduce risk of falls? Yes   ASSISTIVE DEVICES UTILIZED TO PREVENT FALLS:  Life alert? No  Use of a cane, walker or w/c? No  Grab bars in the bathroom? No  Shower chair or bench in shower? Yes  Elevated toilet seat or a handicapped toilet? No   TIMED UP AND GO:  Was the test performed? No .      Cognitive Function:     6CIT Screen 06/19/2021 05/03/2020 02/23/2019  What Year? 0 points 0 points 0 points  What month? 0 points 0 points 0 points  What time? 0 points 0 points 0 points  Count back from 20 0 points 0 points 0 points  Months in reverse 0 points 0 points 0 points  Repeat phrase 6 points 0 points 0 points  Total Score 6 0 0    Immunizations Immunization History  Administered Date(s) Administered   DTaP 06/13/2013   Influenza, High Dose Seasonal PF 05/19/2018, 05/08/2021   Janssen (J&J) SARS-COV-2 Vaccination 10/15/2019   Moderna Sars-Covid-2 Vaccination 10/29/2020    TDAP status: Up to date  Flu Vaccine status: Up to date  Pneumococcal vaccine status: Due, Education has been provided regarding the importance of this vaccine. Advised may receive this vaccine at local pharmacy or Health Dept. Aware to provide a copy of the vaccination record if obtained from local pharmacy or Health Dept. Verbalized acceptance and understanding.  Covid-19 vaccine status: Completed vaccines  Qualifies for Shingles Vaccine? Yes   Zostavax completed No   Shingrix Completed?: No.    Education has been provided regarding the importance of this vaccine. Patient has been advised to call insurance company to determine out of pocket expense if they have not yet received this vaccine. Advised may also receive vaccine at local pharmacy or Health Dept. Verbalized acceptance and  understanding.  Screening Tests Health Maintenance  Topic Date Due   Pneumonia Vaccine 24+ Years old (1 - PCV) Never done   URINE MICROALBUMIN  02/23/2020   COVID-19 Vaccine (3 - Booster for Janssen series) 12/24/2020  FOOT EXAM  05/03/2021   Zoster Vaccines- Shingrix (1 of 2) 09/19/2021 (Originally 08/29/1969)   HEMOGLOBIN A1C  07/12/2021   OPHTHALMOLOGY EXAM  05/28/2022   TETANUS/TDAP  06/29/2022   MAMMOGRAM  06/07/2023   COLONOSCOPY (Pts 45-31yrs Insurance coverage will need to be confirmed)  05/24/2024   INFLUENZA VACCINE  Completed   DEXA SCAN  Completed   Hepatitis C Screening  Completed   HPV VACCINES  Aged Out    Health Maintenance  Health Maintenance Due  Topic Date Due   Pneumonia Vaccine 11+ Years old (1 - PCV) Never done   URINE MICROALBUMIN  02/23/2020   COVID-19 Vaccine (3 - Booster for Janssen series) 12/24/2020   FOOT EXAM  05/03/2021    Colorectal cancer screening: Type of screening: Colonoscopy. Completed 05/24/2014. Repeat every 10 years  Mammogram status: Completed 06/06/2021. Repeat every year  Bone Density status: Completed 05/24/2018.   Lung Cancer Screening: (Low Dose CT Chest recommended if Age 64-80 years, 30 pack-year currently smoking OR have quit w/in 15years.) does not qualify.   Lung Cancer Screening Referral: no  Additional Screening:  Hepatitis C Screening: does qualify; Completed 11/06/2016  Vision Screening: Recommended annual ophthalmology exams for early detection of glaucoma and other disorders of the eye. Is the patient up to date with their annual eye exam?  Yes  Who is the provider or what is the name of the office in which the patient attends annual eye exams? Dr. Sharen Counter If pt is not established with a provider, would they like to be referred to a provider to establish care? No .   Dental Screening: Recommended annual dental exams for proper oral hygiene  Community Resource Referral / Chronic Care Management: CRR required  this visit?  No   CCM required this visit?  No      Plan:     I have personally reviewed and noted the following in the patient's chart:   Medical and social history Use of alcohol, tobacco or illicit drugs  Current medications and supplements including opioid prescriptions.  Functional ability and status Nutritional status Physical activity Advanced directives List of other physicians Hospitalizations, surgeries, and ER visits in previous 12 months Vitals Screenings to include cognitive, depression, and falls Referrals and appointments  In addition, I have reviewed and discussed with patient certain preventive protocols, quality metrics, and best practice recommendations. A written personalized care plan for preventive services as well as general preventive health recommendations were provided to patient.     Kellie Simmering, LPN   [id number]   Nurse Notes: none

## 2021-06-21 DIAGNOSIS — D631 Anemia in chronic kidney disease: Secondary | ICD-10-CM | POA: Diagnosis not present

## 2021-06-21 DIAGNOSIS — N186 End stage renal disease: Secondary | ICD-10-CM | POA: Diagnosis not present

## 2021-06-21 DIAGNOSIS — N2581 Secondary hyperparathyroidism of renal origin: Secondary | ICD-10-CM | POA: Diagnosis not present

## 2021-06-21 DIAGNOSIS — D509 Iron deficiency anemia, unspecified: Secondary | ICD-10-CM | POA: Diagnosis not present

## 2021-06-24 DIAGNOSIS — D631 Anemia in chronic kidney disease: Secondary | ICD-10-CM | POA: Diagnosis not present

## 2021-06-24 DIAGNOSIS — N186 End stage renal disease: Secondary | ICD-10-CM | POA: Diagnosis not present

## 2021-06-24 DIAGNOSIS — D509 Iron deficiency anemia, unspecified: Secondary | ICD-10-CM | POA: Diagnosis not present

## 2021-06-24 DIAGNOSIS — N2581 Secondary hyperparathyroidism of renal origin: Secondary | ICD-10-CM | POA: Diagnosis not present

## 2021-06-26 DIAGNOSIS — D509 Iron deficiency anemia, unspecified: Secondary | ICD-10-CM | POA: Diagnosis not present

## 2021-06-26 DIAGNOSIS — I120 Hypertensive chronic kidney disease with stage 5 chronic kidney disease or end stage renal disease: Secondary | ICD-10-CM | POA: Diagnosis not present

## 2021-06-26 DIAGNOSIS — N186 End stage renal disease: Secondary | ICD-10-CM | POA: Diagnosis not present

## 2021-06-26 DIAGNOSIS — E1122 Type 2 diabetes mellitus with diabetic chronic kidney disease: Secondary | ICD-10-CM | POA: Diagnosis not present

## 2021-06-26 DIAGNOSIS — N2581 Secondary hyperparathyroidism of renal origin: Secondary | ICD-10-CM | POA: Diagnosis not present

## 2021-06-26 DIAGNOSIS — Z992 Dependence on renal dialysis: Secondary | ICD-10-CM

## 2021-06-26 DIAGNOSIS — Z1159 Encounter for screening for other viral diseases: Secondary | ICD-10-CM | POA: Diagnosis not present

## 2021-06-26 DIAGNOSIS — D631 Anemia in chronic kidney disease: Secondary | ICD-10-CM | POA: Diagnosis not present

## 2021-06-27 DIAGNOSIS — Z1159 Encounter for screening for other viral diseases: Secondary | ICD-10-CM | POA: Diagnosis not present

## 2021-06-27 DIAGNOSIS — N186 End stage renal disease: Secondary | ICD-10-CM | POA: Diagnosis not present

## 2021-06-27 DIAGNOSIS — Z79899 Other long term (current) drug therapy: Secondary | ICD-10-CM | POA: Diagnosis not present

## 2021-06-27 DIAGNOSIS — I120 Hypertensive chronic kidney disease with stage 5 chronic kidney disease or end stage renal disease: Secondary | ICD-10-CM | POA: Diagnosis not present

## 2021-06-27 DIAGNOSIS — E11319 Type 2 diabetes mellitus with unspecified diabetic retinopathy without macular edema: Secondary | ICD-10-CM | POA: Diagnosis not present

## 2021-06-27 DIAGNOSIS — I10 Essential (primary) hypertension: Secondary | ICD-10-CM | POA: Diagnosis not present

## 2021-06-27 DIAGNOSIS — Z992 Dependence on renal dialysis: Secondary | ICD-10-CM | POA: Diagnosis not present

## 2021-06-27 DIAGNOSIS — Z01818 Encounter for other preprocedural examination: Secondary | ICD-10-CM | POA: Diagnosis not present

## 2021-06-27 DIAGNOSIS — D631 Anemia in chronic kidney disease: Secondary | ICD-10-CM | POA: Diagnosis not present

## 2021-06-28 DIAGNOSIS — D631 Anemia in chronic kidney disease: Secondary | ICD-10-CM | POA: Diagnosis not present

## 2021-06-28 DIAGNOSIS — N186 End stage renal disease: Secondary | ICD-10-CM | POA: Diagnosis not present

## 2021-06-28 DIAGNOSIS — N2581 Secondary hyperparathyroidism of renal origin: Secondary | ICD-10-CM | POA: Diagnosis not present

## 2021-06-28 DIAGNOSIS — D509 Iron deficiency anemia, unspecified: Secondary | ICD-10-CM | POA: Diagnosis not present

## 2021-07-01 DIAGNOSIS — N2581 Secondary hyperparathyroidism of renal origin: Secondary | ICD-10-CM | POA: Diagnosis not present

## 2021-07-01 DIAGNOSIS — D631 Anemia in chronic kidney disease: Secondary | ICD-10-CM | POA: Diagnosis not present

## 2021-07-01 DIAGNOSIS — N186 End stage renal disease: Secondary | ICD-10-CM | POA: Diagnosis not present

## 2021-07-01 DIAGNOSIS — D509 Iron deficiency anemia, unspecified: Secondary | ICD-10-CM | POA: Diagnosis not present

## 2021-07-03 DIAGNOSIS — D631 Anemia in chronic kidney disease: Secondary | ICD-10-CM | POA: Diagnosis not present

## 2021-07-03 DIAGNOSIS — N186 End stage renal disease: Secondary | ICD-10-CM | POA: Diagnosis not present

## 2021-07-03 DIAGNOSIS — E039 Hypothyroidism, unspecified: Secondary | ICD-10-CM | POA: Diagnosis not present

## 2021-07-03 DIAGNOSIS — D509 Iron deficiency anemia, unspecified: Secondary | ICD-10-CM | POA: Diagnosis not present

## 2021-07-03 DIAGNOSIS — N2581 Secondary hyperparathyroidism of renal origin: Secondary | ICD-10-CM | POA: Diagnosis not present

## 2021-07-05 DIAGNOSIS — N186 End stage renal disease: Secondary | ICD-10-CM | POA: Diagnosis not present

## 2021-07-05 DIAGNOSIS — D509 Iron deficiency anemia, unspecified: Secondary | ICD-10-CM | POA: Diagnosis not present

## 2021-07-05 DIAGNOSIS — N2581 Secondary hyperparathyroidism of renal origin: Secondary | ICD-10-CM | POA: Diagnosis not present

## 2021-07-05 DIAGNOSIS — D631 Anemia in chronic kidney disease: Secondary | ICD-10-CM | POA: Diagnosis not present

## 2021-07-08 ENCOUNTER — Telehealth: Payer: Self-pay

## 2021-07-08 DIAGNOSIS — D631 Anemia in chronic kidney disease: Secondary | ICD-10-CM | POA: Diagnosis not present

## 2021-07-08 DIAGNOSIS — N186 End stage renal disease: Secondary | ICD-10-CM | POA: Diagnosis not present

## 2021-07-08 DIAGNOSIS — N2581 Secondary hyperparathyroidism of renal origin: Secondary | ICD-10-CM | POA: Diagnosis not present

## 2021-07-08 DIAGNOSIS — D509 Iron deficiency anemia, unspecified: Secondary | ICD-10-CM | POA: Diagnosis not present

## 2021-07-08 NOTE — Telephone Encounter (Signed)
Open pts chart referring to AWV for workflow.

## 2021-07-09 ENCOUNTER — Telehealth: Payer: BC Managed Care – PPO

## 2021-07-09 ENCOUNTER — Ambulatory Visit: Payer: Self-pay

## 2021-07-09 DIAGNOSIS — Z992 Dependence on renal dialysis: Secondary | ICD-10-CM

## 2021-07-09 DIAGNOSIS — E1122 Type 2 diabetes mellitus with diabetic chronic kidney disease: Secondary | ICD-10-CM

## 2021-07-09 DIAGNOSIS — I129 Hypertensive chronic kidney disease with stage 1 through stage 4 chronic kidney disease, or unspecified chronic kidney disease: Secondary | ICD-10-CM

## 2021-07-09 DIAGNOSIS — N186 End stage renal disease: Secondary | ICD-10-CM

## 2021-07-09 NOTE — Patient Instructions (Addendum)
Visit Information   Thank you for taking time to visit with me today. Please don't hesitate to contact me if I can be of assistance to you before our next scheduled telephone appointment.  Following are the goals we discussed today:  (Copy and paste patient goals from clinical care plan here)  Our next appointment is by telephone on 08/08/21 at 9:20 AM  Please call the care guide team at [phone] if you need to cancel or reschedule your appointment.   If you are experiencing a Mental Health or [place] or need someone to talk to, please call 1-800-273-TALK (toll free, 24 hour hotline)   Following is a copy of your full care plan:  Care Plan : [place] [facility] Care  Updates made by Lynne Logan, RN since 07/09/2021 12:00 AM     Problem: No plan of care established for mangement of chronic care states (ESRD on dialysis, 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, Hypertensive nephropathy)   Priority: High     Long-Range Goal: Development of plan of care for chronic disease management for ESRD on dialysis, 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, Hypertensive nephropathy   Start Date: 05/31/2021  Expected End Date: 04/30/2022  Recent Progress: On track  Priority: High  Note:   Current Barriers:  Knowledge Deficits related to plan of care for management of ESRD on dialysis, 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, Hypertensive nephropathy Chronic Disease Management support and education needs related to ESRD on dialysis, 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, Hypertensive nephropathy  RNCM Clinical Goal(s):  Patient will verbalize basic understanding of  ESRD on dialysis, 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, Hypertensive nephropathy disease process and self health management plan   attend all scheduled medical appointments: patient will schedule AWV with PCP and DM follow up visit with PCP provider  demonstrate  Improved health management independence   continue to work with RN Care Manager to address care management and care coordination needs related to  ESRD on dialysis, 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   Interventions: 1:1 collaboration with primary care provider regarding development and update of comprehensive plan of care as evidenced by provider attestation and co-signature Inter-disciplinary care team collaboration (see longitudinal plan of care) Evaluation of current treatment plan related to  self management and patient's adherence to plan as established by provider  Hypertension Interventions: (Status: Condition stable. Not address during this encounter) Last practice recorded BP readings:  BP Readings from Last 3 Encounters:  01/10/21 122/80  05/03/20 130/64  05/03/20 130/64  Most recent eGFR/CrCl: No results found for: EGFR  No components found for: CRCL  Evaluation of current treatment plan related to hypertension self management and patient's adherence to plan as established by provider; Provided education to patient re: stroke prevention, s/s of heart attack and stroke; Counseled on the importance of exercise goals with target of 150 minutes per week Discussed plans with patient for ongoing care management follow up and provided patient with direct contact information for care management team; Advised patient, providing education and rationale, to monitor blood pressure daily and record, calling PCP for findings outside established parameters;  Provided education on prescribed diet renal diet;  Discussed complications of poorly controlled blood pressure such  as heart disease, stroke, circulatory complications, vision complications, kidney impairment, sexual dysfunction;  Assessed social determinant of health  barriers;   Diabetes Interventions: (Status: Goal on track, No) Assessed patient's understanding of A1c goal: <7% Determined patient discontinued her insulin and stopped self monitoring her cbg's x 1 year since lowering A1c in 10/21 to 6.4 Reviewed medications with patient and discussed importance of medication adherence Counseled on importance of regular laboratory monitoring as prescribed Review of patient status, including review of consultants reports, relevant laboratory and other test results, and medications completed Assessed social determinant of health barriers Collaborated with office staff to help get patient scheduled for PCP follow up for evaluation and treatment of DM Reviewed scheduled/upcoming provider appointments including: PCP DM follow up scheduled for 08/06/21 _0 :20 AM Lab Results  Component Value Date   HGBA1C 7.0 (H) 01/10/2021  Patient Goals/Self-Care Activities: Take all medications as prescribed Attend all scheduled provider appointments Call pharmacy for medication refills 3-7 days in advance of running out of medications Perform all self care activities independently  Perform IADL's (shopping, preparing meals, housekeeping, managing finances) independently Call provider office for new concerns or questions  Keep PCP follow up appointment with Dr. Baird Cancer for evaluation and treatment of Diabetes call doctor for signs and symptoms of high blood pressure take medications for blood pressure exactly as prescribed report new symptoms to your doctor  Follow Up Plan:  Telephone follow up appointment with care management team member scheduled for:  08/08/21 _1 :20 AM      Consent to CCM Services: Ms. Wardrip was given information about Chronic Care Management services including:  CCM service includes personalized support from designated clinical staff supervised by her physician, including individualized plan of care and coordination with other care providers 24/7  contact phone numbers for assistance for urgent and routine care needs. Service will only be billed when office clinical staff spend 20 minutes or more in a month to coordinate care. Only one practitioner may furnish and bill the service in a calendar month. The patient may stop CCM services at any time (effective at the end of the month) by phone call to the office staff. The patient will be responsible for cost sharing (co-pay) of up to 20% of the service fee (after annual deductible is met).  Patient agreed to services and verbal consent obtained.   Patient verbalizes understanding of instructions provided today and agrees to view in [place].   Telephone follow up appointment with care management team member scheduled for: 08/08/21 _2 :20 AM

## 2021-07-09 NOTE — Chronic Care Management (AMB) (Signed)
Chronic Care Management   CCM RN Visit Note  07/09/2021 Name: Jennifer Payne MRN: [id number] DOB: 1951/07/27  Subjective: Jennifer Payne is a 70 y.o. year old female who is a primary care patient of Glendale Chard, MD. The care management team was consulted for assistance with disease management and care coordination needs.    Engaged with patient by telephone for follow up visit in response to provider referral for case management and/or care coordination services.   Consent to Services:  The patient was given information about Chronic Care Management services, agreed to services, and gave verbal consent prior to initiation of services.  Please see initial visit note for detailed documentation.   Patient agreed to services and verbal consent obtained.   Assessment: Review of patient past medical history, allergies, medications, health status, including review of consultants reports, laboratory and other test data, was performed as part of comprehensive evaluation and provision of chronic care management services.   SDOH (Social Determinants of Health) assessments and interventions performed: Yes, no acute challenges    CCM Care Plan  Allergies  Allergen Reactions   Erythromycin Rash    Outpatient Encounter Medications as of 07/09/2021  Medication Sig   amoxicillin-clavulanate (AUGMENTIN) 500-125 MG tablet TAKE 1 TABLET BY MOUTH BY MOUTH FOR 10 DAYS (Patient not taking: Reported on 06/19/2021)   atorvastatin (LIPITOR) 80 MG tablet Take 1 tablet by mouth once daily   calcium acetate (PHOSLO) 667 MG tablet TAKE 1 TABLET BY MOUTH ONCE DAILY WITH MEALS   levothyroxine (SYNTHROID) 88 MCG tablet Take 1 tablet by mouth once daily   sevelamer carbonate (RENVELA) 800 MG tablet TAKE 3 TABLETS BY MOUTH WITH MEALS   No facility-administered encounter medications on file as of 07/09/2021.    Patient Active Problem List   Diagnosis Date Noted   Pain in toes of both feet 05/20/2020    Class 1 obesity due to excess calories with serious comorbidity and body mass index (BMI) of 34.0 to 34.9 in adult 05/20/2020   Type 2 diabetes mellitus with stage 4 chronic kidney disease, with long-term current use of insulin ([place]) 08/19/2018   Hypertensive nephropathy 08/19/2018   Primary hypothyroidism 08/19/2018   Proliferative diabetic retinopathy associated with type 2 diabetes mellitus ([place]) 08/19/2018   Encounter for general adult medical examination without abnormal findings 01/14/2018    Conditions to be addressed/monitored: ESRD on dialysis, 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, Hypertensive nephropathy  Care Plan : RN Care Manager Plan of Care  Updates made by Lynne Logan, RN since 07/09/2021 12:00 AM     Problem: No plan of care established for mangement of chronic care states (ESRD on dialysis, 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, Hypertensive nephropathy)   Priority: High     Long-Range Goal: Development of plan of care for chronic disease management for ESRD on dialysis, 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, Hypertensive nephropathy   Start Date: 05/31/2021  Expected End Date: 04/30/2022  Recent Progress: On track  Priority: High  Note:   Current Barriers:  Knowledge Deficits related to plan of care for management of ESRD on dialysis, 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, Hypertensive nephropathy Chronic Disease Management support and education needs related to ESRD on dialysis, 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, Hypertensive nephropathy  RNCM Clinical Goal(s):  Patient will verbalize basic understanding of  ESRD on dialysis, Type 2 DM with hypertension  and ESRD on dialysis, Hypertensive nephropathy disease process and self health management plan   attend all scheduled medical appointments: patient will schedule AWV with PCP and DM follow up visit with PCP provider  demonstrate Improved health  management independence   continue to work with RN Care Manager to address care management and care coordination needs related to  ESRD on dialysis, 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   Interventions: 1:1 collaboration with primary care provider regarding development and update of comprehensive plan of care as evidenced by provider attestation and co-signature Inter-disciplinary care team collaboration (see longitudinal plan of care) Evaluation of current treatment plan related to  self management and patient's adherence to plan as established by provider  Hypertension Interventions: (Status: Condition stable. Not address during this encounter) Last practice recorded BP readings:  BP Readings from Last 3 Encounters:  01/10/21 122/80  05/03/20 130/64  05/03/20 130/64  Most recent eGFR/CrCl: No results found for: EGFR  No components found for: CRCL  Evaluation of current treatment plan related to hypertension self management and patient's adherence to plan as established by provider; Provided education to patient re: stroke prevention, s/s of heart attack and stroke; Counseled on the importance of exercise goals with target of 150 minutes per week Discussed plans with patient for ongoing care management follow up and provided patient with direct contact information for care management team; Advised patient, providing education and rationale, to monitor blood pressure daily and record, calling PCP for findings outside established parameters;  Provided education on prescribed diet renal diet;  Discussed complications of poorly controlled blood pressure such as heart disease, stroke, circulatory complications, vision complications, kidney impairment, sexual dysfunction;  Assessed social determinant of health barriers;    Diabetes Interventions: (Status: Goal on track, No) Assessed patient's understanding of A1c goal: <7% Determined patient discontinued her insulin and stopped self monitoring her cbg's x 1 year since lowering A1c in 10/21 to 6.4 Reviewed medications with patient and discussed importance of medication adherence Counseled on importance of regular laboratory monitoring as prescribed Review of patient status, including review of consultants reports, relevant laboratory and other test results, and medications completed Assessed social determinant of health barriers Collaborated with office staff to help get patient scheduled for PCP follow up for evaluation and treatment of DM Reviewed scheduled/upcoming provider appointments including: PCP DM follow up scheduled for 08/06/21 $RemoveBef'@9'[id number]$ :20 AM Lab Results  Component Value Date   HGBA1C 7.0 (H) 01/10/2021  Patient Goals/Self-Care Activities: Take all medications as prescribed Attend all scheduled provider appointments Call pharmacy for medication refills 3-7 days in advance of running out of medications Perform all self care activities independently  Perform IADL's (shopping, preparing meals, housekeeping, managing finances) independently Call provider office for new concerns or questions  Keep PCP follow up appointment with Dr. Baird Cancer for evaluation and treatment of Diabetes call doctor for signs and symptoms of high blood pressure take medications for blood pressure exactly as prescribed report new symptoms to your doctor  Follow Up Plan:  Telephone follow up appointment with care management team member scheduled for:  08/08/21 $Remove'@9'[id number]$ :20 AM      Plan:Telephone follow up appointment with care management team member scheduled for:  08/08/21  Barb Merino, RN, BSN, CCM Care Management Coordinator [facility]  Direct Phone: [phone]

## 2021-07-10 DIAGNOSIS — N2581 Secondary hyperparathyroidism of renal origin: Secondary | ICD-10-CM | POA: Diagnosis not present

## 2021-07-10 DIAGNOSIS — D509 Iron deficiency anemia, unspecified: Secondary | ICD-10-CM | POA: Diagnosis not present

## 2021-07-10 DIAGNOSIS — N186 End stage renal disease: Secondary | ICD-10-CM | POA: Diagnosis not present

## 2021-07-10 DIAGNOSIS — D631 Anemia in chronic kidney disease: Secondary | ICD-10-CM | POA: Diagnosis not present

## 2021-07-12 DIAGNOSIS — N2581 Secondary hyperparathyroidism of renal origin: Secondary | ICD-10-CM | POA: Diagnosis not present

## 2021-07-12 DIAGNOSIS — D509 Iron deficiency anemia, unspecified: Secondary | ICD-10-CM | POA: Diagnosis not present

## 2021-07-12 DIAGNOSIS — N186 End stage renal disease: Secondary | ICD-10-CM | POA: Diagnosis not present

## 2021-07-12 DIAGNOSIS — D631 Anemia in chronic kidney disease: Secondary | ICD-10-CM | POA: Diagnosis not present

## 2021-07-15 DIAGNOSIS — N2581 Secondary hyperparathyroidism of renal origin: Secondary | ICD-10-CM | POA: Diagnosis not present

## 2021-07-15 DIAGNOSIS — D509 Iron deficiency anemia, unspecified: Secondary | ICD-10-CM | POA: Diagnosis not present

## 2021-07-15 DIAGNOSIS — D631 Anemia in chronic kidney disease: Secondary | ICD-10-CM | POA: Diagnosis not present

## 2021-07-15 DIAGNOSIS — N186 End stage renal disease: Secondary | ICD-10-CM | POA: Diagnosis not present

## 2021-07-17 DIAGNOSIS — N2581 Secondary hyperparathyroidism of renal origin: Secondary | ICD-10-CM | POA: Diagnosis not present

## 2021-07-17 DIAGNOSIS — N186 End stage renal disease: Secondary | ICD-10-CM | POA: Diagnosis not present

## 2021-07-17 DIAGNOSIS — D631 Anemia in chronic kidney disease: Secondary | ICD-10-CM | POA: Diagnosis not present

## 2021-07-17 DIAGNOSIS — D509 Iron deficiency anemia, unspecified: Secondary | ICD-10-CM | POA: Diagnosis not present

## 2021-07-19 DIAGNOSIS — N2581 Secondary hyperparathyroidism of renal origin: Secondary | ICD-10-CM | POA: Diagnosis not present

## 2021-07-19 DIAGNOSIS — D509 Iron deficiency anemia, unspecified: Secondary | ICD-10-CM | POA: Diagnosis not present

## 2021-07-19 DIAGNOSIS — N186 End stage renal disease: Secondary | ICD-10-CM | POA: Diagnosis not present

## 2021-07-19 DIAGNOSIS — D631 Anemia in chronic kidney disease: Secondary | ICD-10-CM | POA: Diagnosis not present

## 2021-07-22 DIAGNOSIS — Z992 Dependence on renal dialysis: Secondary | ICD-10-CM | POA: Diagnosis not present

## 2021-07-22 DIAGNOSIS — N186 End stage renal disease: Secondary | ICD-10-CM | POA: Diagnosis not present

## 2021-07-22 DIAGNOSIS — N2581 Secondary hyperparathyroidism of renal origin: Secondary | ICD-10-CM | POA: Diagnosis not present

## 2021-07-24 DIAGNOSIS — N2581 Secondary hyperparathyroidism of renal origin: Secondary | ICD-10-CM | POA: Diagnosis not present

## 2021-07-24 DIAGNOSIS — N186 End stage renal disease: Secondary | ICD-10-CM | POA: Diagnosis not present

## 2021-07-24 DIAGNOSIS — Z992 Dependence on renal dialysis: Secondary | ICD-10-CM | POA: Diagnosis not present

## 2021-07-26 DIAGNOSIS — N2581 Secondary hyperparathyroidism of renal origin: Secondary | ICD-10-CM | POA: Diagnosis not present

## 2021-07-26 DIAGNOSIS — D631 Anemia in chronic kidney disease: Secondary | ICD-10-CM | POA: Diagnosis not present

## 2021-07-26 DIAGNOSIS — D509 Iron deficiency anemia, unspecified: Secondary | ICD-10-CM | POA: Diagnosis not present

## 2021-07-26 DIAGNOSIS — N186 End stage renal disease: Secondary | ICD-10-CM | POA: Diagnosis not present

## 2021-08-06 ENCOUNTER — Ambulatory Visit: Payer: Medicare PPO | Admitting: Internal Medicine

## 2021-08-06 ENCOUNTER — Telehealth: Payer: Self-pay | Admitting: *Deleted

## 2021-08-06 ENCOUNTER — Encounter: Payer: Medicare HMO | Admitting: Internal Medicine

## 2021-08-06 ENCOUNTER — Other Ambulatory Visit: Payer: Self-pay | Admitting: Internal Medicine

## 2021-08-06 ENCOUNTER — Other Ambulatory Visit: Payer: Self-pay

## 2021-08-06 ENCOUNTER — Encounter: Payer: Self-pay | Admitting: Internal Medicine

## 2021-08-06 VITALS — BP 138/78 | HR 91 | Temp 97.6°F | Ht 66.0 in | Wt 239.2 lb

## 2021-08-06 DIAGNOSIS — E113599 Type 2 diabetes mellitus with proliferative diabetic retinopathy without macular edema, unspecified eye: Secondary | ICD-10-CM | POA: Diagnosis not present

## 2021-08-06 DIAGNOSIS — E1122 Type 2 diabetes mellitus with diabetic chronic kidney disease: Secondary | ICD-10-CM

## 2021-08-06 DIAGNOSIS — E2839 Other primary ovarian failure: Secondary | ICD-10-CM

## 2021-08-06 DIAGNOSIS — I120 Hypertensive chronic kidney disease with stage 5 chronic kidney disease or end stage renal disease: Secondary | ICD-10-CM | POA: Diagnosis not present

## 2021-08-06 DIAGNOSIS — I129 Hypertensive chronic kidney disease with stage 1 through stage 4 chronic kidney disease, or unspecified chronic kidney disease: Secondary | ICD-10-CM

## 2021-08-06 DIAGNOSIS — E6609 Other obesity due to excess calories: Secondary | ICD-10-CM

## 2021-08-06 DIAGNOSIS — Z6838 Body mass index (BMI) 38.0-38.9, adult: Secondary | ICD-10-CM

## 2021-08-06 DIAGNOSIS — Z992 Dependence on renal dialysis: Secondary | ICD-10-CM

## 2021-08-06 DIAGNOSIS — E66812 Obesity, class 2: Secondary | ICD-10-CM

## 2021-08-06 DIAGNOSIS — N2581 Secondary hyperparathyroidism of renal origin: Secondary | ICD-10-CM

## 2021-08-06 DIAGNOSIS — L03116 Cellulitis of left lower limb: Secondary | ICD-10-CM

## 2021-08-06 DIAGNOSIS — N186 End stage renal disease: Secondary | ICD-10-CM

## 2021-08-06 DIAGNOSIS — E039 Hypothyroidism, unspecified: Secondary | ICD-10-CM

## 2021-08-06 MED ORDER — CEFTRIAXONE SODIUM 500 MG IJ SOLR
500.0000 mg | Freq: Once | INTRAMUSCULAR | Status: AC
  Administered 2021-08-06: 500 mg via INTRAMUSCULAR

## 2021-08-06 MED ORDER — CEPHALEXIN 500 MG PO CAPS: 500.0000 mg | ORAL_CAPSULE | Freq: Two times a day (BID) | ORAL | 0 refills | Status: DC

## 2021-08-06 NOTE — Patient Instructions (Addendum)
Cellulitis, Adult Cellulitis is a skin infection. The infected area is usually warm, red, swollen, and tender. This condition occurs most often in the arms and lower legs. The infection can travel to the muscles, blood, and underlying tissue and become serious. It is very important to get treated for this condition. What are the causes? Cellulitis is caused by bacteria. The bacteria enter through a break in the skin, such as a cut, burn, insect bite, open sore, or crack. What increases the risk? This condition is more likely to occur in people who: Have a weak body defense system (immune system). Have open wounds on the skin, such as cuts, burns, bites, and scrapes. Bacteria can enter the body through these open wounds. Are older than 71 years of age. Have diabetes. Have a type of long-lasting (chronic) liver disease (cirrhosis) or kidney disease. Are obese. Have a skin condition such as: Itchy rash (eczema). Slow movement of blood in the veins (venous stasis). Fluid buildup below the skin (edema). Have had radiation therapy. Use IV drugs. What are the signs or symptoms? Symptoms of this condition include: Redness, streaking, or spotting on the skin. Swollen area of the skin. Tenderness or pain when an area of the skin is touched. Warm skin. A fever. Chills. Blisters. How is this diagnosed? This condition is diagnosed based on a medical history and physical exam. You may also have tests, including: Blood tests. Imaging tests. How is this treated? Treatment for this condition may include: Medicines, such as antibiotic medicines or medicines to treat allergies (antihistamines). Supportive care, such as rest and application of cold or warm cloths (compresses) to the skin. Hospital care, if the condition is severe. The infection usually starts to get better within 1-2 days of treatment. Follow these instructions at home: Medicines Take over-the-counter and prescription medicines  only as told by your health care provider. If you were prescribed an antibiotic medicine, take it as told by your health care provider. Do not stop taking the antibiotic even if you start to feel better. General instructions Drink enough fluid to keep your urine pale yellow. Do not touch or rub the infected area. Raise (elevate) the infected area above the level of your heart while you are sitting or lying down. Apply warm or cold compresses to the affected area as told by your health care provider. Keep all follow-up visits as told by your health care provider. This is important. These visits let your health care provider make sure a more serious infection is not developing. Contact a health care provider if: You have a fever. Your symptoms do not begin to improve within 1-2 days of starting treatment. Your bone or joint underneath the infected area becomes painful after the skin has healed. Your infection returns in the same area or another area. You notice a swollen bump in the infected area. You develop new symptoms. You have a general ill feeling (malaise) with muscle aches and pains. Get help right away if: Your symptoms get worse. You feel very sleepy. You develop vomiting or diarrhea that persists. You notice red streaks coming from the infected area. Your red area gets larger or turns dark in color. These symptoms may represent a serious problem that is an emergency. Do not wait to see if the symptoms will go away. Get medical help right away. Call your local emergency services (911 in the [place] Do not drive yourself to the hospital. Summary Cellulitis is a skin infection. This condition occurs most often in  the arms and lower legs. Treatment for this condition may include medicines, such as antibiotic medicines or antihistamines. Take over-the-counter and prescription medicines only as told by your health care provider. If you were prescribed an antibiotic medicine, do not stop  taking the antibiotic even if you start to feel better. Contact a health care provider if your symptoms do not begin to improve within 1-2 days of starting treatment or your symptoms get worse. Keep all follow-up visits as told by your health care provider. This is important. These visits let your health care provider make sure that a more serious infection is not developing. This information is not intended to replace advice given to you by your health care provider. Make sure you discuss any questions you have with your health care provider. Document Revised: 07/25/2019 Document Reviewed: 12/03/2017 Elsevier Patient Education  2022 [place].   Hypertension, Adult Hypertension is another name for high blood pressure. High blood pressure forces your heart to work harder to pump blood. This can cause problems over time. There are two numbers in a blood pressure reading. There is a top number (systolic) over a bottom number (diastolic). It is best to have a blood pressure that is below 120/80. Healthy choices can help lower your blood pressure, or you may need medicine to help lower it. What are the causes? The cause of this condition is not known. Some conditions may be related to high blood pressure. What increases the risk? Smoking. Having type 2 diabetes mellitus, high cholesterol, or both. Not getting enough exercise or physical activity. Being overweight. Having too much fat, sugar, calories, or salt (sodium) in your diet. Drinking too much alcohol. Having long-term (chronic) kidney disease. Having a family history of high blood pressure. Age. Risk increases with age. Race. You may be at higher risk if you are African American. Gender. Men are at higher risk than women before age 61. After age 46, women are at higher risk than men. Having obstructive sleep apnea. Stress. What are the signs or symptoms? High blood pressure may not cause symptoms. Very high blood pressure  (hypertensive crisis) may cause: Headache. Feelings of worry or nervousness (anxiety). Shortness of breath. Nosebleed. A feeling of being sick to your stomach (nausea). Throwing up (vomiting). Changes in how you see. Very bad chest pain. Seizures. How is this treated? This condition is treated by making healthy lifestyle changes, such as: Eating healthy foods. Exercising more. Drinking less alcohol. Your health care provider may prescribe medicine if lifestyle changes are not enough to get your blood pressure under control, and if: Your top number is above 130. Your bottom number is above 80. Your personal target blood pressure may vary. Follow these instructions at home: Eating and drinking  If told, follow the DASH eating plan. To follow this plan: Fill one half of your plate at each meal with fruits and vegetables. Fill one fourth of your plate at each meal with whole grains. Whole grains include whole-wheat pasta, brown rice, and whole-grain bread. Eat or drink low-fat dairy products, such as skim milk or low-fat yogurt. Fill one fourth of your plate at each meal with low-fat (lean) proteins. Low-fat proteins include fish, chicken without skin, eggs, beans, and tofu. Avoid fatty meat, cured and processed meat, or chicken with skin. Avoid pre-made or processed food. Eat less than 1,500 mg of salt each day. Do not drink alcohol if: Your doctor tells you not to drink. You are pregnant, may be pregnant, or are  planning to become pregnant. If you drink alcohol: Limit how much you use to: 0-1 drink a day for women. 0-2 drinks a day for men. Be aware of how much alcohol is in your drink. In the [place] one drink equals one 12 oz bottle of beer (355 mL), one 5 oz glass of wine (148 mL), or one 1 oz glass of hard liquor (44 mL). Lifestyle  Work with your doctor to stay at a healthy weight or to lose weight. Ask your doctor what the best weight is for you. Get at least 30 minutes of  exercise most days of the week. This may include walking, swimming, or biking. Get at least 30 minutes of exercise that strengthens your muscles (resistance exercise) at least 3 days a week. This may include lifting weights or doing Pilates. Do not use any products that contain nicotine or tobacco, such as cigarettes, e-cigarettes, and chewing tobacco. If you need help quitting, ask your doctor. Check your blood pressure at home as told by your doctor. Keep all follow-up visits as told by your doctor. This is important. Medicines Take over-the-counter and prescription medicines only as told by your doctor. Follow directions carefully. Do not skip doses of blood pressure medicine. The medicine does not work as well if you skip doses. Skipping doses also puts you at risk for problems. Ask your doctor about side effects or reactions to medicines that you should watch for. Contact a doctor if you: Think you are having a reaction to the medicine you are taking. Have headaches that keep coming back (recurring). Feel dizzy. Have swelling in your ankles. Have trouble with your vision. Get help right away if you: Get a very bad headache. Start to feel mixed up (confused). Feel weak or numb. Feel faint. Have very bad pain in your: Chest. Belly (abdomen). Throw up more than once. Have trouble breathing. Summary Hypertension is another name for high blood pressure. High blood pressure forces your heart to work harder to pump blood. For most people, a normal blood pressure is less than 120/80. Making healthy choices can help lower blood pressure. If your blood pressure does not get lower with healthy choices, you may need to take medicine. This information is not intended to replace advice given to you by your health care provider. Make sure you discuss any questions you have with your health care provider. Document Revised: 03/24/2018 Document Reviewed: 03/24/2018 Elsevier Patient Education   [place].

## 2021-08-06 NOTE — Progress Notes (Signed)
Erroneous, rescheduled for later time today

## 2021-08-06 NOTE — Chronic Care Management (AMB) (Signed)
°  Care Management   Note  08/06/2021 Name: Jennifer Payne MRN: [id number] DOB: [birth year]  Jennifer Payne is a 71 y.o. year old female who is a primary care patient of Glendale Chard, MD and is actively engaged with the care management team. I reached out to [employer] by phone today to assist with re-scheduling a follow up visit with the RN Case Manager  Follow up plan: Unsuccessful telephone outreach attempt made. The care management team will reach out to the patient again over the next 7 days. If patient returns call to provider office, please advise to call [place] at [phone].  [place] [facility]  Direct Dial: [phone]

## 2021-08-06 NOTE — Progress Notes (Signed)
I,Victoria T Hamilton,acting as a scribe for Maximino Greenland, MD.,have documented all relevant documentation on the behalf of Maximino Greenland, MD,as directed by  Maximino Greenland, MD while in the presence of Maximino Greenland, MD.  This visit occurred during the SARS-CoV-2 public health emergency.  Safety protocols were in place, including screening questions prior to the visit, additional usage of staff PPE, and extensive cleaning of exam room while observing appropriate contact time as indicated for disinfecting solutions.  Subjective:     Patient ID: Jennifer Payne , female    DOB: 08/27/1950 , 71 y.o.   MRN: [id number]   Chief Complaint  Patient presents with   Diabetes   Hypertension   Hypothyroidism    HPI  Pt presents today for DM & HTN f/u. She is due for a foot exam, pt agreed to do today. She reports compliance with meds. States her sugars are "controlled".  She denies headaches, chest pain and shortness of breath.   Diabetes She presents for her follow-up diabetic visit. She has type 2 diabetes mellitus. Her disease course has been stable. There are no hypoglycemic associated symptoms. Pertinent negatives for diabetes include no blurred vision and no chest pain. There are no hypoglycemic complications. Diabetic complications include nephropathy and retinopathy. Risk factors for coronary artery disease include diabetes mellitus, dyslipidemia, hypertension, obesity, sedentary lifestyle and post-menopausal. She is following a diabetic diet. She participates in exercise intermittently. An ACE inhibitor/angiotensin II receptor blocker is contraindicated.  Hypertension This is a chronic problem. The current episode started more than 1 year ago. The problem has been gradually improving since onset. The problem is controlled. Pertinent negatives include no blurred vision, chest pain, palpitations or shortness of breath. Past treatments include angiotensin blockers, ACE inhibitors and  diuretics. The current treatment provides moderate improvement. Compliance problems include exercise.  Hypertensive end-organ damage includes kidney disease and retinopathy.    Past Medical History:  Diagnosis Date   Cataract    Diabetes mellitus    Hyperlipidemia    Hypertension    Thyroid disease      Family History  Problem Relation Age of Onset   Alzheimer's disease Mother    Coronary artery disease Mother    Hyperlipidemia Mother    Obesity Mother    Hypertension Father    Colon cancer Neg Hx      Current Outpatient Medications:    calcium acetate (PHOSLO) 667 MG tablet, TAKE 1 TABLET BY MOUTH ONCE DAILY WITH MEALS, Disp: , Rfl:    cephALEXin (KEFLEX) 500 MG capsule, Take 1 capsule (500 mg total) by mouth every 12 (twelve) hours., Disp: 14 capsule, Rfl: 0   levothyroxine (SYNTHROID) 88 MCG tablet, Take 1 tablet by mouth once daily, Disp: 90 tablet, Rfl: 0   sevelamer carbonate (RENVELA) 800 MG tablet, TAKE 3 TABLETS BY MOUTH WITH MEALS, Disp: , Rfl:    atorvastatin (LIPITOR) 80 MG tablet, Take 1 tablet by mouth once daily (Patient not taking: Reported on 08/06/2021), Disp: 90 tablet, Rfl: 0   Allergies  Allergen Reactions   Erythromycin Rash     Review of Systems  Constitutional: Negative.   Eyes:  Negative for blurred vision.  Respiratory: Negative.  Negative for shortness of breath.   Cardiovascular: Negative.  Negative for chest pain and palpitations.  Neurological: Negative.   Psychiatric/Behavioral: Negative.      Today's Vitals   08/06/21 1154  BP: 138/78  Pulse: 91  Temp: 97.6 F (36.4 C)  Weight:  239 lb 3.2 oz (108.5 kg)  Height: $Remove'5\' 6"'UabbTwT$  (1.676 m)   Body mass index is 38.61 kg/m.  Wt Readings from Last 3 Encounters:  08/06/21 239 lb 3.2 oz (108.5 kg)  06/19/21 228 lb (103.4 kg)  01/10/21 232 lb 12.8 oz (105.6 kg)    Objective:  Physical Exam Vitals and nursing note reviewed.  Constitutional:      Appearance: Normal appearance. She is obese.   HENT:     Head: Normocephalic and atraumatic.     Nose:     Comments: Masked     Mouth/Throat:     Comments: Masked  Eyes:     Extraocular Movements: Extraocular movements intact.  Cardiovascular:     Rate and Rhythm: Normal rate and regular rhythm.     Pulses:          Dorsalis pedis pulses are 1+ on the right side and 1+ on the left side.     Heart sounds: Normal heart sounds.  Pulmonary:     Effort: Pulmonary effort is normal.     Breath sounds: Normal breath sounds.  Abdominal:     General: Bowel sounds are normal.     Palpations: Abdomen is soft.  Musculoskeletal:     Cervical back: Normal range of motion.     Right lower leg: Edema present.     Left lower leg: Edema present.     Comments: Left lower extremity swelling, LLE is erythematous and warm to touch, no vesicular lesions noted  LUE AVF nontender with palpable thrill  Feet:     Right foot:     Protective Sensation: 5 sites tested.  3 sites sensed.     Skin integrity: Dry skin present.     Toenail Condition: Right toenails are abnormally thick.     Left foot:     Protective Sensation: 5 sites tested.  3 sites sensed.     Skin integrity: Callus and dry skin present.     Toenail Condition: Left toenails are abnormally thick.  Skin:    General: Skin is warm.  Neurological:     General: No focal deficit present.     Mental Status: She is alert.  Psychiatric:        Mood and Affect: Mood normal.        Behavior: Behavior normal.     Assessment And Plan:     1. 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 I will adjust meds as needed. Importance of dietary/medication/ofc visit compliance was d/w patient.  - CMP14+EGFR - Hemoglobin A1c - CBC no Diff - Lipid panel - Protein electrophoresis, serum  2. Hypertensive nephropathy Comments: Chronic, fair control. Goal BP <130/80.  She is encouraged to follow low sodium diet. No med changes today.  Reminded to cut out processed meats.  - CBC no Diff - Lipid panel  3. Cellulitis of left lower extremity Comments: She was given Rocephin, $RemoveBeforeD'500mg'DRMtUzGMISXWCa$  IM x1. I will also send rx Keflex $RemoveB'500mg'LtBzJAUq$  q12h x 7 days. She is encouraged to take full course of abx. - cefTRIAXone (ROCEPHIN) injection 500 mg  4. Primary hypothyroidism Comments: I will check thyroid panel and adjust meds as needed.  - TSH + free T4  5. Proliferative diabetic retinopathy associated with type 2 diabetes mellitus, unspecified laterality, unspecified proliferative retinopathy type ([place]) Comments: Chronic, encouraged to keep with regular eye doctor appointments.   6. Secondary hyperparathyroidism ([place]) Comments: Chronic,  as per Renal. I will check levels at her next visit.   7. Estrogen deficiency Comments: She agrees to bone density. I will refer her and make further recommendations once her results are available for review.  - DG Bone Density; Future  8. Class 2 obesity due to excess calories without serious comorbidity with body mass index (BMI) of 38.0 to 38.9 in adult She is encouraged to strive for BMI less than 30 to decrease cardiac risk. Advised to aim for at least 150 minutes of exercise per week.    Patient was given opportunity to ask questions. Patient verbalized understanding of the plan and was able to repeat key elements of the plan. All questions were answered to their satisfaction.   I, Maximino Greenland, MD, have reviewed all documentation for this visit. The documentation on 08/06/21 for the exam, diagnosis, procedures, and orders are all accurate and complete.   IF YOU HAVE BEEN REFERRED TO A SPECIALIST, IT MAY TAKE 1-2 WEEKS TO SCHEDULE/PROCESS THE REFERRAL. IF YOU HAVE NOT HEARD FROM US/SPECIALIST IN TWO WEEKS, PLEASE GIVE [place] A CALL AT [phone].   THE PATIENT IS ENCOURAGED TO PRACTICE SOCIAL DISTANCING DUE TO THE COVID-19 PANDEMIC.

## 2021-08-06 NOTE — Patient Instructions (Signed)
Type 2 Diabetes Mellitus, Diagnosis, Adult °Type 2 diabetes (type 2 diabetes mellitus) is a long-term (chronic) disease. It may happen when there is one or both of these problems: °The pancreas does not make enough insulin. °The body does not react in a normal way to insulin that it makes. °Insulin lets sugars go into cells in your body. If you have type 2 diabetes, sugars cannot get into your cells. Sugars build up in the blood. This causes high blood sugar. °What are the causes? °The exact cause of this condition is not known. °What increases the risk? °Having type 2 diabetes in your family. °Being overweight or very overweight. °Not being active. °Your body not reacting in a normal way to the insulin it makes. °Having higher than normal blood sugar over time. °Having a type of diabetes when you were pregnant. °Having a condition that causes small fluid-filled sacs on your ovaries. °What are the signs or symptoms? °At first, you may have no symptoms. You will get symptoms slowly. They may include: °More thirst than normal. °More hunger than normal. °Needing to pee more than normal. °Losing weight without trying. °Feeling tired. °Feeling weak. °Seeing things blurry. °Dark patches on your skin. °How is this treated? °This condition may be treated by a diabetes expert. You may need to: °Follow an eating plan made by a food expert (dietitian). °Get regular exercise. °Find ways to deal with stress. °Check blood sugar as often as told. °Take medicines. °Your doctor will set treatment goals for you. Your blood sugar should be at these levels: °Before meals: 80-130 mg/dL (4.4-7.2 mmol/L). °After meals: below 180 mg/dL (10 mmol/L). °Over the last 2-3 months: less than 7%. °Follow these instructions at home: °Medicines °Take your diabetes medicines or insulin every day. °Take medicines as told to help you prevent other problems caused by this condition. You may need: °Aspirin. °Medicine to lower cholesterol. °Medicine to  control blood pressure. °Questions to ask your doctor °Should I meet with a diabetes educator? °What medicines do I need, and when should I take them? °What will I need to treat my condition at home? °When should I check my blood sugar? °Where can I find a support group? °Who can I call if I have questions? °When is my next doctor visit? °General instructions °Take over-the-counter and prescription medicines only as told by your doctor. °Keep all follow-up visits. °Where to find more information °For help and guidance and more information about diabetes, please go to: °American Diabetes Association (ADA): www.diabetes.org °American Association of Diabetes Care and Education Specialists (ADCES): www.diabeteseducator.org °International Diabetes Federation (IDF): www.idf.org °Contact a doctor if: °Your blood sugar is at or above 240 mg/dL (13.3 mmol/L) for 2 days in a row. °You have been sick for 2 days or more, and you are not getting better. °You have had a fever for 2 days or more, and you are not getting better. °You have any of these problems for more than 6 hours: °You cannot eat or drink. °You feel like you may vomit. °You vomit. °You have watery poop (diarrhea). °Get help right away if: °Your blood sugar is lower than 54 mg/dL (3 mmol/L). °You feel mixed up (confused). °You have trouble thinking clearly. °You have trouble breathing. °You have medium or large ketone levels in your pee. °These symptoms may be an emergency. Get help right away. Call your local emergency services (911 in the [place] °Do not wait to see if the symptoms will go away. °Do not drive yourself   to the hospital. °Summary °Type 2 diabetes is a long-term disease. Your pancreas may not make enough insulin, or your body may not react in a normal way to insulin that it makes. °This condition is treated with an eating plan, lifestyle changes, and medicines. °Your doctor will set treatment goals for you. These will help you keep your blood sugar  in a healthy range. °Keep all follow-up visits. °This information is not intended to replace advice given to you by your health care provider. Make sure you discuss any questions you have with your health care provider. °Document Revised: 10/08/2020 Document Reviewed: 10/08/2020 °Elsevier Patient Education © 2022 Elsevier Inc. ° °

## 2021-08-08 ENCOUNTER — Telehealth: Payer: BC Managed Care – PPO

## 2021-08-08 LAB — CBC
Hematocrit: 37.2 % (ref 34.0–46.6)
Hemoglobin: 12.4 g/dL (ref 11.1–15.9)
MCH: 29.5 pg (ref 26.6–33.0)
MCHC: 33.3 g/dL (ref 31.5–35.7)
MCV: 89 fL (ref 79–97)
Platelets: 242 10*3/uL (ref 150–450)
RBC: 4.2 x10E6/uL (ref 3.77–5.28)
RDW: 12.5 % (ref 11.7–15.4)
WBC: 6.8 10*3/uL (ref 3.4–10.8)

## 2021-08-08 LAB — CMP14+EGFR
ALT: 10 IU/L (ref 0–32)
AST: 13 IU/L (ref 0–40)
Albumin/Globulin Ratio: 1.1 — ABNORMAL LOW (ref 1.2–2.2)
Albumin: 4 g/dL (ref 3.7–4.7)
Alkaline Phosphatase: 120 IU/L (ref 44–121)
BUN/Creatinine Ratio: 4 — ABNORMAL LOW (ref 12–28)
BUN: 37 mg/dL — ABNORMAL HIGH (ref 8–27)
Bilirubin Total: 0.2 mg/dL (ref 0.0–1.2)
CO2: 28 mmol/L (ref 20–29)
Calcium: 9 mg/dL (ref 8.7–10.3)
Chloride: 95 mmol/L — ABNORMAL LOW (ref 96–106)
Creatinine, Ser: 8.27 mg/dL — ABNORMAL HIGH (ref 0.57–1.00)
Globulin, Total: 3.8 g/dL (ref 1.5–4.5)
Glucose: 125 mg/dL — ABNORMAL HIGH (ref 70–99)
Potassium: 5.9 mmol/L — ABNORMAL HIGH (ref 3.5–5.2)
Sodium: 139 mmol/L (ref 134–144)
Total Protein: 7.8 g/dL (ref 6.0–8.5)
eGFR: 5 mL/min/{1.73_m2} — ABNORMAL LOW (ref >59)

## 2021-08-08 LAB — HEMOGLOBIN A1C
Est. average glucose Bld gHb Est-mCnc: 146 mg/dL
Hgb A1c MFr Bld: 6.7 % — ABNORMAL HIGH (ref 4.8–5.6)

## 2021-08-08 LAB — PROTEIN ELECTROPHORESIS, SERUM
A/G Ratio: 0.8 (ref 0.7–1.7)
Albumin ELP: 3.5 g/dL (ref 2.9–4.4)
Alpha 1: 0.3 g/dL (ref 0.0–0.4)
Alpha 2: 0.8 g/dL (ref 0.4–1.0)
Beta: 1 g/dL (ref 0.7–1.3)
Gamma Globulin: 2.3 g/dL — ABNORMAL HIGH (ref 0.4–1.8)
Globulin, Total: 4.3 g/dL — ABNORMAL HIGH (ref 2.2–3.9)

## 2021-08-08 LAB — TSH+FREE T4
Free T4: 1.49 ng/dL (ref 0.82–1.77)
TSH: 3.12 u[IU]/mL (ref 0.450–4.500)

## 2021-08-08 LAB — LIPID PANEL
Chol/HDL Ratio: 3.6 ratio (ref 0.0–4.4)
Cholesterol, Total: 206 mg/dL — ABNORMAL HIGH (ref 100–199)
HDL: 58 mg/dL (ref >39)
LDL Chol Calc (NIH): 133 mg/dL — ABNORMAL HIGH (ref 0–99)
Triglycerides: 85 mg/dL (ref 0–149)
VLDL Cholesterol Cal: 15 mg/dL (ref 5–40)

## 2021-08-13 NOTE — Chronic Care Management (AMB) (Signed)
°  Care Management   Note  08/13/2021 Name: Jennifer Payne MRN: [id number] DOB: 1951/07/20  [name] is a 71 y.o. year old female who is a primary care patient of Glendale Chard, MD and is actively engaged with the care management team. I reached out to [employer] by phone today to assist with re-scheduling a follow up visit with the RN Case Manager  Follow up plan: Telephone appointment with care management team member scheduled for:09/05/21  [place] [place] [facility]  Direct Dial: [phone]

## 2021-09-05 ENCOUNTER — Telehealth: Payer: Self-pay

## 2021-09-05 ENCOUNTER — Ambulatory Visit (INDEPENDENT_AMBULATORY_CARE_PROVIDER_SITE_OTHER): Payer: Medicare PPO

## 2021-09-05 DIAGNOSIS — N186 End stage renal disease: Secondary | ICD-10-CM

## 2021-09-05 DIAGNOSIS — Z992 Dependence on renal dialysis: Secondary | ICD-10-CM

## 2021-09-05 DIAGNOSIS — E1122 Type 2 diabetes mellitus with diabetic chronic kidney disease: Secondary | ICD-10-CM

## 2021-09-05 DIAGNOSIS — I129 Hypertensive chronic kidney disease with stage 1 through stage 4 chronic kidney disease, or unspecified chronic kidney disease: Secondary | ICD-10-CM

## 2021-09-05 NOTE — Chronic Care Management (AMB) (Addendum)
Chronic Care Management   CCM RN Visit Note  09/05/2021 Name: Jennifer Payne MRN: [id number] DOB: [birth year]  Subjective: Jennifer Payne is a 71 y.o. year old female who is a primary care patient of Glendale Chard, MD. The care management team was consulted for assistance with disease management and care coordination needs.    Engaged with patient by telephone for follow up visit in response to provider referral for case management and/or care coordination services.   Consent to Services:  The patient was given information about Chronic Care Management services, agreed to services, and gave verbal consent prior to initiation of services.  Please see initial visit note for detailed documentation.   Patient agreed to services and verbal consent obtained.   Assessment: Review of patient past medical history, allergies, medications, health status, including review of consultants reports, laboratory and other test data, was performed as part of comprehensive evaluation and provision of chronic care management services.   SDOH (Social Determinants of Health) assessments and interventions performed:    CCM Care Plan  Allergies  Allergen Reactions   Erythromycin Rash    Outpatient Encounter Medications as of 09/05/2021  Medication Sig   atorvastatin (LIPITOR) 80 MG tablet Take 1 tablet by mouth once daily (Patient not taking: Reported on 08/06/2021)   calcium acetate (PHOSLO) 667 MG tablet TAKE 1 TABLET BY MOUTH ONCE DAILY WITH MEALS   cephALEXin (KEFLEX) 500 MG capsule Take 1 capsule (500 mg total) by mouth every 12 (twelve) hours.   levothyroxine (SYNTHROID) 88 MCG tablet Take 1 tablet by mouth once daily   sevelamer carbonate (RENVELA) 800 MG tablet TAKE 3 TABLETS BY MOUTH WITH MEALS   No facility-administered encounter medications on file as of 09/05/2021.    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 ([place]) 08/19/2018   Encounter for general adult medical examination without abnormal findings 01/14/2018    Conditions to be addressed/monitored: ESRD on dialysis, 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, Hypertensive nephropathy  Care Plan : RN Care Manager Plan of Care  Updates made by Lynne Logan, RN since 09/05/2021 12:00 AM     Problem: No plan of care established for mangement of chronic care states (ESRD on dialysis, 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, Hypertensive nephropathy)   Priority: High     Long-Range Goal: Development of plan of care for chronic disease management for ESRD on dialysis, 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, Hypertensive nephropathy   Start Date: 05/31/2021  Expected End Date: 04/30/2022  Recent Progress: On track  Priority: High  Note:   Current Barriers:  Knowledge Deficits related to plan of care for management of ESRD on dialysis, 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, Hypertensive nephropathy Chronic Disease Management support and education needs related to ESRD on dialysis, 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, Hypertensive nephropathy  RNCM Clinical Goal(s):  Patient will verbalize basic understanding of  ESRD on dialysis, Type 2 DM with hypertension and ESRD  on dialysis, Hypertensive nephropathy disease process and self health management plan   attend all scheduled medical appointments: patient will schedule AWV with PCP and DM follow up visit with PCP provider  demonstrate Improved health management independence   continue  to work with RN Care Manager to address care management and care coordination needs related to  ESRD on dialysis, 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   Interventions: 1:1 collaboration with primary care provider regarding development and update of comprehensive plan of care as evidenced by provider attestation and co-signature Inter-disciplinary care team collaboration (see longitudinal plan of care) Evaluation of current treatment plan related to  self management and patient's adherence to plan as established by provider  Hyperlipidemia Interventions:  (Status:  New goal.) Long Term Goal Evaluation of current treatment plan related to Hyperlipidemia self management and patient's adherence to plan as established by provider   Medication review performed; medication list updated in electronic medical record, instructed patient of Dr. Baird Cancer recommendation to resume Atorvastatin 80 mg qd, patient verbalizes understanding and will resume this therapy as directed Provider established cholesterol goals reviewed Counseled on importance of regular laboratory monitoring as prescribed Provided HLD educational materials Reviewed role and benefits of statin for ASCVD risk reduction Reviewed importance of limiting foods high in cholesterol Reviewed exercise goals and target of 150 minutes per week Discussed plans with patient for ongoing care management follow up and provided patient with direct contact information for care management team Lipid Panel     Component Value Date/Time   CHOL 206 (H) 08/06/2021 1232   TRIG 85 08/06/2021 1232   HDL 58 08/06/2021 1232   CHOLHDL 3.6 08/06/2021 1232   [place] 133 (H) 08/06/2021 1232   LABVLDL 15 08/06/2021 1232   Hypertension Interventions:  (Status:  Goal on track:  Yes.) Long Term  Goal Last practice recorded BP readings:  BP Readings from Last 3 Encounters:  08/06/21 138/78  01/10/21 122/80  05/03/20 130/64  Most recent eGFR/CrCl:  Lab Results  Component Value Date   EGFR 5 (L) 08/06/2021    No components found for: CRCL Evaluation of current treatment plan related to hypertension self management and patient's adherence to plan as established by provider Reviewed medications with patient and discussed importance of compliance Advised patient, providing education and rationale, to monitor blood pressure daily and record, calling PCP for findings outside established parameters Advised patient to discuss symptoms of high blood pressure with provider Provided education on prescribed diet low Sodium Discussed plans with patient for ongoing care management follow up and provided patient with direct contact information for care management team   Diabetes Interventions:  (Status:  Goal on track:  Yes.) Long Term Goal Assessed patient's understanding of A1c goal: <6.5% Provided education to patient about basic DM disease process Reviewed medications with patient and discussed importance of medication adherence Counseled on importance of regular laboratory monitoring as prescribed Advised patient, providing education and rationale, to check cbg daily before meals and record, calling PCP for findings outside established parameters Review of patient status, including review of consultants reports, relevant laboratory and other test results, and medications completed Discussed plans with patient for ongoing care management follow up and provided patient with direct contact information for care management team Lab Results  Component Value Date   HGBA1C 6.7 (H) 08/06/2021    End  Stage Renal Disease Interventions:  (Status:  New goal.) Long Term Goal Assessed the Patient understanding of End Stage Renal disease    Evaluation of current treatment plan related to End Stage  Renal disease self management and patient's adherence to plan as established by provider      Reviewed prescribed diet, patient is adhering to her prescribed renal diet as directed Engaged patient in proactive and ongoing discussion about goals of care and what matters most to them    Discussed and reviewed recent update to patient's renal transplant status, determined patient has been placed on the wait list and has been instructed accordingly Determined patient verbalizes understanding of next steps regarding her renal transplant status Discussed plans with patient for ongoing care management follow up and provided patient with direct contact information for care management team Last practice recorded BP readings:  BP Readings from Last 3 Encounters:  08/06/21 138/78  01/10/21 122/80  05/03/20 130/64  Most recent eGFR/CrCl:  Lab Results  Component Value Date   EGFR 5 (L) 08/06/2021    No components found for: CRCL  Patient Goals/Self-Care Activities: Take all medications as prescribed Attend all scheduled provider appointments Call pharmacy for medication refills 3-7 days in advance of running out of medications Perform all self care activities independently  Perform IADL's (shopping, preparing meals, housekeeping, managing finances) independently Call provider office for new concerns or questions  check feet daily for cuts, sores or redness manage portion size call doctor for signs and symptoms of high blood pressure take medications for blood pressure exactly as prescribed report new symptoms to your doctor  Follow Up Plan:  Telephone follow up appointment with care management team member scheduled for:  12/09/21      Plan:Telephone follow up appointment with care management team member scheduled for:  12/09/21  Barb Merino, RN, BSN, CCM Care Management Coordinator [place] [facility]  Direct Phone: [phone]

## 2021-09-05 NOTE — Patient Instructions (Signed)
Visit Information  Thank you for taking time to visit with me today. Please don't hesitate to contact me if I can be of assistance to you before our next scheduled telephone appointment.  Following are the goals we discussed today:  (Copy and paste patient goals from clinical care plan here)  Our next appointment is by telephone on 12/09/21 at 10:30 AM   Please call the care guide team at [phone] if you need to cancel or reschedule your appointment.   If you are experiencing a Mental Health or [place] or need someone to talk to, please call [phone]-TALK (toll free, 24 hour hotline)   Patient verbalizes understanding of instructions and care plan provided today and agrees to view in [place]. Active MyChart status confirmed with patient.    Barb Merino, RN, BSN, CCM Care Management Coordinator [place] [facility]  Direct Phone: [phone]

## 2021-09-16 ENCOUNTER — Other Ambulatory Visit: Payer: Self-pay | Admitting: Internal Medicine

## 2021-09-24 DIAGNOSIS — E785 Hyperlipidemia, unspecified: Secondary | ICD-10-CM

## 2021-09-24 DIAGNOSIS — E1122 Type 2 diabetes mellitus with diabetic chronic kidney disease: Secondary | ICD-10-CM | POA: Diagnosis not present

## 2021-09-24 DIAGNOSIS — Z992 Dependence on renal dialysis: Secondary | ICD-10-CM | POA: Diagnosis not present

## 2021-09-24 DIAGNOSIS — I120 Hypertensive chronic kidney disease with stage 5 chronic kidney disease or end stage renal disease: Secondary | ICD-10-CM | POA: Diagnosis not present

## 2021-09-24 DIAGNOSIS — N186 End stage renal disease: Secondary | ICD-10-CM

## 2021-09-25 DIAGNOSIS — E119 Type 2 diabetes mellitus without complications: Secondary | ICD-10-CM | POA: Diagnosis not present

## 2021-09-25 DIAGNOSIS — I10 Essential (primary) hypertension: Secondary | ICD-10-CM | POA: Diagnosis not present

## 2021-09-25 DIAGNOSIS — D631 Anemia in chronic kidney disease: Secondary | ICD-10-CM | POA: Diagnosis not present

## 2021-09-25 DIAGNOSIS — N2581 Secondary hyperparathyroidism of renal origin: Secondary | ICD-10-CM | POA: Diagnosis not present

## 2021-09-25 DIAGNOSIS — N186 End stage renal disease: Secondary | ICD-10-CM | POA: Diagnosis not present

## 2021-09-25 DIAGNOSIS — D509 Iron deficiency anemia, unspecified: Secondary | ICD-10-CM | POA: Diagnosis not present

## 2021-09-27 DIAGNOSIS — N186 End stage renal disease: Secondary | ICD-10-CM | POA: Diagnosis not present

## 2021-09-27 DIAGNOSIS — N2581 Secondary hyperparathyroidism of renal origin: Secondary | ICD-10-CM | POA: Diagnosis not present

## 2021-09-27 DIAGNOSIS — D509 Iron deficiency anemia, unspecified: Secondary | ICD-10-CM | POA: Diagnosis not present

## 2021-09-27 DIAGNOSIS — D631 Anemia in chronic kidney disease: Secondary | ICD-10-CM | POA: Diagnosis not present

## 2021-09-30 DIAGNOSIS — D631 Anemia in chronic kidney disease: Secondary | ICD-10-CM | POA: Diagnosis not present

## 2021-09-30 DIAGNOSIS — D509 Iron deficiency anemia, unspecified: Secondary | ICD-10-CM | POA: Diagnosis not present

## 2021-09-30 DIAGNOSIS — N2581 Secondary hyperparathyroidism of renal origin: Secondary | ICD-10-CM | POA: Diagnosis not present

## 2021-09-30 DIAGNOSIS — N186 End stage renal disease: Secondary | ICD-10-CM | POA: Diagnosis not present

## 2021-10-02 DIAGNOSIS — N186 End stage renal disease: Secondary | ICD-10-CM | POA: Diagnosis not present

## 2021-10-02 DIAGNOSIS — D509 Iron deficiency anemia, unspecified: Secondary | ICD-10-CM | POA: Diagnosis not present

## 2021-10-02 DIAGNOSIS — N2581 Secondary hyperparathyroidism of renal origin: Secondary | ICD-10-CM | POA: Diagnosis not present

## 2021-10-02 DIAGNOSIS — D631 Anemia in chronic kidney disease: Secondary | ICD-10-CM | POA: Diagnosis not present

## 2021-10-04 DIAGNOSIS — D631 Anemia in chronic kidney disease: Secondary | ICD-10-CM | POA: Diagnosis not present

## 2021-10-04 DIAGNOSIS — N186 End stage renal disease: Secondary | ICD-10-CM | POA: Diagnosis not present

## 2021-10-04 DIAGNOSIS — D509 Iron deficiency anemia, unspecified: Secondary | ICD-10-CM | POA: Diagnosis not present

## 2021-10-04 DIAGNOSIS — N2581 Secondary hyperparathyroidism of renal origin: Secondary | ICD-10-CM | POA: Diagnosis not present

## 2021-10-07 DIAGNOSIS — N2581 Secondary hyperparathyroidism of renal origin: Secondary | ICD-10-CM | POA: Diagnosis not present

## 2021-10-07 DIAGNOSIS — D509 Iron deficiency anemia, unspecified: Secondary | ICD-10-CM | POA: Diagnosis not present

## 2021-10-07 DIAGNOSIS — D631 Anemia in chronic kidney disease: Secondary | ICD-10-CM | POA: Diagnosis not present

## 2021-10-07 DIAGNOSIS — N186 End stage renal disease: Secondary | ICD-10-CM | POA: Diagnosis not present

## 2021-10-09 DIAGNOSIS — D631 Anemia in chronic kidney disease: Secondary | ICD-10-CM | POA: Diagnosis not present

## 2021-10-09 DIAGNOSIS — N2581 Secondary hyperparathyroidism of renal origin: Secondary | ICD-10-CM | POA: Diagnosis not present

## 2021-10-09 DIAGNOSIS — D509 Iron deficiency anemia, unspecified: Secondary | ICD-10-CM | POA: Diagnosis not present

## 2021-10-09 DIAGNOSIS — N186 End stage renal disease: Secondary | ICD-10-CM | POA: Diagnosis not present

## 2021-10-11 DIAGNOSIS — D509 Iron deficiency anemia, unspecified: Secondary | ICD-10-CM | POA: Diagnosis not present

## 2021-10-11 DIAGNOSIS — N186 End stage renal disease: Secondary | ICD-10-CM | POA: Diagnosis not present

## 2021-10-11 DIAGNOSIS — D631 Anemia in chronic kidney disease: Secondary | ICD-10-CM | POA: Diagnosis not present

## 2021-10-11 DIAGNOSIS — N2581 Secondary hyperparathyroidism of renal origin: Secondary | ICD-10-CM | POA: Diagnosis not present

## 2021-10-14 DIAGNOSIS — N186 End stage renal disease: Secondary | ICD-10-CM | POA: Diagnosis not present

## 2021-10-14 DIAGNOSIS — D631 Anemia in chronic kidney disease: Secondary | ICD-10-CM | POA: Diagnosis not present

## 2021-10-14 DIAGNOSIS — D509 Iron deficiency anemia, unspecified: Secondary | ICD-10-CM | POA: Diagnosis not present

## 2021-10-14 DIAGNOSIS — N2581 Secondary hyperparathyroidism of renal origin: Secondary | ICD-10-CM | POA: Diagnosis not present

## 2021-10-16 DIAGNOSIS — D509 Iron deficiency anemia, unspecified: Secondary | ICD-10-CM | POA: Diagnosis not present

## 2021-10-16 DIAGNOSIS — D631 Anemia in chronic kidney disease: Secondary | ICD-10-CM | POA: Diagnosis not present

## 2021-10-16 DIAGNOSIS — N186 End stage renal disease: Secondary | ICD-10-CM | POA: Diagnosis not present

## 2021-10-16 DIAGNOSIS — N2581 Secondary hyperparathyroidism of renal origin: Secondary | ICD-10-CM | POA: Diagnosis not present

## 2021-10-18 DIAGNOSIS — D509 Iron deficiency anemia, unspecified: Secondary | ICD-10-CM | POA: Diagnosis not present

## 2021-10-18 DIAGNOSIS — N2581 Secondary hyperparathyroidism of renal origin: Secondary | ICD-10-CM | POA: Diagnosis not present

## 2021-10-18 DIAGNOSIS — N186 End stage renal disease: Secondary | ICD-10-CM | POA: Diagnosis not present

## 2021-10-18 DIAGNOSIS — D631 Anemia in chronic kidney disease: Secondary | ICD-10-CM | POA: Diagnosis not present

## 2021-10-21 DIAGNOSIS — N2581 Secondary hyperparathyroidism of renal origin: Secondary | ICD-10-CM | POA: Diagnosis not present

## 2021-10-21 DIAGNOSIS — D509 Iron deficiency anemia, unspecified: Secondary | ICD-10-CM | POA: Diagnosis not present

## 2021-10-21 DIAGNOSIS — N186 End stage renal disease: Secondary | ICD-10-CM | POA: Diagnosis not present

## 2021-10-21 DIAGNOSIS — D631 Anemia in chronic kidney disease: Secondary | ICD-10-CM | POA: Diagnosis not present

## 2021-10-23 DIAGNOSIS — N2581 Secondary hyperparathyroidism of renal origin: Secondary | ICD-10-CM | POA: Diagnosis not present

## 2021-10-23 DIAGNOSIS — N186 End stage renal disease: Secondary | ICD-10-CM | POA: Diagnosis not present

## 2021-10-23 DIAGNOSIS — D509 Iron deficiency anemia, unspecified: Secondary | ICD-10-CM | POA: Diagnosis not present

## 2021-10-23 DIAGNOSIS — D631 Anemia in chronic kidney disease: Secondary | ICD-10-CM | POA: Diagnosis not present

## 2021-10-25 DIAGNOSIS — N186 End stage renal disease: Secondary | ICD-10-CM | POA: Diagnosis not present

## 2021-10-25 DIAGNOSIS — D631 Anemia in chronic kidney disease: Secondary | ICD-10-CM | POA: Diagnosis not present

## 2021-10-25 DIAGNOSIS — N2581 Secondary hyperparathyroidism of renal origin: Secondary | ICD-10-CM | POA: Diagnosis not present

## 2021-10-25 DIAGNOSIS — D509 Iron deficiency anemia, unspecified: Secondary | ICD-10-CM | POA: Diagnosis not present

## 2021-10-26 DIAGNOSIS — D631 Anemia in chronic kidney disease: Secondary | ICD-10-CM | POA: Diagnosis not present

## 2021-10-26 DIAGNOSIS — I10 Essential (primary) hypertension: Secondary | ICD-10-CM | POA: Diagnosis not present

## 2021-10-26 DIAGNOSIS — N186 End stage renal disease: Secondary | ICD-10-CM | POA: Diagnosis not present

## 2021-10-28 DIAGNOSIS — D631 Anemia in chronic kidney disease: Secondary | ICD-10-CM | POA: Diagnosis not present

## 2021-10-28 DIAGNOSIS — D509 Iron deficiency anemia, unspecified: Secondary | ICD-10-CM | POA: Diagnosis not present

## 2021-10-28 DIAGNOSIS — N186 End stage renal disease: Secondary | ICD-10-CM | POA: Diagnosis not present

## 2021-10-28 DIAGNOSIS — N2581 Secondary hyperparathyroidism of renal origin: Secondary | ICD-10-CM | POA: Diagnosis not present

## 2021-10-30 DIAGNOSIS — N2581 Secondary hyperparathyroidism of renal origin: Secondary | ICD-10-CM | POA: Diagnosis not present

## 2021-10-30 DIAGNOSIS — E119 Type 2 diabetes mellitus without complications: Secondary | ICD-10-CM | POA: Diagnosis not present

## 2021-10-30 DIAGNOSIS — D509 Iron deficiency anemia, unspecified: Secondary | ICD-10-CM | POA: Diagnosis not present

## 2021-10-30 DIAGNOSIS — N186 End stage renal disease: Secondary | ICD-10-CM | POA: Diagnosis not present

## 2021-10-30 DIAGNOSIS — D631 Anemia in chronic kidney disease: Secondary | ICD-10-CM | POA: Diagnosis not present

## 2021-10-31 DIAGNOSIS — M25561 Pain in right knee: Secondary | ICD-10-CM | POA: Diagnosis not present

## 2021-10-31 DIAGNOSIS — M79661 Pain in right lower leg: Secondary | ICD-10-CM | POA: Diagnosis not present

## 2021-11-01 ENCOUNTER — Other Ambulatory Visit: Payer: Self-pay | Admitting: Medical

## 2021-11-01 DIAGNOSIS — D509 Iron deficiency anemia, unspecified: Secondary | ICD-10-CM | POA: Diagnosis not present

## 2021-11-01 DIAGNOSIS — N2581 Secondary hyperparathyroidism of renal origin: Secondary | ICD-10-CM | POA: Diagnosis not present

## 2021-11-01 DIAGNOSIS — D631 Anemia in chronic kidney disease: Secondary | ICD-10-CM | POA: Diagnosis not present

## 2021-11-01 DIAGNOSIS — M25561 Pain in right knee: Secondary | ICD-10-CM

## 2021-11-01 DIAGNOSIS — N186 End stage renal disease: Secondary | ICD-10-CM | POA: Diagnosis not present

## 2021-11-04 DIAGNOSIS — Z992 Dependence on renal dialysis: Secondary | ICD-10-CM | POA: Diagnosis not present

## 2021-11-04 DIAGNOSIS — T82858A Stenosis of vascular prosthetic devices, implants and grafts, initial encounter: Secondary | ICD-10-CM | POA: Diagnosis not present

## 2021-11-04 DIAGNOSIS — T82590A Other mechanical complication of surgically created arteriovenous fistula, initial encounter: Secondary | ICD-10-CM | POA: Diagnosis not present

## 2021-11-04 DIAGNOSIS — T8249XA Other complication of vascular dialysis catheter, initial encounter: Secondary | ICD-10-CM | POA: Diagnosis not present

## 2021-11-04 DIAGNOSIS — N186 End stage renal disease: Secondary | ICD-10-CM | POA: Diagnosis not present

## 2021-11-05 DIAGNOSIS — D509 Iron deficiency anemia, unspecified: Secondary | ICD-10-CM | POA: Diagnosis not present

## 2021-11-05 DIAGNOSIS — N186 End stage renal disease: Secondary | ICD-10-CM | POA: Diagnosis not present

## 2021-11-05 DIAGNOSIS — N2581 Secondary hyperparathyroidism of renal origin: Secondary | ICD-10-CM | POA: Diagnosis not present

## 2021-11-05 DIAGNOSIS — D631 Anemia in chronic kidney disease: Secondary | ICD-10-CM | POA: Diagnosis not present

## 2021-11-06 DIAGNOSIS — N2581 Secondary hyperparathyroidism of renal origin: Secondary | ICD-10-CM | POA: Diagnosis not present

## 2021-11-06 DIAGNOSIS — N186 End stage renal disease: Secondary | ICD-10-CM | POA: Diagnosis not present

## 2021-11-06 DIAGNOSIS — D509 Iron deficiency anemia, unspecified: Secondary | ICD-10-CM | POA: Diagnosis not present

## 2021-11-06 DIAGNOSIS — D631 Anemia in chronic kidney disease: Secondary | ICD-10-CM | POA: Diagnosis not present

## 2021-11-07 DIAGNOSIS — H26491 Other secondary cataract, right eye: Secondary | ICD-10-CM | POA: Diagnosis not present

## 2021-11-07 DIAGNOSIS — E113511 Type 2 diabetes mellitus with proliferative diabetic retinopathy with macular edema, right eye: Secondary | ICD-10-CM | POA: Diagnosis not present

## 2021-11-07 DIAGNOSIS — H4311 Vitreous hemorrhage, right eye: Secondary | ICD-10-CM | POA: Diagnosis not present

## 2021-11-07 DIAGNOSIS — H35371 Puckering of macula, right eye: Secondary | ICD-10-CM | POA: Diagnosis not present

## 2021-11-08 DIAGNOSIS — N186 End stage renal disease: Secondary | ICD-10-CM | POA: Diagnosis not present

## 2021-11-08 DIAGNOSIS — D631 Anemia in chronic kidney disease: Secondary | ICD-10-CM | POA: Diagnosis not present

## 2021-11-08 DIAGNOSIS — N2581 Secondary hyperparathyroidism of renal origin: Secondary | ICD-10-CM | POA: Diagnosis not present

## 2021-11-08 DIAGNOSIS — D509 Iron deficiency anemia, unspecified: Secondary | ICD-10-CM | POA: Diagnosis not present

## 2021-11-11 DIAGNOSIS — D631 Anemia in chronic kidney disease: Secondary | ICD-10-CM | POA: Diagnosis not present

## 2021-11-11 DIAGNOSIS — D509 Iron deficiency anemia, unspecified: Secondary | ICD-10-CM | POA: Diagnosis not present

## 2021-11-11 DIAGNOSIS — N186 End stage renal disease: Secondary | ICD-10-CM | POA: Diagnosis not present

## 2021-11-11 DIAGNOSIS — N2581 Secondary hyperparathyroidism of renal origin: Secondary | ICD-10-CM | POA: Diagnosis not present

## 2021-11-13 DIAGNOSIS — D631 Anemia in chronic kidney disease: Secondary | ICD-10-CM | POA: Diagnosis not present

## 2021-11-13 DIAGNOSIS — D509 Iron deficiency anemia, unspecified: Secondary | ICD-10-CM | POA: Diagnosis not present

## 2021-11-13 DIAGNOSIS — N2581 Secondary hyperparathyroidism of renal origin: Secondary | ICD-10-CM | POA: Diagnosis not present

## 2021-11-13 DIAGNOSIS — N186 End stage renal disease: Secondary | ICD-10-CM | POA: Diagnosis not present

## 2021-11-15 DIAGNOSIS — N2581 Secondary hyperparathyroidism of renal origin: Secondary | ICD-10-CM | POA: Diagnosis not present

## 2021-11-15 DIAGNOSIS — D631 Anemia in chronic kidney disease: Secondary | ICD-10-CM | POA: Diagnosis not present

## 2021-11-15 DIAGNOSIS — N186 End stage renal disease: Secondary | ICD-10-CM | POA: Diagnosis not present

## 2021-11-15 DIAGNOSIS — D509 Iron deficiency anemia, unspecified: Secondary | ICD-10-CM | POA: Diagnosis not present

## 2021-11-15 DIAGNOSIS — E113511 Type 2 diabetes mellitus with proliferative diabetic retinopathy with macular edema, right eye: Secondary | ICD-10-CM | POA: Diagnosis not present

## 2021-11-18 DIAGNOSIS — D631 Anemia in chronic kidney disease: Secondary | ICD-10-CM | POA: Diagnosis not present

## 2021-11-18 DIAGNOSIS — D509 Iron deficiency anemia, unspecified: Secondary | ICD-10-CM | POA: Diagnosis not present

## 2021-11-18 DIAGNOSIS — N186 End stage renal disease: Secondary | ICD-10-CM | POA: Diagnosis not present

## 2021-11-18 DIAGNOSIS — N2581 Secondary hyperparathyroidism of renal origin: Secondary | ICD-10-CM | POA: Diagnosis not present

## 2021-11-20 DIAGNOSIS — D509 Iron deficiency anemia, unspecified: Secondary | ICD-10-CM | POA: Diagnosis not present

## 2021-11-20 DIAGNOSIS — N186 End stage renal disease: Secondary | ICD-10-CM | POA: Diagnosis not present

## 2021-11-20 DIAGNOSIS — N2581 Secondary hyperparathyroidism of renal origin: Secondary | ICD-10-CM | POA: Diagnosis not present

## 2021-11-20 DIAGNOSIS — D631 Anemia in chronic kidney disease: Secondary | ICD-10-CM | POA: Diagnosis not present

## 2021-11-22 DIAGNOSIS — N186 End stage renal disease: Secondary | ICD-10-CM | POA: Diagnosis not present

## 2021-11-22 DIAGNOSIS — D631 Anemia in chronic kidney disease: Secondary | ICD-10-CM | POA: Diagnosis not present

## 2021-11-22 DIAGNOSIS — D509 Iron deficiency anemia, unspecified: Secondary | ICD-10-CM | POA: Diagnosis not present

## 2021-11-22 DIAGNOSIS — N2581 Secondary hyperparathyroidism of renal origin: Secondary | ICD-10-CM | POA: Diagnosis not present

## 2021-11-25 DIAGNOSIS — N186 End stage renal disease: Secondary | ICD-10-CM | POA: Diagnosis not present

## 2021-11-25 DIAGNOSIS — D509 Iron deficiency anemia, unspecified: Secondary | ICD-10-CM | POA: Diagnosis not present

## 2021-11-25 DIAGNOSIS — N2581 Secondary hyperparathyroidism of renal origin: Secondary | ICD-10-CM | POA: Diagnosis not present

## 2021-11-25 DIAGNOSIS — I10 Essential (primary) hypertension: Secondary | ICD-10-CM | POA: Diagnosis not present

## 2021-11-25 DIAGNOSIS — D631 Anemia in chronic kidney disease: Secondary | ICD-10-CM | POA: Diagnosis not present

## 2021-11-27 DIAGNOSIS — D509 Iron deficiency anemia, unspecified: Secondary | ICD-10-CM | POA: Diagnosis not present

## 2021-11-27 DIAGNOSIS — N186 End stage renal disease: Secondary | ICD-10-CM | POA: Diagnosis not present

## 2021-11-27 DIAGNOSIS — N2581 Secondary hyperparathyroidism of renal origin: Secondary | ICD-10-CM | POA: Diagnosis not present

## 2021-11-27 DIAGNOSIS — E119 Type 2 diabetes mellitus without complications: Secondary | ICD-10-CM | POA: Diagnosis not present

## 2021-11-27 DIAGNOSIS — D631 Anemia in chronic kidney disease: Secondary | ICD-10-CM | POA: Diagnosis not present

## 2021-11-28 ENCOUNTER — Ambulatory Visit
Admission: RE | Admit: 2021-11-28 | Discharge: 2021-11-28 | Disposition: A | Discharge status: Home / Self Care | Payer: Medicare PPO | Source: Ambulatory Visit | Attending: Medical | Admitting: Medical

## 2021-11-28 DIAGNOSIS — M25561 Pain in right knee: Secondary | ICD-10-CM | POA: Diagnosis not present

## 2021-11-29 DIAGNOSIS — D509 Iron deficiency anemia, unspecified: Secondary | ICD-10-CM | POA: Diagnosis not present

## 2021-11-29 DIAGNOSIS — N2581 Secondary hyperparathyroidism of renal origin: Secondary | ICD-10-CM | POA: Diagnosis not present

## 2021-11-29 DIAGNOSIS — N186 End stage renal disease: Secondary | ICD-10-CM | POA: Diagnosis not present

## 2021-11-29 DIAGNOSIS — D631 Anemia in chronic kidney disease: Secondary | ICD-10-CM | POA: Diagnosis not present

## 2021-12-02 DIAGNOSIS — D509 Iron deficiency anemia, unspecified: Secondary | ICD-10-CM | POA: Diagnosis not present

## 2021-12-02 DIAGNOSIS — N186 End stage renal disease: Secondary | ICD-10-CM | POA: Diagnosis not present

## 2021-12-02 DIAGNOSIS — D631 Anemia in chronic kidney disease: Secondary | ICD-10-CM | POA: Diagnosis not present

## 2021-12-02 DIAGNOSIS — N2581 Secondary hyperparathyroidism of renal origin: Secondary | ICD-10-CM | POA: Diagnosis not present

## 2021-12-03 DIAGNOSIS — E113513 Type 2 diabetes mellitus with proliferative diabetic retinopathy with macular edema, bilateral: Secondary | ICD-10-CM | POA: Diagnosis not present

## 2021-12-04 DIAGNOSIS — N2581 Secondary hyperparathyroidism of renal origin: Secondary | ICD-10-CM | POA: Diagnosis not present

## 2021-12-04 DIAGNOSIS — D509 Iron deficiency anemia, unspecified: Secondary | ICD-10-CM | POA: Diagnosis not present

## 2021-12-04 DIAGNOSIS — D631 Anemia in chronic kidney disease: Secondary | ICD-10-CM | POA: Diagnosis not present

## 2021-12-04 DIAGNOSIS — N186 End stage renal disease: Secondary | ICD-10-CM | POA: Diagnosis not present

## 2021-12-05 ENCOUNTER — Ambulatory Visit (INDEPENDENT_AMBULATORY_CARE_PROVIDER_SITE_OTHER): Payer: Medicare PPO | Admitting: Internal Medicine

## 2021-12-05 ENCOUNTER — Encounter: Payer: Self-pay | Admitting: Internal Medicine

## 2021-12-05 VITALS — BP 130/72 | HR 87 | Temp 98.2°F | Ht 66.0 in | Wt 238.8 lb

## 2021-12-05 DIAGNOSIS — E1122 Type 2 diabetes mellitus with diabetic chronic kidney disease: Secondary | ICD-10-CM

## 2021-12-05 DIAGNOSIS — E6609 Other obesity due to excess calories: Secondary | ICD-10-CM | POA: Diagnosis not present

## 2021-12-05 DIAGNOSIS — I120 Hypertensive chronic kidney disease with stage 5 chronic kidney disease or end stage renal disease: Secondary | ICD-10-CM | POA: Diagnosis not present

## 2021-12-05 DIAGNOSIS — I129 Hypertensive chronic kidney disease with stage 1 through stage 4 chronic kidney disease, or unspecified chronic kidney disease: Secondary | ICD-10-CM | POA: Diagnosis not present

## 2021-12-05 DIAGNOSIS — N186 End stage renal disease: Secondary | ICD-10-CM

## 2021-12-05 DIAGNOSIS — E039 Hypothyroidism, unspecified: Secondary | ICD-10-CM

## 2021-12-05 DIAGNOSIS — Z6838 Body mass index (BMI) 38.0-38.9, adult: Secondary | ICD-10-CM | POA: Diagnosis not present

## 2021-12-05 DIAGNOSIS — M170 Bilateral primary osteoarthritis of knee: Secondary | ICD-10-CM

## 2021-12-05 DIAGNOSIS — Z992 Dependence on renal dialysis: Secondary | ICD-10-CM

## 2021-12-05 NOTE — Patient Instructions (Signed)

## 2021-12-05 NOTE — Progress Notes (Signed)
?Rich Brave [place],acting as a Education administrator for Maximino Greenland, MD.,have documented all relevant documentation on the behalf of Maximino Greenland, MD,as directed by  Maximino Greenland, MD while in the presence of Maximino Greenland, MD.  ?This visit occurred during the SARS-CoV-2 public health emergency.  Safety protocols were in place, including screening questions prior to the visit, additional usage of staff PPE, and extensive cleaning of exam room while observing appropriate contact time as indicated for disinfecting solutions. ? ?Subjective:  ?  ? Patient ID: Jennifer Payne , female    DOB: Nov 05, 1950 , 71 y.o.   MRN: [id number] ? ? ?Chief Complaint  ?Patient presents with  ? Hypertension  ? Diabetes  ? ? ?HPI ? ?Pt presents today for DM & HTN f/u. Patient reports compliance with her medications.  She denies headaches, chest pain and shortness of breath. She reports she would like to get a new cane and a handicap placard. She reports her knee pain has worsened, has h/o osteoarthritis. ? ?Diabetes ?She presents for her follow-up diabetic visit. She has type 2 diabetes mellitus. Her disease course has been stable. There are no hypoglycemic associated symptoms. Pertinent negatives for diabetes include no blurred vision and no chest pain. There are no hypoglycemic complications. Diabetic complications include nephropathy and retinopathy. Risk factors for coronary artery disease include diabetes mellitus, dyslipidemia, hypertension, obesity, sedentary lifestyle and post-menopausal. She is following a diabetic diet. She participates in exercise intermittently. An ACE inhibitor/angiotensin II receptor blocker is contraindicated.  ?Hypertension ?This is a chronic problem. The current episode started more than 1 year ago. The problem has been gradually improving since onset. The problem is controlled. Pertinent negatives include no blurred vision, chest pain, palpitations or shortness of breath. Past treatments include  angiotensin blockers, ACE inhibitors and diuretics. The current treatment provides moderate improvement. Compliance problems include exercise.  Hypertensive end-organ damage includes kidney disease and retinopathy.   ? ?Past Medical History:  ?Diagnosis Date  ? Cataract   ? Diabetes mellitus   ? Hyperlipidemia   ? Hypertension   ? Thyroid disease   ?  ? ?Family History  ?Problem Relation Age of Onset  ? Alzheimer's disease Mother   ? Coronary artery disease Mother   ? Hyperlipidemia Mother   ? Obesity Mother   ? Hypertension Father   ? Colon cancer Neg Hx   ? ? ? ?Current Outpatient Medications:  ?  atorvastatin (LIPITOR) 80 MG tablet, Take 1 tablet by mouth once daily, Disp: 90 tablet, Rfl: 0 ?  calcium acetate (PHOSLO) 667 MG tablet, TAKE 1 TABLET BY MOUTH ONCE DAILY WITH MEALS, Disp: , Rfl:  ?  levothyroxine (SYNTHROID) 88 MCG tablet, Take 1 tablet by mouth once daily, Disp: 90 tablet, Rfl: 0 ?  sevelamer carbonate (RENVELA) 800 MG tablet, TAKE 3 TABLETS BY MOUTH WITH MEALS, Disp: , Rfl:   ? ?Allergies  ?Allergen Reactions  ? Erythromycin Rash  ?  ? ?Review of Systems  ?Constitutional: Negative.   ?Eyes:  Negative for blurred vision.  ?Respiratory: Negative.  Negative for shortness of breath.   ?Cardiovascular: Negative.  Negative for chest pain and palpitations.  ?Gastrointestinal: Negative.   ?Musculoskeletal:  Positive for arthralgias.  ?Neurological: Negative.   ?Psychiatric/Behavioral: Negative.     ? ?Today's Vitals  ? 12/05/21 1425  ?BP: 130/72  ?Pulse: 87  ?Temp: 98.2 ?F (36.8 ?C)  ?Weight: 238 lb 12.8 oz (108.3 kg)  ?Height: '5\' 6"'$  (1.676 m)  ?PainSc: 0-No pain  ? ?  Body mass index is 38.54 kg/m?.  ?Wt Readings from Last 3 Encounters:  ?12/05/21 238 lb 12.8 oz (108.3 kg)  ?08/06/21 239 lb 3.2 oz (108.5 kg)  ?06/19/21 228 lb (103.4 kg)  ?  ? ?Objective:  ?Physical Exam  ? ?   ?Assessment And Plan:  ?   ?1. Hypertensive nephropathy ?Comments: Chronic, controlled.  She is not on any BP meds at this time,  controlled w/ diet. She is encouraged to follow low sodium diet. Will consider Bblocker if needed. ? ?2. 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 She denies family history of thyroid cancer.  ?- Hemoglobin A1c ? ?3. Primary osteoarthritis of both knees ?Comments: Handicapped sticker completed. I will also send rx four prong cane to DME.  ? ?4. Primary hypothyroidism ?Comments: I will check TSh today.  I will adjust meds as needed.  ?- TSH ? ?5. Class 2 obesity due to excess calories without serious comorbidity with body mass index (BMI) of 38.0 to 38.9 in adult ? She is encouraged to strive for BMI less than 30 to decrease cardiac risk. Advised to aim for at least 150 minutes of exercise per week.  ? ?Patient was given opportunity to ask questions. Patient verbalized understanding of the plan and was able to repeat key elements of the plan. All questions were answered to their satisfaction.  ? ?I, Maximino Greenland, MD, have reviewed all documentation for this visit. The documentation on 12/05/21 for the exam, diagnosis, procedures, and orders are all accurate and complete.  ? ?IF YOU HAVE BEEN REFERRED TO A SPECIALIST, IT MAY TAKE 1-2 WEEKS TO SCHEDULE/PROCESS THE REFERRAL. IF YOU HAVE NOT HEARD FROM US/SPECIALIST IN TWO WEEKS, PLEASE GIVE [place] A CALL AT [phone].  ? ?THE PATIENT IS ENCOURAGED TO PRACTICE SOCIAL DISTANCING DUE TO THE COVID-19 PANDEMIC.   ?

## 2021-12-06 DIAGNOSIS — N186 End stage renal disease: Secondary | ICD-10-CM | POA: Diagnosis not present

## 2021-12-06 DIAGNOSIS — N2581 Secondary hyperparathyroidism of renal origin: Secondary | ICD-10-CM | POA: Diagnosis not present

## 2021-12-06 DIAGNOSIS — D631 Anemia in chronic kidney disease: Secondary | ICD-10-CM | POA: Diagnosis not present

## 2021-12-06 DIAGNOSIS — D509 Iron deficiency anemia, unspecified: Secondary | ICD-10-CM | POA: Diagnosis not present

## 2021-12-06 LAB — TSH: TSH: 1.7 u[IU]/mL (ref 0.450–4.500)

## 2021-12-06 LAB — HEMOGLOBIN A1C
Est. average glucose Bld gHb Est-mCnc: 151 mg/dL
Hgb A1c MFr Bld: 6.9 % — ABNORMAL HIGH (ref 4.8–5.6)

## 2021-12-09 ENCOUNTER — Telehealth: Payer: Self-pay

## 2021-12-09 ENCOUNTER — Telehealth: Payer: Medicare PPO

## 2021-12-09 DIAGNOSIS — D509 Iron deficiency anemia, unspecified: Secondary | ICD-10-CM | POA: Diagnosis not present

## 2021-12-09 DIAGNOSIS — D631 Anemia in chronic kidney disease: Secondary | ICD-10-CM | POA: Diagnosis not present

## 2021-12-09 DIAGNOSIS — N186 End stage renal disease: Secondary | ICD-10-CM | POA: Diagnosis not present

## 2021-12-09 DIAGNOSIS — N2581 Secondary hyperparathyroidism of renal origin: Secondary | ICD-10-CM | POA: Diagnosis not present

## 2021-12-09 NOTE — Telephone Encounter (Signed)
?  Care Management  ? ?Follow Up Note ? ? ?12/09/2021 ?Name: Jennifer Payne MRN: [id number] DOB: 05-22-1951 ? ? ?Referred by: Glendale Chard, MD ?Reason for referral : Chronic Care Management (RN CM Follow up call ) ? ? ?An unsuccessful telephone outreach was attempted today. The patient was referred to the case management team for assistance with care management and care coordination.  ? ?Follow Up Plan: Telephone follow up appointment with care management team member scheduled for: 12/26/21 ? ? ?Barb Merino, RN, BSN, CCM ?Care Management Coordinator ?[place] [facility]  ?Direct Phone: [phone] ? ? ?

## 2021-12-10 ENCOUNTER — Other Ambulatory Visit: Payer: Self-pay | Admitting: Internal Medicine

## 2021-12-11 DIAGNOSIS — N186 End stage renal disease: Secondary | ICD-10-CM | POA: Diagnosis not present

## 2021-12-11 DIAGNOSIS — D509 Iron deficiency anemia, unspecified: Secondary | ICD-10-CM | POA: Diagnosis not present

## 2021-12-11 DIAGNOSIS — D631 Anemia in chronic kidney disease: Secondary | ICD-10-CM | POA: Diagnosis not present

## 2021-12-11 DIAGNOSIS — N2581 Secondary hyperparathyroidism of renal origin: Secondary | ICD-10-CM | POA: Diagnosis not present

## 2021-12-13 DIAGNOSIS — N2581 Secondary hyperparathyroidism of renal origin: Secondary | ICD-10-CM | POA: Diagnosis not present

## 2021-12-13 DIAGNOSIS — N186 End stage renal disease: Secondary | ICD-10-CM | POA: Diagnosis not present

## 2021-12-13 DIAGNOSIS — D509 Iron deficiency anemia, unspecified: Secondary | ICD-10-CM | POA: Diagnosis not present

## 2021-12-13 DIAGNOSIS — D631 Anemia in chronic kidney disease: Secondary | ICD-10-CM | POA: Diagnosis not present

## 2021-12-15 ENCOUNTER — Other Ambulatory Visit: Payer: Self-pay | Admitting: Internal Medicine

## 2021-12-16 DIAGNOSIS — Z992 Dependence on renal dialysis: Secondary | ICD-10-CM | POA: Diagnosis not present

## 2021-12-16 DIAGNOSIS — N2581 Secondary hyperparathyroidism of renal origin: Secondary | ICD-10-CM | POA: Diagnosis not present

## 2021-12-16 DIAGNOSIS — N186 End stage renal disease: Secondary | ICD-10-CM | POA: Diagnosis not present

## 2021-12-17 DIAGNOSIS — E1122 Type 2 diabetes mellitus with diabetic chronic kidney disease: Secondary | ICD-10-CM | POA: Diagnosis not present

## 2021-12-17 DIAGNOSIS — Z992 Dependence on renal dialysis: Secondary | ICD-10-CM | POA: Diagnosis not present

## 2021-12-17 DIAGNOSIS — I491 Atrial premature depolarization: Secondary | ICD-10-CM | POA: Diagnosis not present

## 2021-12-17 DIAGNOSIS — N185 Chronic kidney disease, stage 5: Secondary | ICD-10-CM | POA: Diagnosis not present

## 2021-12-18 DIAGNOSIS — D631 Anemia in chronic kidney disease: Secondary | ICD-10-CM | POA: Diagnosis not present

## 2021-12-18 DIAGNOSIS — N186 End stage renal disease: Secondary | ICD-10-CM | POA: Diagnosis not present

## 2021-12-18 DIAGNOSIS — D509 Iron deficiency anemia, unspecified: Secondary | ICD-10-CM | POA: Diagnosis not present

## 2021-12-18 DIAGNOSIS — N2581 Secondary hyperparathyroidism of renal origin: Secondary | ICD-10-CM | POA: Diagnosis not present

## 2021-12-20 DIAGNOSIS — D631 Anemia in chronic kidney disease: Secondary | ICD-10-CM | POA: Diagnosis not present

## 2021-12-20 DIAGNOSIS — N2581 Secondary hyperparathyroidism of renal origin: Secondary | ICD-10-CM | POA: Diagnosis not present

## 2021-12-20 DIAGNOSIS — N186 End stage renal disease: Secondary | ICD-10-CM | POA: Diagnosis not present

## 2021-12-20 DIAGNOSIS — D509 Iron deficiency anemia, unspecified: Secondary | ICD-10-CM | POA: Diagnosis not present

## 2021-12-23 DIAGNOSIS — N186 End stage renal disease: Secondary | ICD-10-CM | POA: Diagnosis not present

## 2021-12-23 DIAGNOSIS — D509 Iron deficiency anemia, unspecified: Secondary | ICD-10-CM | POA: Diagnosis not present

## 2021-12-23 DIAGNOSIS — D631 Anemia in chronic kidney disease: Secondary | ICD-10-CM | POA: Diagnosis not present

## 2021-12-23 DIAGNOSIS — N2581 Secondary hyperparathyroidism of renal origin: Secondary | ICD-10-CM | POA: Diagnosis not present

## 2021-12-25 DIAGNOSIS — N2581 Secondary hyperparathyroidism of renal origin: Secondary | ICD-10-CM | POA: Diagnosis not present

## 2021-12-25 DIAGNOSIS — N186 End stage renal disease: Secondary | ICD-10-CM | POA: Diagnosis not present

## 2021-12-25 DIAGNOSIS — D509 Iron deficiency anemia, unspecified: Secondary | ICD-10-CM | POA: Diagnosis not present

## 2021-12-25 DIAGNOSIS — D631 Anemia in chronic kidney disease: Secondary | ICD-10-CM | POA: Diagnosis not present

## 2021-12-26 ENCOUNTER — Ambulatory Visit (INDEPENDENT_AMBULATORY_CARE_PROVIDER_SITE_OTHER): Payer: Medicare PPO

## 2021-12-26 ENCOUNTER — Telehealth: Payer: Medicare PPO

## 2021-12-26 DIAGNOSIS — D631 Anemia in chronic kidney disease: Secondary | ICD-10-CM | POA: Diagnosis not present

## 2021-12-26 DIAGNOSIS — N186 End stage renal disease: Secondary | ICD-10-CM

## 2021-12-26 DIAGNOSIS — E1122 Type 2 diabetes mellitus with diabetic chronic kidney disease: Secondary | ICD-10-CM

## 2021-12-26 DIAGNOSIS — I129 Hypertensive chronic kidney disease with stage 1 through stage 4 chronic kidney disease, or unspecified chronic kidney disease: Secondary | ICD-10-CM

## 2021-12-26 DIAGNOSIS — I10 Essential (primary) hypertension: Secondary | ICD-10-CM | POA: Diagnosis not present

## 2021-12-26 NOTE — Patient Instructions (Signed)
Visit Information  Thank you for taking time to visit with me today. Please don't hesitate to contact me if I can be of assistance to you before our next scheduled telephone appointment.  Following are the goals we discussed today:  (Copy and paste patient goals from clinical care plan here)  Our next appointment is by telephone on 04/22/22 at 10:30 AM   Please call the care guide team at [phone] if you need to cancel or reschedule your appointment.   If you are experiencing a Mental Health or [place] or need someone to talk to, please call [phone]-TALK (toll free, 24 hour hotline)   Patient verbalizes understanding of instructions and care plan provided today and agrees to view in [place]. Active MyChart status and patient understanding of how to access instructions and care plan via MyChart confirmed with patient.     Barb Merino, RN, BSN, CCM Care Management Coordinator [place] [facility]  Direct Phone: [phone]

## 2021-12-26 NOTE — Chronic Care Management (AMB) (Signed)
Chronic Care Management   CCM RN Visit Note  12/26/2021 Name: Jennifer Payne MRN: [id number] DOB: 07-26-1951  Subjective: Jennifer Payne is a 71 y.o. year old female who is a primary care patient of Glendale Chard, MD. The care management team was consulted for assistance with disease management and care coordination needs.    Engaged with patient by telephone for follow up visit in response to provider referral for case management and/or care coordination services.   Consent to Services:  The patient was given information about Chronic Care Management services, agreed to services, and gave verbal consent prior to initiation of services.  Please see initial visit note for detailed documentation.   Patient agreed to services and verbal consent obtained.   Assessment: Review of patient past medical history, allergies, medications, health status, including review of consultants reports, laboratory and other test data, was performed as part of comprehensive evaluation and provision of chronic care management services.   SDOH (Social Determinants of Health) assessments and interventions performed:  Yes, no acute needs  CCM Care Plan  Allergies  Allergen Reactions   Erythromycin Rash    Outpatient Encounter Medications as of 12/26/2021  Medication Sig   atorvastatin (LIPITOR) 80 MG tablet Take 1 tablet by mouth once daily   calcium acetate (PHOSLO) 667 MG tablet TAKE 1 TABLET BY MOUTH ONCE DAILY WITH MEALS   levothyroxine (SYNTHROID) 88 MCG tablet Take 1 tablet by mouth once daily   sevelamer carbonate (RENVELA) 800 MG tablet TAKE 3 TABLETS BY MOUTH WITH MEALS   No facility-administered encounter medications on file as of 12/26/2021.    Patient Active Problem List   Diagnosis Date Noted   Pain in toes of both feet 05/20/2020   Class 1 obesity due to excess calories with serious comorbidity and body mass index (BMI) of 34.0 to 34.9 in adult 05/20/2020   Type 2 diabetes mellitus  with stage 4 chronic kidney disease, with long-term current use of insulin ([place]) 08/19/2018   Hypertensive nephropathy 08/19/2018   Primary hypothyroidism 08/19/2018   Proliferative diabetic retinopathy associated with type 2 diabetes mellitus ([place]) 08/19/2018   Encounter for general adult medical examination without abnormal findings 01/14/2018    Conditions to be addressed/monitored: ESRD on dialysis, 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, Hypertensive nephropathy  Care Plan : RN Care Manager Plan of Care  Updates made by Lynne Logan, RN since 12/26/2021 12:00 AM     Problem: No plan of care established for mangement of chronic care states (ESRD on dialysis, 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, Hypertensive nephropathy)   Priority: High     Long-Range Goal: Development of plan of care for chronic disease management for ESRD on dialysis, 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, Hypertensive nephropathy   Start Date: 05/31/2021  Expected End Date: 04/30/2022  Recent Progress: On track  Priority: High  Note:   Current Barriers:  Knowledge Deficits related to plan of care for management of ESRD on dialysis, 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, Hypertensive nephropathy Chronic Disease Management support and education needs related to ESRD on dialysis, 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, Hypertensive nephropathy  RNCM Clinical Goal(s):  Patient will verbalize basic understanding of  ESRD on dialysis, 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   Interventions: 1:1 collaboration with primary care provider regarding development and update of comprehensive plan of care as evidenced by provider attestation and co-signature Inter-disciplinary care team collaboration (see longitudinal plan of care) Evaluation of current treatment plan related to  self management and patient's adherence to plan as established by provider   Hypertension Interventions:  (Status:  Goal on track:  Yes.) Long Term Goal Last practice recorded BP readings:  BP Readings from Last 3 Encounters:  12/05/21 130/72  08/06/21 138/78  01/10/21 122/80  Most recent eGFR/CrCl:  Lab Results  Component Value Date   EGFR 5 (L) 08/06/2021    No components found for: CRCL Evaluation of current treatment plan related to hypertension self management and patient's adherence to plan as established by provider Review of patient status, including review of consultant's reports, relevant laboratory and other test results, and medications completed Discussed plans with patient for ongoing care management follow up and provided patient with direct contact information for care management team  Diabetes Interventions:  (Status:  Goal on track:  Yes.) Long Term Goal Assessed patient's understanding of A1c goal: <6.5% Counseled on importance of regular laboratory monitoring as prescribed Review of patient status, including review of consultants reports, relevant laboratory and other test results, and medications completed Assessed social determinant of health barriers Discussed plans with patient for ongoing care management follow up and provided patient with direct contact information for care management team Lab Results  Component Value Date   HGBA1C 6.9 (H) 12/05/2021   Lab Results   Component Value Date   HGBA1C 6.7 (H) 08/06/2021    End Stage Renal Disease Interventions:  (Status:  Goal on track:  Yes.) Long Term Goal Assessed the Patient understanding of End Stage Renal disease    Evaluation of current treatment plan related to End Stage Renal disease self management and patient's adherence to plan as established by provider      Reviewed prescribed diet, patient is adhering to her prescribed renal diet as directed Engaged patient in proactive and ongoing discussion about goals of care and what matters most to them    Discussed and reviewed recent update to patient's renal transplant status, determined patient has been placed on the wait list and has been instructed accordingly Discussed recent call for transplant, unfortunately the kidney was not viable for use once the patient arrived to the hospital  Determined patient verbalizes understanding of next steps regarding her renal transplant status Discussed plans with patient for ongoing care management follow up and provided patient with direct contact information for care management team Last practice recorded BP readings:  BP Readings from Last 3 Encounters:  12/05/21 130/72  08/06/21 138/78  01/10/21 122/80  Most recent eGFR/CrCl:  Lab Results  Component Value Date   EGFR 5 (L) 08/06/2021    No components found for: CRCL  Hyperlipidemia Interventions:  (Status:  Condition stable.  Not addressed this visit.) Long Term Goal Evaluation of current treatment plan related to Hyperlipidemia self management and patient's adherence to plan as established by provider   Medication review  performed; medication list updated in electronic medical record, instructed patient of Dr. Baird Cancer recommendation to resume Atorvastatin 80 mg qd, patient verbalizes understanding and will resume this therapy as directed Provider established cholesterol goals reviewed Counseled on importance of regular laboratory monitoring as  prescribed Provided HLD educational materials Reviewed role and benefits of statin for ASCVD risk reduction Reviewed importance of limiting foods high in cholesterol Reviewed exercise goals and target of 150 minutes per week Discussed plans with patient for ongoing care management follow up and provided patient with direct contact information for care management team Lipid Panel     Component Value Date/Time   CHOL 206 (H) 08/06/2021 1232   TRIG 85 08/06/2021 1232   HDL 58 08/06/2021 1232   CHOLHDL 3.6 08/06/2021 1232   LDLCALC 133 (H) 08/06/2021 1232   LABVLDL 15 08/06/2021 1232      Patient Goals/Self-Care Activities: Take all medications as prescribed Attend all scheduled provider appointments Call pharmacy for medication refills 3-7 days in advance of running out of medications Perform all self care activities independently  Perform IADL's (shopping, preparing meals, housekeeping, managing finances) independently Call provider office for new concerns or questions  check feet daily for cuts, sores or redness manage portion size call doctor for signs and symptoms of high blood pressure take medications for blood pressure exactly as prescribed report new symptoms to your doctor  Follow Up Plan:  Telephone follow up appointment with care management team member scheduled for:  04/22/22     Barb Merino, RN, BSN, CCM Care Management Coordinator [place] [facility]  Direct Phone: [phone]

## 2021-12-27 DIAGNOSIS — D509 Iron deficiency anemia, unspecified: Secondary | ICD-10-CM | POA: Diagnosis not present

## 2021-12-27 DIAGNOSIS — N2581 Secondary hyperparathyroidism of renal origin: Secondary | ICD-10-CM | POA: Diagnosis not present

## 2021-12-27 DIAGNOSIS — N186 End stage renal disease: Secondary | ICD-10-CM | POA: Diagnosis not present

## 2021-12-27 DIAGNOSIS — D631 Anemia in chronic kidney disease: Secondary | ICD-10-CM | POA: Diagnosis not present

## 2021-12-30 DIAGNOSIS — N2581 Secondary hyperparathyroidism of renal origin: Secondary | ICD-10-CM | POA: Diagnosis not present

## 2021-12-30 DIAGNOSIS — D509 Iron deficiency anemia, unspecified: Secondary | ICD-10-CM | POA: Diagnosis not present

## 2021-12-30 DIAGNOSIS — D631 Anemia in chronic kidney disease: Secondary | ICD-10-CM | POA: Diagnosis not present

## 2021-12-30 DIAGNOSIS — N186 End stage renal disease: Secondary | ICD-10-CM | POA: Diagnosis not present

## 2022-01-01 DIAGNOSIS — E119 Type 2 diabetes mellitus without complications: Secondary | ICD-10-CM | POA: Diagnosis not present

## 2022-01-01 DIAGNOSIS — N186 End stage renal disease: Secondary | ICD-10-CM | POA: Diagnosis not present

## 2022-01-01 DIAGNOSIS — D631 Anemia in chronic kidney disease: Secondary | ICD-10-CM | POA: Diagnosis not present

## 2022-01-01 DIAGNOSIS — N2581 Secondary hyperparathyroidism of renal origin: Secondary | ICD-10-CM | POA: Diagnosis not present

## 2022-01-01 DIAGNOSIS — D509 Iron deficiency anemia, unspecified: Secondary | ICD-10-CM | POA: Diagnosis not present

## 2022-01-03 DIAGNOSIS — D631 Anemia in chronic kidney disease: Secondary | ICD-10-CM | POA: Diagnosis not present

## 2022-01-03 DIAGNOSIS — D509 Iron deficiency anemia, unspecified: Secondary | ICD-10-CM | POA: Diagnosis not present

## 2022-01-03 DIAGNOSIS — N2581 Secondary hyperparathyroidism of renal origin: Secondary | ICD-10-CM | POA: Diagnosis not present

## 2022-01-03 DIAGNOSIS — N186 End stage renal disease: Secondary | ICD-10-CM | POA: Diagnosis not present

## 2022-01-06 DIAGNOSIS — N186 End stage renal disease: Secondary | ICD-10-CM | POA: Diagnosis not present

## 2022-01-06 DIAGNOSIS — D631 Anemia in chronic kidney disease: Secondary | ICD-10-CM | POA: Diagnosis not present

## 2022-01-06 DIAGNOSIS — D509 Iron deficiency anemia, unspecified: Secondary | ICD-10-CM | POA: Diagnosis not present

## 2022-01-06 DIAGNOSIS — N2581 Secondary hyperparathyroidism of renal origin: Secondary | ICD-10-CM | POA: Diagnosis not present

## 2022-01-08 DIAGNOSIS — N186 End stage renal disease: Secondary | ICD-10-CM | POA: Diagnosis not present

## 2022-01-08 DIAGNOSIS — N2581 Secondary hyperparathyroidism of renal origin: Secondary | ICD-10-CM | POA: Diagnosis not present

## 2022-01-08 DIAGNOSIS — D509 Iron deficiency anemia, unspecified: Secondary | ICD-10-CM | POA: Diagnosis not present

## 2022-01-08 DIAGNOSIS — D631 Anemia in chronic kidney disease: Secondary | ICD-10-CM | POA: Diagnosis not present

## 2022-01-09 ENCOUNTER — Other Ambulatory Visit: Payer: Self-pay | Admitting: Internal Medicine

## 2022-01-10 DIAGNOSIS — D509 Iron deficiency anemia, unspecified: Secondary | ICD-10-CM | POA: Diagnosis not present

## 2022-01-10 DIAGNOSIS — D631 Anemia in chronic kidney disease: Secondary | ICD-10-CM | POA: Diagnosis not present

## 2022-01-10 DIAGNOSIS — N186 End stage renal disease: Secondary | ICD-10-CM | POA: Diagnosis not present

## 2022-01-10 DIAGNOSIS — N2581 Secondary hyperparathyroidism of renal origin: Secondary | ICD-10-CM | POA: Diagnosis not present

## 2022-01-13 DIAGNOSIS — D631 Anemia in chronic kidney disease: Secondary | ICD-10-CM | POA: Diagnosis not present

## 2022-01-13 DIAGNOSIS — N2581 Secondary hyperparathyroidism of renal origin: Secondary | ICD-10-CM | POA: Diagnosis not present

## 2022-01-13 DIAGNOSIS — N186 End stage renal disease: Secondary | ICD-10-CM | POA: Diagnosis not present

## 2022-01-13 DIAGNOSIS — D509 Iron deficiency anemia, unspecified: Secondary | ICD-10-CM | POA: Diagnosis not present

## 2022-01-15 DIAGNOSIS — N186 End stage renal disease: Secondary | ICD-10-CM | POA: Diagnosis not present

## 2022-01-15 DIAGNOSIS — D631 Anemia in chronic kidney disease: Secondary | ICD-10-CM | POA: Diagnosis not present

## 2022-01-15 DIAGNOSIS — N2581 Secondary hyperparathyroidism of renal origin: Secondary | ICD-10-CM | POA: Diagnosis not present

## 2022-01-15 DIAGNOSIS — D509 Iron deficiency anemia, unspecified: Secondary | ICD-10-CM | POA: Diagnosis not present

## 2022-01-17 DIAGNOSIS — N2581 Secondary hyperparathyroidism of renal origin: Secondary | ICD-10-CM | POA: Diagnosis not present

## 2022-01-17 DIAGNOSIS — N186 End stage renal disease: Secondary | ICD-10-CM | POA: Diagnosis not present

## 2022-01-17 DIAGNOSIS — D631 Anemia in chronic kidney disease: Secondary | ICD-10-CM | POA: Diagnosis not present

## 2022-01-17 DIAGNOSIS — D509 Iron deficiency anemia, unspecified: Secondary | ICD-10-CM | POA: Diagnosis not present

## 2022-01-20 DIAGNOSIS — N2581 Secondary hyperparathyroidism of renal origin: Secondary | ICD-10-CM | POA: Diagnosis not present

## 2022-01-20 DIAGNOSIS — D631 Anemia in chronic kidney disease: Secondary | ICD-10-CM | POA: Diagnosis not present

## 2022-01-20 DIAGNOSIS — N186 End stage renal disease: Secondary | ICD-10-CM | POA: Diagnosis not present

## 2022-01-20 DIAGNOSIS — D509 Iron deficiency anemia, unspecified: Secondary | ICD-10-CM | POA: Diagnosis not present

## 2022-01-22 DIAGNOSIS — N186 End stage renal disease: Secondary | ICD-10-CM | POA: Diagnosis not present

## 2022-01-22 DIAGNOSIS — N2581 Secondary hyperparathyroidism of renal origin: Secondary | ICD-10-CM | POA: Diagnosis not present

## 2022-01-22 DIAGNOSIS — D631 Anemia in chronic kidney disease: Secondary | ICD-10-CM | POA: Diagnosis not present

## 2022-01-22 DIAGNOSIS — D509 Iron deficiency anemia, unspecified: Secondary | ICD-10-CM | POA: Diagnosis not present

## 2022-01-24 DIAGNOSIS — E1122 Type 2 diabetes mellitus with diabetic chronic kidney disease: Secondary | ICD-10-CM

## 2022-01-24 DIAGNOSIS — I120 Hypertensive chronic kidney disease with stage 5 chronic kidney disease or end stage renal disease: Secondary | ICD-10-CM | POA: Diagnosis not present

## 2022-01-24 DIAGNOSIS — Z992 Dependence on renal dialysis: Secondary | ICD-10-CM | POA: Diagnosis not present

## 2022-01-24 DIAGNOSIS — E785 Hyperlipidemia, unspecified: Secondary | ICD-10-CM

## 2022-01-24 DIAGNOSIS — N2581 Secondary hyperparathyroidism of renal origin: Secondary | ICD-10-CM | POA: Diagnosis not present

## 2022-01-24 DIAGNOSIS — N186 End stage renal disease: Secondary | ICD-10-CM | POA: Diagnosis not present

## 2022-01-24 DIAGNOSIS — D509 Iron deficiency anemia, unspecified: Secondary | ICD-10-CM | POA: Diagnosis not present

## 2022-01-24 DIAGNOSIS — D631 Anemia in chronic kidney disease: Secondary | ICD-10-CM | POA: Diagnosis not present

## 2022-01-25 DIAGNOSIS — I10 Essential (primary) hypertension: Secondary | ICD-10-CM | POA: Diagnosis not present

## 2022-01-25 DIAGNOSIS — D631 Anemia in chronic kidney disease: Secondary | ICD-10-CM | POA: Diagnosis not present

## 2022-01-25 DIAGNOSIS — N186 End stage renal disease: Secondary | ICD-10-CM | POA: Diagnosis not present

## 2022-01-27 DIAGNOSIS — D631 Anemia in chronic kidney disease: Secondary | ICD-10-CM | POA: Diagnosis not present

## 2022-01-27 DIAGNOSIS — N2581 Secondary hyperparathyroidism of renal origin: Secondary | ICD-10-CM | POA: Diagnosis not present

## 2022-01-27 DIAGNOSIS — N186 End stage renal disease: Secondary | ICD-10-CM | POA: Diagnosis not present

## 2022-01-27 DIAGNOSIS — D509 Iron deficiency anemia, unspecified: Secondary | ICD-10-CM | POA: Diagnosis not present

## 2022-01-29 DIAGNOSIS — D509 Iron deficiency anemia, unspecified: Secondary | ICD-10-CM | POA: Diagnosis not present

## 2022-01-29 DIAGNOSIS — N186 End stage renal disease: Secondary | ICD-10-CM | POA: Diagnosis not present

## 2022-01-29 DIAGNOSIS — E119 Type 2 diabetes mellitus without complications: Secondary | ICD-10-CM | POA: Diagnosis not present

## 2022-01-29 DIAGNOSIS — D631 Anemia in chronic kidney disease: Secondary | ICD-10-CM | POA: Diagnosis not present

## 2022-01-29 DIAGNOSIS — N2581 Secondary hyperparathyroidism of renal origin: Secondary | ICD-10-CM | POA: Diagnosis not present

## 2022-01-31 DIAGNOSIS — D631 Anemia in chronic kidney disease: Secondary | ICD-10-CM | POA: Diagnosis not present

## 2022-01-31 DIAGNOSIS — N186 End stage renal disease: Secondary | ICD-10-CM | POA: Diagnosis not present

## 2022-01-31 DIAGNOSIS — N2581 Secondary hyperparathyroidism of renal origin: Secondary | ICD-10-CM | POA: Diagnosis not present

## 2022-01-31 DIAGNOSIS — D509 Iron deficiency anemia, unspecified: Secondary | ICD-10-CM | POA: Diagnosis not present

## 2022-02-03 DIAGNOSIS — D509 Iron deficiency anemia, unspecified: Secondary | ICD-10-CM | POA: Diagnosis not present

## 2022-02-03 DIAGNOSIS — N186 End stage renal disease: Secondary | ICD-10-CM | POA: Diagnosis not present

## 2022-02-03 DIAGNOSIS — N2581 Secondary hyperparathyroidism of renal origin: Secondary | ICD-10-CM | POA: Diagnosis not present

## 2022-02-03 DIAGNOSIS — D631 Anemia in chronic kidney disease: Secondary | ICD-10-CM | POA: Diagnosis not present

## 2022-02-05 DIAGNOSIS — D631 Anemia in chronic kidney disease: Secondary | ICD-10-CM | POA: Diagnosis not present

## 2022-02-05 DIAGNOSIS — N2581 Secondary hyperparathyroidism of renal origin: Secondary | ICD-10-CM | POA: Diagnosis not present

## 2022-02-05 DIAGNOSIS — D509 Iron deficiency anemia, unspecified: Secondary | ICD-10-CM | POA: Diagnosis not present

## 2022-02-05 DIAGNOSIS — N186 End stage renal disease: Secondary | ICD-10-CM | POA: Diagnosis not present

## 2022-02-06 DIAGNOSIS — Z6836 Body mass index (BMI) 36.0-36.9, adult: Secondary | ICD-10-CM | POA: Diagnosis not present

## 2022-02-06 DIAGNOSIS — Z01419 Encounter for gynecological examination (general) (routine) without abnormal findings: Secondary | ICD-10-CM | POA: Diagnosis not present

## 2022-02-07 DIAGNOSIS — D631 Anemia in chronic kidney disease: Secondary | ICD-10-CM | POA: Diagnosis not present

## 2022-02-07 DIAGNOSIS — D509 Iron deficiency anemia, unspecified: Secondary | ICD-10-CM | POA: Diagnosis not present

## 2022-02-07 DIAGNOSIS — N186 End stage renal disease: Secondary | ICD-10-CM | POA: Diagnosis not present

## 2022-02-07 DIAGNOSIS — N2581 Secondary hyperparathyroidism of renal origin: Secondary | ICD-10-CM | POA: Diagnosis not present

## 2022-02-10 DIAGNOSIS — N2581 Secondary hyperparathyroidism of renal origin: Secondary | ICD-10-CM | POA: Diagnosis not present

## 2022-02-10 DIAGNOSIS — D509 Iron deficiency anemia, unspecified: Secondary | ICD-10-CM | POA: Diagnosis not present

## 2022-02-10 DIAGNOSIS — D631 Anemia in chronic kidney disease: Secondary | ICD-10-CM | POA: Diagnosis not present

## 2022-02-10 DIAGNOSIS — N186 End stage renal disease: Secondary | ICD-10-CM | POA: Diagnosis not present

## 2022-02-12 DIAGNOSIS — D509 Iron deficiency anemia, unspecified: Secondary | ICD-10-CM | POA: Diagnosis not present

## 2022-02-12 DIAGNOSIS — D631 Anemia in chronic kidney disease: Secondary | ICD-10-CM | POA: Diagnosis not present

## 2022-02-12 DIAGNOSIS — N186 End stage renal disease: Secondary | ICD-10-CM | POA: Diagnosis not present

## 2022-02-12 DIAGNOSIS — N2581 Secondary hyperparathyroidism of renal origin: Secondary | ICD-10-CM | POA: Diagnosis not present

## 2022-02-13 DIAGNOSIS — E039 Hypothyroidism, unspecified: Secondary | ICD-10-CM | POA: Diagnosis not present

## 2022-02-13 DIAGNOSIS — E1151 Type 2 diabetes mellitus with diabetic peripheral angiopathy without gangrene: Secondary | ICD-10-CM | POA: Diagnosis not present

## 2022-02-13 DIAGNOSIS — N186 End stage renal disease: Secondary | ICD-10-CM | POA: Diagnosis not present

## 2022-02-13 DIAGNOSIS — E1122 Type 2 diabetes mellitus with diabetic chronic kidney disease: Secondary | ICD-10-CM | POA: Diagnosis not present

## 2022-02-13 DIAGNOSIS — E785 Hyperlipidemia, unspecified: Secondary | ICD-10-CM | POA: Diagnosis not present

## 2022-02-13 DIAGNOSIS — Z992 Dependence on renal dialysis: Secondary | ICD-10-CM | POA: Diagnosis not present

## 2022-02-13 DIAGNOSIS — R030 Elevated blood-pressure reading, without diagnosis of hypertension: Secondary | ICD-10-CM | POA: Diagnosis not present

## 2022-02-14 DIAGNOSIS — D509 Iron deficiency anemia, unspecified: Secondary | ICD-10-CM | POA: Diagnosis not present

## 2022-02-14 DIAGNOSIS — N2581 Secondary hyperparathyroidism of renal origin: Secondary | ICD-10-CM | POA: Diagnosis not present

## 2022-02-14 DIAGNOSIS — D631 Anemia in chronic kidney disease: Secondary | ICD-10-CM | POA: Diagnosis not present

## 2022-02-14 DIAGNOSIS — N186 End stage renal disease: Secondary | ICD-10-CM | POA: Diagnosis not present

## 2022-02-15 ENCOUNTER — Encounter: Payer: Self-pay | Admitting: Internal Medicine

## 2022-02-17 DIAGNOSIS — D509 Iron deficiency anemia, unspecified: Secondary | ICD-10-CM | POA: Diagnosis not present

## 2022-02-17 DIAGNOSIS — D631 Anemia in chronic kidney disease: Secondary | ICD-10-CM | POA: Diagnosis not present

## 2022-02-17 DIAGNOSIS — N186 End stage renal disease: Secondary | ICD-10-CM | POA: Diagnosis not present

## 2022-02-17 DIAGNOSIS — N2581 Secondary hyperparathyroidism of renal origin: Secondary | ICD-10-CM | POA: Diagnosis not present

## 2022-02-19 DIAGNOSIS — D509 Iron deficiency anemia, unspecified: Secondary | ICD-10-CM | POA: Diagnosis not present

## 2022-02-19 DIAGNOSIS — N2581 Secondary hyperparathyroidism of renal origin: Secondary | ICD-10-CM | POA: Diagnosis not present

## 2022-02-19 DIAGNOSIS — N186 End stage renal disease: Secondary | ICD-10-CM | POA: Diagnosis not present

## 2022-02-19 DIAGNOSIS — D631 Anemia in chronic kidney disease: Secondary | ICD-10-CM | POA: Diagnosis not present

## 2022-02-21 DIAGNOSIS — N186 End stage renal disease: Secondary | ICD-10-CM | POA: Diagnosis not present

## 2022-02-21 DIAGNOSIS — N2581 Secondary hyperparathyroidism of renal origin: Secondary | ICD-10-CM | POA: Diagnosis not present

## 2022-02-21 DIAGNOSIS — D509 Iron deficiency anemia, unspecified: Secondary | ICD-10-CM | POA: Diagnosis not present

## 2022-02-21 DIAGNOSIS — D631 Anemia in chronic kidney disease: Secondary | ICD-10-CM | POA: Diagnosis not present

## 2022-02-24 DIAGNOSIS — D509 Iron deficiency anemia, unspecified: Secondary | ICD-10-CM | POA: Diagnosis not present

## 2022-02-24 DIAGNOSIS — N2581 Secondary hyperparathyroidism of renal origin: Secondary | ICD-10-CM | POA: Diagnosis not present

## 2022-02-24 DIAGNOSIS — N186 End stage renal disease: Secondary | ICD-10-CM | POA: Diagnosis not present

## 2022-02-24 DIAGNOSIS — D631 Anemia in chronic kidney disease: Secondary | ICD-10-CM | POA: Diagnosis not present

## 2022-02-25 DIAGNOSIS — D631 Anemia in chronic kidney disease: Secondary | ICD-10-CM | POA: Diagnosis not present

## 2022-02-25 DIAGNOSIS — I10 Essential (primary) hypertension: Secondary | ICD-10-CM | POA: Diagnosis not present

## 2022-02-25 DIAGNOSIS — N186 End stage renal disease: Secondary | ICD-10-CM | POA: Diagnosis not present

## 2022-02-26 DIAGNOSIS — E119 Type 2 diabetes mellitus without complications: Secondary | ICD-10-CM | POA: Diagnosis not present

## 2022-02-26 DIAGNOSIS — N186 End stage renal disease: Secondary | ICD-10-CM | POA: Diagnosis not present

## 2022-02-26 DIAGNOSIS — D631 Anemia in chronic kidney disease: Secondary | ICD-10-CM | POA: Diagnosis not present

## 2022-02-26 DIAGNOSIS — N2581 Secondary hyperparathyroidism of renal origin: Secondary | ICD-10-CM | POA: Diagnosis not present

## 2022-02-28 DIAGNOSIS — N2581 Secondary hyperparathyroidism of renal origin: Secondary | ICD-10-CM | POA: Diagnosis not present

## 2022-02-28 DIAGNOSIS — N186 End stage renal disease: Secondary | ICD-10-CM | POA: Diagnosis not present

## 2022-02-28 DIAGNOSIS — D631 Anemia in chronic kidney disease: Secondary | ICD-10-CM | POA: Diagnosis not present

## 2022-03-03 DIAGNOSIS — D631 Anemia in chronic kidney disease: Secondary | ICD-10-CM | POA: Diagnosis not present

## 2022-03-03 DIAGNOSIS — N186 End stage renal disease: Secondary | ICD-10-CM | POA: Diagnosis not present

## 2022-03-03 DIAGNOSIS — N2581 Secondary hyperparathyroidism of renal origin: Secondary | ICD-10-CM | POA: Diagnosis not present

## 2022-03-05 DIAGNOSIS — D631 Anemia in chronic kidney disease: Secondary | ICD-10-CM | POA: Diagnosis not present

## 2022-03-05 DIAGNOSIS — N186 End stage renal disease: Secondary | ICD-10-CM | POA: Diagnosis not present

## 2022-03-05 DIAGNOSIS — N2581 Secondary hyperparathyroidism of renal origin: Secondary | ICD-10-CM | POA: Diagnosis not present

## 2022-03-06 ENCOUNTER — Other Ambulatory Visit: Payer: Self-pay | Admitting: Internal Medicine

## 2022-03-07 DIAGNOSIS — N2581 Secondary hyperparathyroidism of renal origin: Secondary | ICD-10-CM | POA: Diagnosis not present

## 2022-03-07 DIAGNOSIS — D631 Anemia in chronic kidney disease: Secondary | ICD-10-CM | POA: Diagnosis not present

## 2022-03-07 DIAGNOSIS — N186 End stage renal disease: Secondary | ICD-10-CM | POA: Diagnosis not present

## 2022-03-10 DIAGNOSIS — N186 End stage renal disease: Secondary | ICD-10-CM | POA: Diagnosis not present

## 2022-03-10 DIAGNOSIS — N2581 Secondary hyperparathyroidism of renal origin: Secondary | ICD-10-CM | POA: Diagnosis not present

## 2022-03-10 DIAGNOSIS — D631 Anemia in chronic kidney disease: Secondary | ICD-10-CM | POA: Diagnosis not present

## 2022-03-12 DIAGNOSIS — N2581 Secondary hyperparathyroidism of renal origin: Secondary | ICD-10-CM | POA: Diagnosis not present

## 2022-03-12 DIAGNOSIS — N186 End stage renal disease: Secondary | ICD-10-CM | POA: Diagnosis not present

## 2022-03-12 DIAGNOSIS — D631 Anemia in chronic kidney disease: Secondary | ICD-10-CM | POA: Diagnosis not present

## 2022-03-14 DIAGNOSIS — N186 End stage renal disease: Secondary | ICD-10-CM | POA: Diagnosis not present

## 2022-03-14 DIAGNOSIS — N2581 Secondary hyperparathyroidism of renal origin: Secondary | ICD-10-CM | POA: Diagnosis not present

## 2022-03-14 DIAGNOSIS — D631 Anemia in chronic kidney disease: Secondary | ICD-10-CM | POA: Diagnosis not present

## 2022-03-17 DIAGNOSIS — N186 End stage renal disease: Secondary | ICD-10-CM | POA: Diagnosis not present

## 2022-03-17 DIAGNOSIS — N2581 Secondary hyperparathyroidism of renal origin: Secondary | ICD-10-CM | POA: Diagnosis not present

## 2022-03-17 DIAGNOSIS — D631 Anemia in chronic kidney disease: Secondary | ICD-10-CM | POA: Diagnosis not present

## 2022-03-18 DIAGNOSIS — H26491 Other secondary cataract, right eye: Secondary | ICD-10-CM | POA: Diagnosis not present

## 2022-03-18 DIAGNOSIS — E113513 Type 2 diabetes mellitus with proliferative diabetic retinopathy with macular edema, bilateral: Secondary | ICD-10-CM | POA: Diagnosis not present

## 2022-03-18 DIAGNOSIS — H31093 Other chorioretinal scars, bilateral: Secondary | ICD-10-CM | POA: Diagnosis not present

## 2022-03-18 NOTE — Progress Notes (Signed)
This encounter was created in error - please disregard.

## 2022-03-19 DIAGNOSIS — N186 End stage renal disease: Secondary | ICD-10-CM | POA: Diagnosis not present

## 2022-03-19 DIAGNOSIS — N2581 Secondary hyperparathyroidism of renal origin: Secondary | ICD-10-CM | POA: Diagnosis not present

## 2022-03-19 DIAGNOSIS — D631 Anemia in chronic kidney disease: Secondary | ICD-10-CM | POA: Diagnosis not present

## 2022-03-21 DIAGNOSIS — N186 End stage renal disease: Secondary | ICD-10-CM | POA: Diagnosis not present

## 2022-03-21 DIAGNOSIS — N2581 Secondary hyperparathyroidism of renal origin: Secondary | ICD-10-CM | POA: Diagnosis not present

## 2022-03-21 DIAGNOSIS — D631 Anemia in chronic kidney disease: Secondary | ICD-10-CM | POA: Diagnosis not present

## 2022-03-24 DIAGNOSIS — N186 End stage renal disease: Secondary | ICD-10-CM | POA: Diagnosis not present

## 2022-03-24 DIAGNOSIS — N2581 Secondary hyperparathyroidism of renal origin: Secondary | ICD-10-CM | POA: Diagnosis not present

## 2022-03-24 DIAGNOSIS — D631 Anemia in chronic kidney disease: Secondary | ICD-10-CM | POA: Diagnosis not present

## 2022-03-26 DIAGNOSIS — D631 Anemia in chronic kidney disease: Secondary | ICD-10-CM | POA: Diagnosis not present

## 2022-03-26 DIAGNOSIS — N186 End stage renal disease: Secondary | ICD-10-CM | POA: Diagnosis not present

## 2022-03-26 DIAGNOSIS — N2581 Secondary hyperparathyroidism of renal origin: Secondary | ICD-10-CM | POA: Diagnosis not present

## 2022-03-28 DIAGNOSIS — D631 Anemia in chronic kidney disease: Secondary | ICD-10-CM | POA: Diagnosis not present

## 2022-03-28 DIAGNOSIS — N2581 Secondary hyperparathyroidism of renal origin: Secondary | ICD-10-CM | POA: Diagnosis not present

## 2022-03-28 DIAGNOSIS — N186 End stage renal disease: Secondary | ICD-10-CM | POA: Diagnosis not present

## 2022-03-28 DIAGNOSIS — I10 Essential (primary) hypertension: Secondary | ICD-10-CM | POA: Diagnosis not present

## 2022-03-31 DIAGNOSIS — N2581 Secondary hyperparathyroidism of renal origin: Secondary | ICD-10-CM | POA: Diagnosis not present

## 2022-03-31 DIAGNOSIS — D631 Anemia in chronic kidney disease: Secondary | ICD-10-CM | POA: Diagnosis not present

## 2022-03-31 DIAGNOSIS — N186 End stage renal disease: Secondary | ICD-10-CM | POA: Diagnosis not present

## 2022-04-02 DIAGNOSIS — E119 Type 2 diabetes mellitus without complications: Secondary | ICD-10-CM | POA: Diagnosis not present

## 2022-04-02 DIAGNOSIS — N186 End stage renal disease: Secondary | ICD-10-CM | POA: Diagnosis not present

## 2022-04-02 DIAGNOSIS — N2581 Secondary hyperparathyroidism of renal origin: Secondary | ICD-10-CM | POA: Diagnosis not present

## 2022-04-02 DIAGNOSIS — D631 Anemia in chronic kidney disease: Secondary | ICD-10-CM | POA: Diagnosis not present

## 2022-04-04 DIAGNOSIS — N2581 Secondary hyperparathyroidism of renal origin: Secondary | ICD-10-CM | POA: Diagnosis not present

## 2022-04-04 DIAGNOSIS — D631 Anemia in chronic kidney disease: Secondary | ICD-10-CM | POA: Diagnosis not present

## 2022-04-04 DIAGNOSIS — N186 End stage renal disease: Secondary | ICD-10-CM | POA: Diagnosis not present

## 2022-04-07 DIAGNOSIS — D631 Anemia in chronic kidney disease: Secondary | ICD-10-CM | POA: Diagnosis not present

## 2022-04-07 DIAGNOSIS — N186 End stage renal disease: Secondary | ICD-10-CM | POA: Diagnosis not present

## 2022-04-07 DIAGNOSIS — N2581 Secondary hyperparathyroidism of renal origin: Secondary | ICD-10-CM | POA: Diagnosis not present

## 2022-04-09 DIAGNOSIS — N186 End stage renal disease: Secondary | ICD-10-CM | POA: Diagnosis not present

## 2022-04-09 DIAGNOSIS — N2581 Secondary hyperparathyroidism of renal origin: Secondary | ICD-10-CM | POA: Diagnosis not present

## 2022-04-09 DIAGNOSIS — D631 Anemia in chronic kidney disease: Secondary | ICD-10-CM | POA: Diagnosis not present

## 2022-04-11 DIAGNOSIS — N186 End stage renal disease: Secondary | ICD-10-CM | POA: Diagnosis not present

## 2022-04-11 DIAGNOSIS — D631 Anemia in chronic kidney disease: Secondary | ICD-10-CM | POA: Diagnosis not present

## 2022-04-11 DIAGNOSIS — N2581 Secondary hyperparathyroidism of renal origin: Secondary | ICD-10-CM | POA: Diagnosis not present

## 2022-04-14 DIAGNOSIS — N186 End stage renal disease: Secondary | ICD-10-CM | POA: Diagnosis not present

## 2022-04-14 DIAGNOSIS — N2581 Secondary hyperparathyroidism of renal origin: Secondary | ICD-10-CM | POA: Diagnosis not present

## 2022-04-14 DIAGNOSIS — D631 Anemia in chronic kidney disease: Secondary | ICD-10-CM | POA: Diagnosis not present

## 2022-04-16 DIAGNOSIS — N186 End stage renal disease: Secondary | ICD-10-CM | POA: Diagnosis not present

## 2022-04-16 DIAGNOSIS — N2581 Secondary hyperparathyroidism of renal origin: Secondary | ICD-10-CM | POA: Diagnosis not present

## 2022-04-16 DIAGNOSIS — D631 Anemia in chronic kidney disease: Secondary | ICD-10-CM | POA: Diagnosis not present

## 2022-04-17 ENCOUNTER — Encounter: Payer: Self-pay | Admitting: Internal Medicine

## 2022-04-17 ENCOUNTER — Ambulatory Visit (INDEPENDENT_AMBULATORY_CARE_PROVIDER_SITE_OTHER): Payer: Medicare PPO | Admitting: Internal Medicine

## 2022-04-17 VITALS — BP 130/80 | HR 80 | Temp 98.2°F | Ht 66.0 in | Wt 234.0 lb

## 2022-04-17 DIAGNOSIS — N186 End stage renal disease: Secondary | ICD-10-CM | POA: Diagnosis not present

## 2022-04-17 DIAGNOSIS — E1122 Type 2 diabetes mellitus with diabetic chronic kidney disease: Secondary | ICD-10-CM | POA: Diagnosis not present

## 2022-04-17 DIAGNOSIS — Z6837 Body mass index (BMI) 37.0-37.9, adult: Secondary | ICD-10-CM | POA: Diagnosis not present

## 2022-04-17 DIAGNOSIS — Z0000 Encounter for general adult medical examination without abnormal findings: Secondary | ICD-10-CM | POA: Diagnosis not present

## 2022-04-17 DIAGNOSIS — Z992 Dependence on renal dialysis: Secondary | ICD-10-CM | POA: Diagnosis not present

## 2022-04-17 DIAGNOSIS — E039 Hypothyroidism, unspecified: Secondary | ICD-10-CM

## 2022-04-17 DIAGNOSIS — N2581 Secondary hyperparathyroidism of renal origin: Secondary | ICD-10-CM | POA: Diagnosis not present

## 2022-04-17 DIAGNOSIS — E7800 Pure hypercholesterolemia, unspecified: Secondary | ICD-10-CM | POA: Diagnosis not present

## 2022-04-17 DIAGNOSIS — I120 Hypertensive chronic kidney disease with stage 5 chronic kidney disease or end stage renal disease: Secondary | ICD-10-CM | POA: Diagnosis not present

## 2022-04-17 DIAGNOSIS — Z23 Encounter for immunization: Secondary | ICD-10-CM | POA: Diagnosis not present

## 2022-04-17 MED ORDER — ATORVASTATIN CALCIUM 80 MG PO TABS: 80.0000 mg | ORAL_TABLET | Freq: Every day | ORAL | 2 refills | Status: DC

## 2022-04-17 NOTE — Patient Instructions (Signed)

## 2022-04-17 NOTE — Progress Notes (Signed)
Rich Brave Llittleton,acting as a Education administrator for Maximino Greenland, MD.,have documented all relevant documentation on the behalf of Maximino Greenland, MD,as directed by  Maximino Greenland, MD while in the presence of Maximino Greenland, MD.   Subjective:     Patient ID: Jennifer Payne , female    DOB: 1951-04-23 , 71 y.o.   MRN: [id number]   Chief Complaint  Patient presents with   Annual Exam    HPI  She is here today for a full physical exam.  She is followed by GYN for pelvic exams. She has no specific concerns or complaints at this time. She is now receiving hemodialysis on MWF.   Diabetes She presents for her follow-up diabetic visit. She has type 2 diabetes mellitus. Her disease course has been stable. There are no hypoglycemic associated symptoms. There are no hypoglycemic complications. Risk factors for coronary artery disease include diabetes mellitus, dyslipidemia, hypertension, obesity, post-menopausal and sedentary lifestyle.  Hypertension This is a chronic problem. The current episode started more than 1 year ago. The problem has been gradually improving since onset. The problem is uncontrolled.     Past Medical History:  Diagnosis Date   Cataract    Diabetes mellitus    Hyperlipidemia    Hypertension    Thyroid disease      Family History  Problem Relation Age of Onset   Alzheimer's disease Mother    Coronary artery disease Mother    Hyperlipidemia Mother    Obesity Mother    Hypertension Father    Colon cancer Neg Hx      Current Outpatient Medications:    calcium acetate (PHOSLO) 667 MG tablet, TAKE 1 TABLET BY MOUTH ONCE DAILY WITH MEALS, Disp: , Rfl:    levothyroxine (SYNTHROID) 88 MCG tablet, Take 1 tablet by mouth once daily, Disp: 90 tablet, Rfl: 0   sevelamer carbonate (RENVELA) 800 MG tablet, TAKE 3 TABLETS BY MOUTH WITH MEALS, Disp: , Rfl:    atorvastatin (LIPITOR) 80 MG tablet, Take 1 tablet (80 mg total) by mouth daily., Disp: 90 tablet, Rfl: 2    Allergies  Allergen Reactions   Erythromycin Rash      The patient states she uses post menopausal status for birth control. Last LMP was No LMP recorded. Patient is postmenopausal.. Negative for Dysmenorrhea. Negative for: breast discharge, breast lump(s), breast pain and breast self exam. Associated symptoms include abnormal vaginal bleeding. Pertinent negatives include abnormal bleeding (hematology), anxiety, decreased libido, depression, difficulty falling sleep, dyspareunia, history of infertility, nocturia, sexual dysfunction, sleep disturbances, urinary incontinence, urinary urgency, vaginal discharge and vaginal itching. Diet regular.The patient states her exercise level is  intermittent.  . The patient's tobacco use is:  Social History   Tobacco Use  Smoking Status Never  Smokeless Tobacco Never  . She has been exposed to passive smoke. The patient's alcohol use is:  Social History   Substance and Sexual Activity  Alcohol Use No    Review of Systems  Constitutional: Negative.   HENT: Negative.    Eyes: Negative.   Respiratory: Negative.    Cardiovascular: Negative.   Gastrointestinal: Negative.   Endocrine: Negative.   Genitourinary: Negative.   Musculoskeletal: Negative.   Skin: Negative.   Allergic/Immunologic: Negative.   Neurological: Negative.   Hematological: Negative.   Psychiatric/Behavioral: Negative.       Today's Vitals   04/17/22 1106  BP: 130/80  Pulse: 80  Temp: 98.2 F (36.8 C)  Weight: 234 lb (106.1  kg)  Height: _0  (1.676 m)  PainSc: 0-No pain   Body mass index is 37.77 kg/m.  Wt Readings from Last 3 Encounters:  04/17/22 234 lb (106.1 kg)  12/05/21 238 lb 12.8 oz (108.3 kg)  08/06/21 239 lb 3.2 oz (108.5 kg)     Objective:  Physical Exam Vitals and nursing note reviewed.  Constitutional:      Appearance: Normal appearance.  HENT:     Head: Normocephalic and atraumatic.     Right Ear: Tympanic membrane, ear canal and  external ear normal.     Left Ear: Tympanic membrane, ear canal and external ear normal.     Nose:     Comments: Masked     Mouth/Throat:     Comments: Masked  Eyes:     Extraocular Movements: Extraocular movements intact.     Conjunctiva/sclera: Conjunctivae normal.     Pupils: Pupils are equal, round, and reactive to light.  Cardiovascular:     Rate and Rhythm: Normal rate and regular rhythm.     Pulses:          Dorsalis pedis pulses are 1+ on the right side and 1+ on the left side.     Heart sounds: Normal heart sounds.  Pulmonary:     Effort: Pulmonary effort is normal.     Breath sounds: Normal breath sounds.  Chest:  Breasts:    Tanner Score is 5.     Right: Normal.     Left: Normal.  Abdominal:     General: Abdomen is flat. Bowel sounds are normal.     Palpations: Abdomen is soft.  Genitourinary:    Comments: deferred Musculoskeletal:        General: Normal range of motion.     Cervical back: Normal range of motion and neck supple.     Right lower leg: Edema present.     Left lower leg: Edema present.  Feet:     Right foot:     Protective Sensation: 4 sites tested.  4 sites sensed.     Skin integrity: Callus and dry skin present.     Toenail Condition: Right toenails are abnormally thick.     Left foot:     Protective Sensation: 5 sites tested.  5 sites sensed.     Skin integrity: Callus and dry skin present.     Toenail Condition: Left toenails are abnormally thick.     Comments: Brawny skin b/l LE Skin:    General: Skin is warm and dry.     Comments: B/l LE - changes c/w venous insufficiency  Neurological:     General: No focal deficit present.     Mental Status: She is alert and oriented to person, place, and time.  Psychiatric:        Mood and Affect: Mood normal.        Behavior: Behavior normal.         Assessment And Plan:     1. Encounter for general adult medical examination w/o abnormal findings Comments: A full exam was performed.  Importance of monthly self breast exams was discussed with the patient. PATIENT IS ADVISED TO GET 30-45 MINUTES REGULAR EXERCISE NO LESS THAN FOUR TO FIVE DAYS PER WEEK - BOTH WEIGHTBEARING EXERCISES AND AEROBIC ARE RECOMMENDED.  PATIENT IS ADVISED TO FOLLOW A HEALTHY DIET WITH AT LEAST SIX FRUITS/VEGGIES PER DAY, DECREASE INTAKE OF RED MEAT, AND TO INCREASE FISH INTAKE TO TWO DAYS PER WEEK.  MEATS/FISH SHOULD  NOT BE FRIED, BAKED OR BROILED IS PREFERABLE.  IT IS ALSO IMPORTANT TO CUT BACK ON YOUR SUGAR INTAKE. PLEASE AVOID ANYTHING WITH ADDED SUGAR, CORN SYRUP OR OTHER SWEETENERS. IF YOU MUST USE A SWEETENER, YOU CAN TRY STEVIA. IT IS ALSO IMPORTANT TO AVOID ARTIFICIALLY SWEETENERS AND DIET BEVERAGES. LASTLY, I SUGGEST WEARING SPF 50 SUNSCREEN ON EXPOSED PARTS AND ESPECIALLY WHEN IN THE DIRECT SUNLIGHT FOR AN EXTENDED PERIOD OF TIME.  PLEASE AVOID FAST FOOD RESTAURANTS AND INCREASE YOUR WATER INTAKE.  2. 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 Diet controlled.  I will check labs as below. She will rto in 3-4 months for re-evaluation.  I DISCUSSED WITH THE PATIENT AT LENGTH REGARDING THE GOALS OF GLYCEMIC CONTROL AND POSSIBLE LONG-TERM COMPLICATIONS.  I  ALSO STRESSED THE IMPORTANCE OF COMPLIANCE WITH HOME GLUCOSE MONITORING, DIETARY RESTRICTIONS INCLUDING AVOIDANCE OF SUGARY DRINKS/PROCESSED FOODS,  ALONG WITH REGULAR EXERCISE.  I  ALSO STRESSED THE IMPORTANCE OF ANNUAL EYE EXAMS, SELF FOOT CARE AND COMPLIANCE WITH OFFICE VISITS.  - EKG 12-Lead - CMP14+EGFR - CBC - Hemoglobin A1c - Lipid panel - atorvastatin (LIPITOR) 80 MG tablet; Take 1 tablet (80 mg total) by mouth daily.  Dispense: 90 tablet; Refill: 2 - Ambulatory referral to Podiatry  3. Secondary hyperparathyroidism ([place]) Comments: Chronic, will check PTH level at next visit.   4. Pure hypercholesterolemia Comments: Chronic, LDL goal <70. She will c/w atorvastatin 12m daily.  She is encouraged to follow heart healthy diet, Mediterranean for example.  - Lipid panel  5. Primary hypothyroidism Comments: Last TSH 1.7 in May 2023, no need to recheck today.  She will c/w Synthroid 811m daily.   6. Class 2 severe obesity due to excess calories with serious comorbidity and body mass index (BMI) of 37.0 to 37.9 in adult [facility] She is encouraged to aim for at least 150 minutes of exercise per week, while striving for BMI<30 to decrease cardiac risk.   7. Immunization due - Flu Vaccine QUAD High Dose(Fluad)  Patient was given opportunity to ask questions. Patient verbalized understanding of the plan and was able to repeat key elements of the plan. All questions were answered to their satisfaction.   I, RoMaximino GreenlandMD, have reviewed all documentation for this visit. The documentation on 04/17/22 for the exam, diagnosis, procedures, and orders are all accurate and complete.  THE PATIENT IS ENCOURAGED TO PRACTICE SOCIAL DISTANCING DUE TO THE COVID-19 PANDEMIC.

## 2022-04-18 DIAGNOSIS — N186 End stage renal disease: Secondary | ICD-10-CM | POA: Diagnosis not present

## 2022-04-18 DIAGNOSIS — D631 Anemia in chronic kidney disease: Secondary | ICD-10-CM | POA: Diagnosis not present

## 2022-04-18 DIAGNOSIS — N2581 Secondary hyperparathyroidism of renal origin: Secondary | ICD-10-CM | POA: Diagnosis not present

## 2022-04-18 LAB — CMP14+EGFR
ALT: 9 IU/L (ref 0–32)
AST: 17 IU/L (ref 0–40)
Albumin/Globulin Ratio: 1.1 — ABNORMAL LOW (ref 1.2–2.2)
Albumin: 3.9 g/dL (ref 3.8–4.8)
Alkaline Phosphatase: 172 IU/L — ABNORMAL HIGH (ref 44–121)
BUN/Creatinine Ratio: 4 — ABNORMAL LOW (ref 12–28)
BUN: 31 mg/dL — ABNORMAL HIGH (ref 8–27)
Bilirubin Total: 0.4 mg/dL (ref 0.0–1.2)
CO2: 27 mmol/L (ref 20–29)
Calcium: 9 mg/dL (ref 8.7–10.3)
Chloride: 96 mmol/L (ref 96–106)
Creatinine, Ser: 7.5 mg/dL — ABNORMAL HIGH (ref 0.57–1.00)
Globulin, Total: 3.4 g/dL (ref 1.5–4.5)
Glucose: 160 mg/dL — ABNORMAL HIGH (ref 70–99)
Potassium: 4.8 mmol/L (ref 3.5–5.2)
Sodium: 139 mmol/L (ref 134–144)
Total Protein: 7.3 g/dL (ref 6.0–8.5)
eGFR: 5 mL/min/{1.73_m2} — ABNORMAL LOW (ref >59)

## 2022-04-18 LAB — CBC
Hematocrit: 36.9 % (ref 34.0–46.6)
Hemoglobin: 11.6 g/dL (ref 11.1–15.9)
MCH: 29.1 pg (ref 26.6–33.0)
MCHC: 31.4 g/dL — ABNORMAL LOW (ref 31.5–35.7)
MCV: 93 fL (ref 79–97)
Platelets: 203 10*3/uL (ref 150–450)
RBC: 3.98 x10E6/uL (ref 3.77–5.28)
RDW: 12.1 % (ref 11.7–15.4)
WBC: 6.6 10*3/uL (ref 3.4–10.8)

## 2022-04-18 LAB — HEMOGLOBIN A1C
Est. average glucose Bld gHb Est-mCnc: 180 mg/dL
Hgb A1c MFr Bld: 7.9 % — ABNORMAL HIGH (ref 4.8–5.6)

## 2022-04-18 LAB — LIPID PANEL
Chol/HDL Ratio: 2.1 ratio (ref 0.0–4.4)
Cholesterol, Total: 136 mg/dL (ref 100–199)
HDL: 64 mg/dL (ref >39)
LDL Chol Calc (NIH): 55 mg/dL (ref 0–99)
Triglycerides: 92 mg/dL (ref 0–149)
VLDL Cholesterol Cal: 17 mg/dL (ref 5–40)

## 2022-04-21 DIAGNOSIS — N186 End stage renal disease: Secondary | ICD-10-CM | POA: Diagnosis not present

## 2022-04-21 DIAGNOSIS — D631 Anemia in chronic kidney disease: Secondary | ICD-10-CM | POA: Diagnosis not present

## 2022-04-21 DIAGNOSIS — N2581 Secondary hyperparathyroidism of renal origin: Secondary | ICD-10-CM | POA: Diagnosis not present

## 2022-04-22 ENCOUNTER — Ambulatory Visit: Payer: Self-pay

## 2022-04-22 NOTE — Patient Instructions (Signed)
Visit Information  Thank you for taking time to visit with me today. Please don't hesitate to contact me if I can be of assistance to you.   Following are the goals we discussed today:   Goals Addressed               This Visit's Progress     Patient Stated     I would like to lower my A1c (pt-stated)        Care Coordination Interventions: Provided education to patient about basic DM disease process Review of patient status, including review of consultants reports, relevant laboratory and other test results, and medications completed Determined patient is not checking her sugars regularly Educated patient regarding the importance to monitor CBG's as directed to help guide her and her doctor on how her body is responding to her diabetes meds, physical activity and the foods she eats Educated patient about the provider exercise program through the [facility], patient declines she [facility] Determined patient has scheduled appt with Podiatry for foot care  Determined patient will schedule a visit for insulin training per PCP recommendations and ask her PCP about prescribing a Libre sensor for continuous blood glucose monitoring  Mailed printed educational materials related to Diabetes management            Our next appointment is by telephone on 05/22/22 at 0900 AM  Please call the care guide team at [phone] if you need to cancel or reschedule your appointment.   If you are experiencing a Mental Health or [place] or need someone to talk to, please call [phone]-TALK (toll free, 24 hour hotline)  Patient verbalizes understanding of instructions and care plan provided today and agrees to view in [place]. Active MyChart status and patient understanding of how to access instructions and care plan via MyChart confirmed with patient.     Barb Merino, RN, BSN, CCM Care Management Coordinator [place] [facility]  Direct Phone: [phone]

## 2022-04-22 NOTE — Patient Outreach (Signed)
  Care Coordination   Follow Up Visit Note   04/22/2022 Name: Jennifer Payne MRN: [id number] DOB: 1951-03-05  Jennifer Payne is a 71 y.o. year old female who sees Jennifer Chard, MD for primary care. I spoke with  Jennifer Payne by phone today.  What matters to the patients health and wellness today?  Patient would like to lower her A1c.     Goals Addressed               This Visit's Progress     Patient Stated     I would like to lower my A1c (pt-stated)        Care Coordination Interventions: Provided education to patient about basic DM disease process Review of patient status, including review of consultants reports, relevant laboratory and other test results, and medications completed Determined patient is not checking her sugars regularly Educated patient regarding the importance to monitor CBG's as directed to help guide her and her doctor on how her body is responding to her diabetes meds, physical activity and the foods she eats Educated patient about the provider exercise program through the [facility], patient declines she has [facility] Determined patient has scheduled appt with Podiatry for foot care  Determined patient will schedule a visit for insulin training per PCP recommendations and ask her PCP about prescribing a Libre sensor for continuous blood glucose monitoring  Mailed printed educational materials related to Diabetes management            SDOH assessments and interventions completed:  No     Care Coordination Interventions Activated:  Yes  Care Coordination Interventions:  Yes, provided   Follow up plan: Follow up call scheduled for 05/22/22 '@0900'$  AM     Encounter Outcome:  Pt. Visit Completed

## 2022-04-23 DIAGNOSIS — N186 End stage renal disease: Secondary | ICD-10-CM | POA: Diagnosis not present

## 2022-04-23 DIAGNOSIS — D631 Anemia in chronic kidney disease: Secondary | ICD-10-CM | POA: Diagnosis not present

## 2022-04-23 DIAGNOSIS — N2581 Secondary hyperparathyroidism of renal origin: Secondary | ICD-10-CM | POA: Diagnosis not present

## 2022-04-25 DIAGNOSIS — D631 Anemia in chronic kidney disease: Secondary | ICD-10-CM | POA: Diagnosis not present

## 2022-04-25 DIAGNOSIS — N186 End stage renal disease: Secondary | ICD-10-CM | POA: Diagnosis not present

## 2022-04-25 DIAGNOSIS — N2581 Secondary hyperparathyroidism of renal origin: Secondary | ICD-10-CM | POA: Diagnosis not present

## 2022-04-27 DIAGNOSIS — I10 Essential (primary) hypertension: Secondary | ICD-10-CM | POA: Diagnosis not present

## 2022-04-27 DIAGNOSIS — D631 Anemia in chronic kidney disease: Secondary | ICD-10-CM | POA: Diagnosis not present

## 2022-04-27 DIAGNOSIS — N186 End stage renal disease: Secondary | ICD-10-CM | POA: Diagnosis not present

## 2022-04-28 DIAGNOSIS — D631 Anemia in chronic kidney disease: Secondary | ICD-10-CM | POA: Diagnosis not present

## 2022-04-28 DIAGNOSIS — N186 End stage renal disease: Secondary | ICD-10-CM | POA: Diagnosis not present

## 2022-04-28 DIAGNOSIS — N2581 Secondary hyperparathyroidism of renal origin: Secondary | ICD-10-CM | POA: Diagnosis not present

## 2022-04-28 DIAGNOSIS — U071 COVID-19: Secondary | ICD-10-CM | POA: Diagnosis not present

## 2022-04-30 DIAGNOSIS — N2581 Secondary hyperparathyroidism of renal origin: Secondary | ICD-10-CM | POA: Diagnosis not present

## 2022-04-30 DIAGNOSIS — U071 COVID-19: Secondary | ICD-10-CM | POA: Diagnosis not present

## 2022-04-30 DIAGNOSIS — E119 Type 2 diabetes mellitus without complications: Secondary | ICD-10-CM | POA: Diagnosis not present

## 2022-04-30 DIAGNOSIS — N186 End stage renal disease: Secondary | ICD-10-CM | POA: Diagnosis not present

## 2022-04-30 DIAGNOSIS — D631 Anemia in chronic kidney disease: Secondary | ICD-10-CM | POA: Diagnosis not present

## 2022-05-02 DIAGNOSIS — N2581 Secondary hyperparathyroidism of renal origin: Secondary | ICD-10-CM | POA: Diagnosis not present

## 2022-05-02 DIAGNOSIS — D631 Anemia in chronic kidney disease: Secondary | ICD-10-CM | POA: Diagnosis not present

## 2022-05-02 DIAGNOSIS — N186 End stage renal disease: Secondary | ICD-10-CM | POA: Diagnosis not present

## 2022-05-02 DIAGNOSIS — U071 COVID-19: Secondary | ICD-10-CM | POA: Diagnosis not present

## 2022-05-05 ENCOUNTER — Other Ambulatory Visit: Payer: Self-pay | Admitting: Internal Medicine

## 2022-05-05 DIAGNOSIS — U071 COVID-19: Secondary | ICD-10-CM | POA: Diagnosis not present

## 2022-05-05 DIAGNOSIS — Z1231 Encounter for screening mammogram for malignant neoplasm of breast: Secondary | ICD-10-CM

## 2022-05-05 DIAGNOSIS — N2581 Secondary hyperparathyroidism of renal origin: Secondary | ICD-10-CM | POA: Diagnosis not present

## 2022-05-05 DIAGNOSIS — N186 End stage renal disease: Secondary | ICD-10-CM | POA: Diagnosis not present

## 2022-05-05 DIAGNOSIS — D631 Anemia in chronic kidney disease: Secondary | ICD-10-CM | POA: Diagnosis not present

## 2022-05-07 DIAGNOSIS — N2581 Secondary hyperparathyroidism of renal origin: Secondary | ICD-10-CM | POA: Diagnosis not present

## 2022-05-07 DIAGNOSIS — D631 Anemia in chronic kidney disease: Secondary | ICD-10-CM | POA: Diagnosis not present

## 2022-05-07 DIAGNOSIS — N186 End stage renal disease: Secondary | ICD-10-CM | POA: Diagnosis not present

## 2022-05-07 DIAGNOSIS — U071 COVID-19: Secondary | ICD-10-CM | POA: Diagnosis not present

## 2022-05-09 DIAGNOSIS — N186 End stage renal disease: Secondary | ICD-10-CM | POA: Diagnosis not present

## 2022-05-09 DIAGNOSIS — N2581 Secondary hyperparathyroidism of renal origin: Secondary | ICD-10-CM | POA: Diagnosis not present

## 2022-05-09 DIAGNOSIS — U071 COVID-19: Secondary | ICD-10-CM | POA: Diagnosis not present

## 2022-05-09 DIAGNOSIS — D631 Anemia in chronic kidney disease: Secondary | ICD-10-CM | POA: Diagnosis not present

## 2022-05-12 DIAGNOSIS — N186 End stage renal disease: Secondary | ICD-10-CM | POA: Diagnosis not present

## 2022-05-12 DIAGNOSIS — D631 Anemia in chronic kidney disease: Secondary | ICD-10-CM | POA: Diagnosis not present

## 2022-05-12 DIAGNOSIS — U071 COVID-19: Secondary | ICD-10-CM | POA: Diagnosis not present

## 2022-05-12 DIAGNOSIS — N2581 Secondary hyperparathyroidism of renal origin: Secondary | ICD-10-CM | POA: Diagnosis not present

## 2022-05-13 DIAGNOSIS — M2042 Other hammer toe(s) (acquired), left foot: Secondary | ICD-10-CM | POA: Diagnosis not present

## 2022-05-13 DIAGNOSIS — E1100 Type 2 diabetes mellitus with hyperosmolarity without nonketotic hyperglycemic-hyperosmolar coma (NKHHC): Secondary | ICD-10-CM | POA: Diagnosis not present

## 2022-05-13 DIAGNOSIS — B351 Tinea unguium: Secondary | ICD-10-CM | POA: Diagnosis not present

## 2022-05-13 DIAGNOSIS — M79671 Pain in right foot: Secondary | ICD-10-CM | POA: Diagnosis not present

## 2022-05-13 DIAGNOSIS — M79672 Pain in left foot: Secondary | ICD-10-CM | POA: Diagnosis not present

## 2022-05-13 DIAGNOSIS — M2041 Other hammer toe(s) (acquired), right foot: Secondary | ICD-10-CM | POA: Diagnosis not present

## 2022-05-14 DIAGNOSIS — U071 COVID-19: Secondary | ICD-10-CM | POA: Diagnosis not present

## 2022-05-14 DIAGNOSIS — N2581 Secondary hyperparathyroidism of renal origin: Secondary | ICD-10-CM | POA: Diagnosis not present

## 2022-05-14 DIAGNOSIS — N186 End stage renal disease: Secondary | ICD-10-CM | POA: Diagnosis not present

## 2022-05-14 DIAGNOSIS — D631 Anemia in chronic kidney disease: Secondary | ICD-10-CM | POA: Diagnosis not present

## 2022-05-16 DIAGNOSIS — D631 Anemia in chronic kidney disease: Secondary | ICD-10-CM | POA: Diagnosis not present

## 2022-05-16 DIAGNOSIS — N2581 Secondary hyperparathyroidism of renal origin: Secondary | ICD-10-CM | POA: Diagnosis not present

## 2022-05-16 DIAGNOSIS — N186 End stage renal disease: Secondary | ICD-10-CM | POA: Diagnosis not present

## 2022-05-16 DIAGNOSIS — U071 COVID-19: Secondary | ICD-10-CM | POA: Diagnosis not present

## 2022-05-19 ENCOUNTER — Encounter: Payer: Self-pay | Admitting: Internal Medicine

## 2022-05-19 ENCOUNTER — Ambulatory Visit: Payer: Medicare PPO | Admitting: Internal Medicine

## 2022-05-19 VITALS — BP 138/78 | HR 95 | Temp 98.3°F | Ht 66.0 in | Wt 230.2 lb

## 2022-05-19 DIAGNOSIS — Z6837 Body mass index (BMI) 37.0-37.9, adult: Secondary | ICD-10-CM

## 2022-05-19 DIAGNOSIS — R0981 Nasal congestion: Secondary | ICD-10-CM

## 2022-05-19 DIAGNOSIS — J069 Acute upper respiratory infection, unspecified: Secondary | ICD-10-CM

## 2022-05-19 DIAGNOSIS — W19XXXA Unspecified fall, initial encounter: Secondary | ICD-10-CM | POA: Diagnosis not present

## 2022-05-19 DIAGNOSIS — J029 Acute pharyngitis, unspecified: Secondary | ICD-10-CM

## 2022-05-19 LAB — POC INFLUENZA A&B (BINAX/QUICKVUE)
Influenza A, POC: NEGATIVE
Influenza B, POC: NEGATIVE

## 2022-05-19 MED ORDER — TRIAMCINOLONE ACETONIDE 40 MG/ML IJ SUSP
40.0000 mg | Freq: Once | INTRAMUSCULAR | Status: AC
  Administered 2022-05-19: 40 mg via INTRAMUSCULAR

## 2022-05-19 MED ORDER — BENZONATATE 100 MG PO CAPS
100.0000 mg | ORAL_CAPSULE | Freq: Three times a day (TID) | ORAL | 1 refills | Status: DC | PRN
Start: 2022-05-19 — End: 2023-01-28

## 2022-05-19 NOTE — Progress Notes (Signed)
Rich Brave Llittleton,acting as a Education administrator for Maximino Greenland, MD.,have documented all relevant documentation on the behalf of Maximino Greenland, MD,as directed by  Maximino Greenland, MD while in the presence of Maximino Greenland, MD.    Subjective:     Patient ID: Jennifer Payne , female    DOB: 05/07/1951 , 71 y.o.   MRN: [id number]   Chief Complaint  Patient presents with   URI    HPI  Patient presents today for cold symptoms. She states her cough started last Thursday. She states it is productive of greenish-yellow sputum. She did not take her temperature at home. She denies having body aches and headaches.  She does have sinus congestion. She has taken Tylenol, Dayquil and Nyquil without relief of her sx.   She also reports having a fall yesterday. She states she was reaching for something on her nightstand and rolled off the bed. This occurred about 4pm.  Her son was not home to help her get off the floor.  She states she stayed on the floor all night because she did not have the strength to get up. She called him this morning to help her get off of the floor. Today, she has had coffee and water.   URI  This is a new problem. The current episode started in the past 7 days. The problem has been unchanged. There has been no fever. Associated symptoms include congestion, coughing, sneezing and a sore throat. Pertinent negatives include no headaches or sinus pain. Associated symptoms comments: dizziness. Treatments tried: nyquil and dayquil and tylenol. The treatment provided no relief.     Past Medical History:  Diagnosis Date   Cataract    Diabetes mellitus    Hyperlipidemia    Hypertension    Thyroid disease      Family History  Problem Relation Age of Onset   Alzheimer's disease Mother    Coronary artery disease Mother    Hyperlipidemia Mother    Obesity Mother    Hypertension Father    Colon cancer Neg Hx      Current Outpatient Medications:    atorvastatin (LIPITOR)  80 MG tablet, Take 1 tablet (80 mg total) by mouth daily., Disp: 90 tablet, Rfl: 2   benzonatate (TESSALON PERLES) 100 MG capsule, Take 1 capsule (100 mg total) by mouth 3 (three) times daily as needed for cough., Disp: 30 capsule, Rfl: 1   calcium acetate (PHOSLO) 667 MG tablet, TAKE 1 TABLET BY MOUTH ONCE DAILY WITH MEALS, Disp: , Rfl:    levothyroxine (SYNTHROID) 88 MCG tablet, Take 1 tablet by mouth once daily, Disp: 90 tablet, Rfl: 0   sevelamer carbonate (RENVELA) 800 MG tablet, TAKE 3 TABLETS BY MOUTH WITH MEALS, Disp: , Rfl:    Allergies  Allergen Reactions   Erythromycin Rash     Review of Systems  Constitutional:  Positive for fatigue. Negative for chills and fever.  HENT:  Positive for congestion, sneezing and sore throat. Negative for sinus pain.   Respiratory:  Positive for cough.   Cardiovascular: Negative.   Gastrointestinal: Negative.   Neurological: Negative.  Negative for headaches.  Psychiatric/Behavioral: Negative.       Today's Vitals   05/19/22 1140  BP: 138/78  Pulse: 95  Temp: 98.3 F (36.8 C)  SpO2: 93%  Weight: 230 lb 3.2 oz (104.4 kg)  Height: '5\' 6"'$  (1.676 m)   Body mass index is 37.16 kg/m.  Wt Readings from Last 3 Encounters:  05/19/22 230 lb 3.2 oz (104.4 kg)  04/17/22 234 lb (106.1 kg)  12/05/21 238 lb 12.8 oz (108.3 kg)     Objective:  Physical Exam Vitals and nursing note reviewed.  Constitutional:      Appearance: Normal appearance. She is obese.  HENT:     Head: Normocephalic and atraumatic.     Nose:     Comments: Masked     Mouth/Throat:     Comments: Masked  Eyes:     Extraocular Movements: Extraocular movements intact.  Cardiovascular:     Rate and Rhythm: Normal rate and regular rhythm.     Heart sounds: Normal heart sounds.  Pulmonary:     Effort: Pulmonary effort is normal.     Breath sounds: Normal breath sounds.  Musculoskeletal:     Cervical back: Normal range of motion.  Skin:    General: Skin is warm.   Neurological:     General: No focal deficit present.     Mental Status: She is alert.  Psychiatric:        Mood and Affect: Mood normal.        Behavior: Behavior normal.      Assessment And Plan:     1. Viral URI with cough Comments: Flu test A/B negative. She agrees to PCR COVID testing. She is encouraged to avoid dairy products for 2 weeks. She was given rx tessalon perles.  - POC Influenza A&B(BINAX/QUICKVUE) - Novel Coronavirus, NAA ([facility]) - benzonatate (TESSALON PERLES) 100 MG capsule; Take 1 capsule (100 mg total) by mouth 3 (three) times daily as needed for cough.  Dispense: 30 capsule; Refill: 1 - triamcinolone acetonide (KENALOG-40) injection 40 mg  2. Nasal congestion Comments: Flu Test A/B negative. Again, she agrees to COVID testing. She will be given Kenalog '40mg'$  IM x 1.  Encouraged to stay well hydrated and avoid dairy.  - triamcinolone acetonide (KENALOG-40) injection 40 mg  3. Fall, initial encounter Comments: This occurred on 05/18/22. She is encouraged to place items on her nightstand close to her bed so she does not have to reach over.   4. BMI 37.0-37.9, adult Comments: She agrees to referral to PREP program.  - Amb Referral To Provider Referral Exercise Program (P.R.E.P)   Patient was given opportunity to ask questions. Patient verbalized understanding of the plan and was able to repeat key elements of the plan. All questions were answered to their satisfaction.   I, Maximino Greenland, MD, have reviewed all documentation for this visit. The documentation on 05/19/22 for the exam, diagnosis, procedures, and orders are all accurate and complete.   IF YOU HAVE BEEN REFERRED TO A SPECIALIST, IT MAY TAKE 1-2 WEEKS TO SCHEDULE/PROCESS THE REFERRAL. IF YOU HAVE NOT HEARD FROM US/SPECIALIST IN TWO WEEKS, PLEASE GIVE [place] A CALL AT [phone].   THE PATIENT IS ENCOURAGED TO PRACTICE SOCIAL DISTANCING DUE TO THE COVID-19 PANDEMIC.

## 2022-05-20 DIAGNOSIS — D631 Anemia in chronic kidney disease: Secondary | ICD-10-CM | POA: Diagnosis not present

## 2022-05-20 DIAGNOSIS — N186 End stage renal disease: Secondary | ICD-10-CM | POA: Diagnosis not present

## 2022-05-20 DIAGNOSIS — N2581 Secondary hyperparathyroidism of renal origin: Secondary | ICD-10-CM | POA: Diagnosis not present

## 2022-05-20 DIAGNOSIS — U071 COVID-19: Secondary | ICD-10-CM | POA: Diagnosis not present

## 2022-05-20 LAB — NOVEL CORONAVIRUS, NAA: SARS-CoV-2, NAA: NOT DETECTED

## 2022-05-21 ENCOUNTER — Telehealth: Payer: Self-pay | Admitting: *Deleted

## 2022-05-21 DIAGNOSIS — N2581 Secondary hyperparathyroidism of renal origin: Secondary | ICD-10-CM | POA: Diagnosis not present

## 2022-05-21 DIAGNOSIS — D631 Anemia in chronic kidney disease: Secondary | ICD-10-CM | POA: Diagnosis not present

## 2022-05-21 DIAGNOSIS — N186 End stage renal disease: Secondary | ICD-10-CM | POA: Diagnosis not present

## 2022-05-21 DIAGNOSIS — U071 COVID-19: Secondary | ICD-10-CM | POA: Diagnosis not present

## 2022-05-21 NOTE — Telephone Encounter (Signed)
Contacted regarding PREP Class referral. Has declined due to distance the [facility] which offer the program are from her home.

## 2022-05-22 ENCOUNTER — Ambulatory Visit: Payer: Self-pay

## 2022-05-22 NOTE — Patient Instructions (Signed)
Visit Information  Thank you for taking time to visit with me today. Please don't hesitate to contact me if I can be of assistance to you.   Following are the goals we discussed today:   Goals Addressed               This Visit's Progress     Patient Stated     I would like to lower my A1c (pt-stated)        Care Coordination Interventions: Provided education to patient about basic DM disease process Review of patient status, including review of consultants reports, relevant laboratory and other test results, and medications completed Determined patient has not started insulin, she prefers to change her diet, she will start using the plate method, educated on portion control Reiterated staying active establishing routine exercise regimen, 30 minutes daily as tolerated  Educated on importance of monitoring blood sugars at home in order to learn how her body is responding to the foods she eats and before/after exercise Determined patient declines wanting to use a Libre sensor due to having dialysis access and having frequent blood pressure checks during treatments [facility] mailed printed educational materials related to diabetes management         Our next appointment is by telephone on 08/21/22 at 10:30 AM  Please call the care guide team at [phone] if you need to cancel or reschedule your appointment.   If you are experiencing a Mental Health or [place] or need someone to talk to, please call 1-800-273-TALK (toll free, 24 hour hotline) go to [facility] [address], [place] [phone])  Patient verbalizes understanding of instructions and care plan provided today and agrees to view in [place]. Active MyChart status and patient understanding of how to access instructions and care plan via MyChart confirmed with patient.     Barb Merino, RN, BSN, CCM Care Management Coordinator [facility] Care Management Direct Phone:  [phone]

## 2022-05-22 NOTE — Patient Outreach (Signed)
  Care Coordination   Follow Up Visit Note   05/22/2022 Name: Jennifer Payne MRN: [id number] DOB: [birth year]  Jennifer Payne is a 71 y.o. year old female who sees Glendale Chard, MD for primary care. I spoke with  Jennifer Payne by phone today.  What matters to the patients health and wellness today?  Patient will work on improving her diet to help lower her A1c. She does not wish to take insulin.     Goals Addressed               This Visit's Progress     Patient Stated     I would like to lower my A1c (pt-stated)        Care Coordination Interventions: Provided education to patient about basic DM disease process Review of patient status, including review of consultants reports, relevant laboratory and other test results, and medications completed Determined patient has not started insulin, she prefers to change her diet, she will start using the plate method, educated on portion control Reiterated staying active establishing routine exercise regimen, 30 minutes daily as tolerated  Educated on importance of monitoring blood sugars at home in order to learn how her body is responding to the foods she eats and before/after exercise Determined patient declines wanting to use a Libre sensor due to having dialysis access and having frequent blood pressure checks during treatments [facility] mailed printed educational materials related to diabetes management         SDOH assessments and interventions completed:  No     Care Coordination Interventions Activated:  Yes  Care Coordination Interventions:  Yes, provided   Follow up plan: Follow up call scheduled for 08/21/22 '@10'$ :30 AM    Encounter Outcome:  Pt. Visit Completed

## 2022-05-23 DIAGNOSIS — U071 COVID-19: Secondary | ICD-10-CM | POA: Diagnosis not present

## 2022-05-23 DIAGNOSIS — N186 End stage renal disease: Secondary | ICD-10-CM | POA: Diagnosis not present

## 2022-05-23 DIAGNOSIS — N2581 Secondary hyperparathyroidism of renal origin: Secondary | ICD-10-CM | POA: Diagnosis not present

## 2022-05-23 DIAGNOSIS — D631 Anemia in chronic kidney disease: Secondary | ICD-10-CM | POA: Diagnosis not present

## 2022-05-26 DIAGNOSIS — N2581 Secondary hyperparathyroidism of renal origin: Secondary | ICD-10-CM | POA: Diagnosis not present

## 2022-05-26 DIAGNOSIS — U071 COVID-19: Secondary | ICD-10-CM | POA: Diagnosis not present

## 2022-05-26 DIAGNOSIS — N186 End stage renal disease: Secondary | ICD-10-CM | POA: Diagnosis not present

## 2022-05-26 DIAGNOSIS — D631 Anemia in chronic kidney disease: Secondary | ICD-10-CM | POA: Diagnosis not present

## 2022-05-28 DIAGNOSIS — I10 Essential (primary) hypertension: Secondary | ICD-10-CM | POA: Diagnosis not present

## 2022-05-28 DIAGNOSIS — N2581 Secondary hyperparathyroidism of renal origin: Secondary | ICD-10-CM | POA: Diagnosis not present

## 2022-05-28 DIAGNOSIS — D631 Anemia in chronic kidney disease: Secondary | ICD-10-CM | POA: Diagnosis not present

## 2022-05-28 DIAGNOSIS — E119 Type 2 diabetes mellitus without complications: Secondary | ICD-10-CM | POA: Diagnosis not present

## 2022-05-28 DIAGNOSIS — N186 End stage renal disease: Secondary | ICD-10-CM | POA: Diagnosis not present

## 2022-05-30 DIAGNOSIS — N2581 Secondary hyperparathyroidism of renal origin: Secondary | ICD-10-CM | POA: Diagnosis not present

## 2022-05-30 DIAGNOSIS — N186 End stage renal disease: Secondary | ICD-10-CM | POA: Diagnosis not present

## 2022-05-30 DIAGNOSIS — D631 Anemia in chronic kidney disease: Secondary | ICD-10-CM | POA: Diagnosis not present

## 2022-06-02 DIAGNOSIS — N2581 Secondary hyperparathyroidism of renal origin: Secondary | ICD-10-CM | POA: Diagnosis not present

## 2022-06-02 DIAGNOSIS — D631 Anemia in chronic kidney disease: Secondary | ICD-10-CM | POA: Diagnosis not present

## 2022-06-02 DIAGNOSIS — N186 End stage renal disease: Secondary | ICD-10-CM | POA: Diagnosis not present

## 2022-06-02 IMAGING — MG MM DIGITAL SCREENING BILAT W/ TOMO AND CAD
6 of 10 series · 6 of 30 positions shown · non-contrast
Comparison: Previous exam(s).

CLINICAL DATA: Screening.

EXAM:
DIGITAL SCREENING BILATERAL MAMMOGRAM WITH TOMOSYNTHESIS AND CAD
TECHNIQUE: Bilateral screening digital craniocaudal and mediolateral oblique
mammograms were obtained. Bilateral screening digital breast
tomosynthesis was performed. The images were evaluated with
computer-aided detection.

[L MLO synth-2D (1 of 2)]
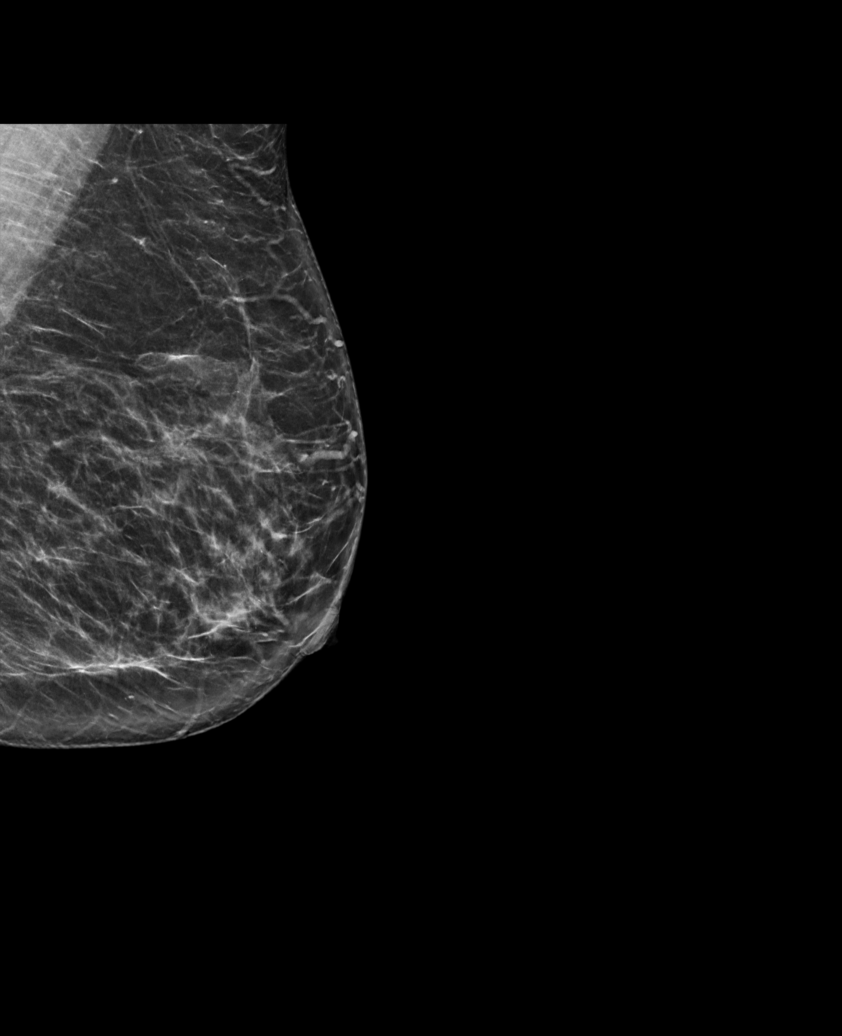

[R CC synth-2D]
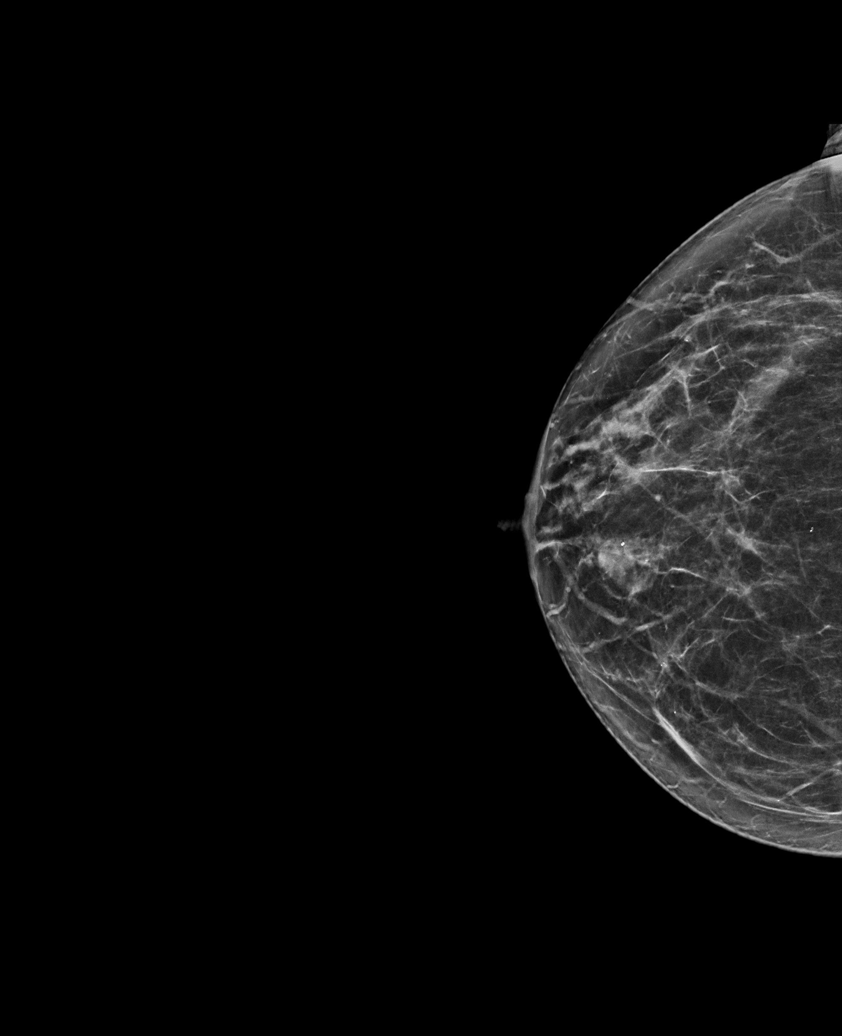

[L MLO synth-2D (2 of 2)]
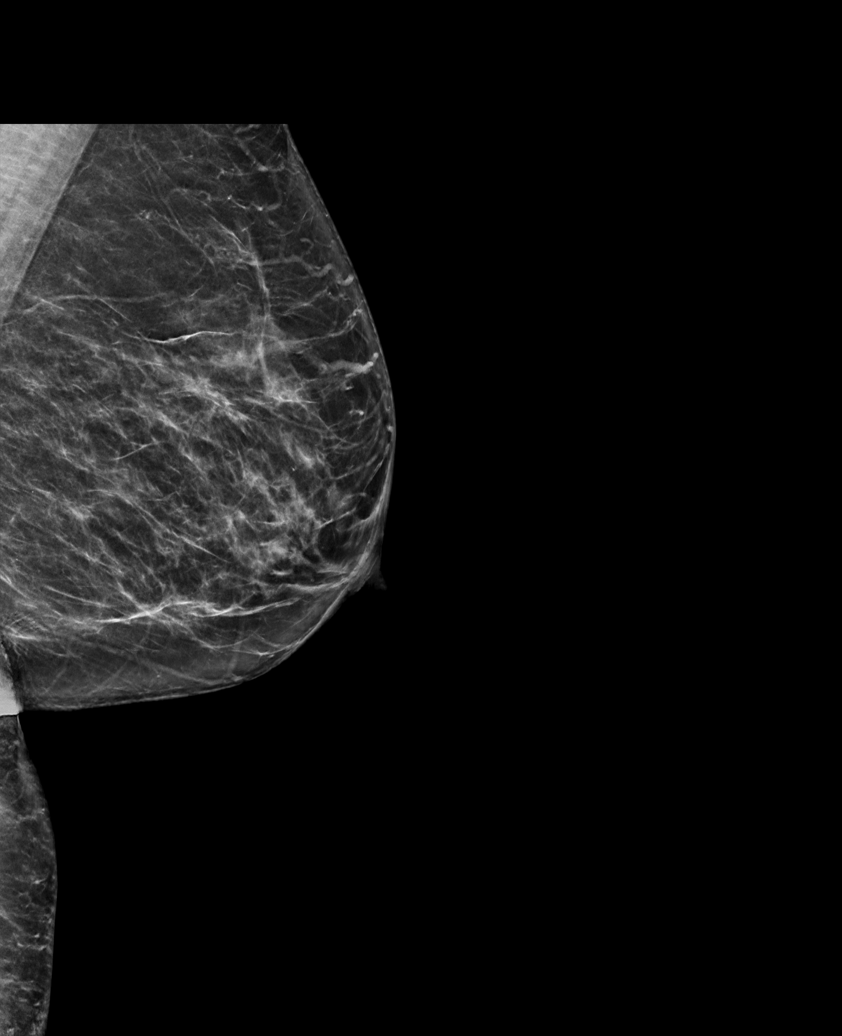

[R MLO synth-2D]
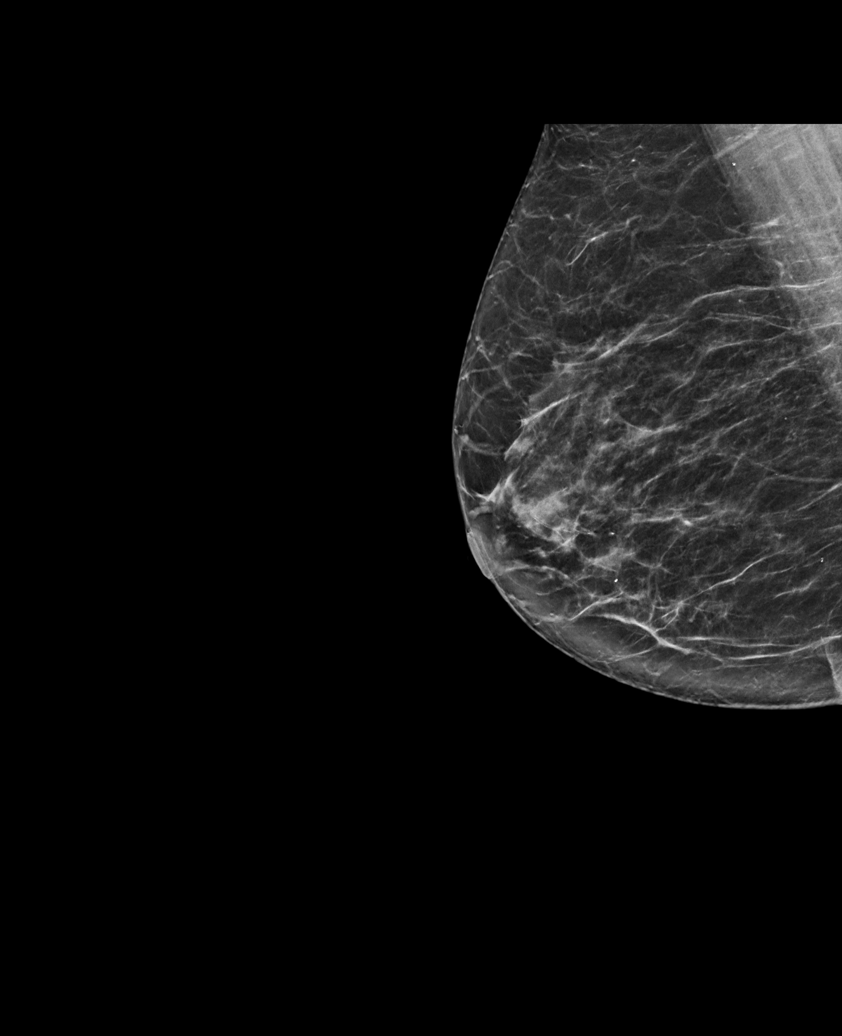

[L CC synth-2D]
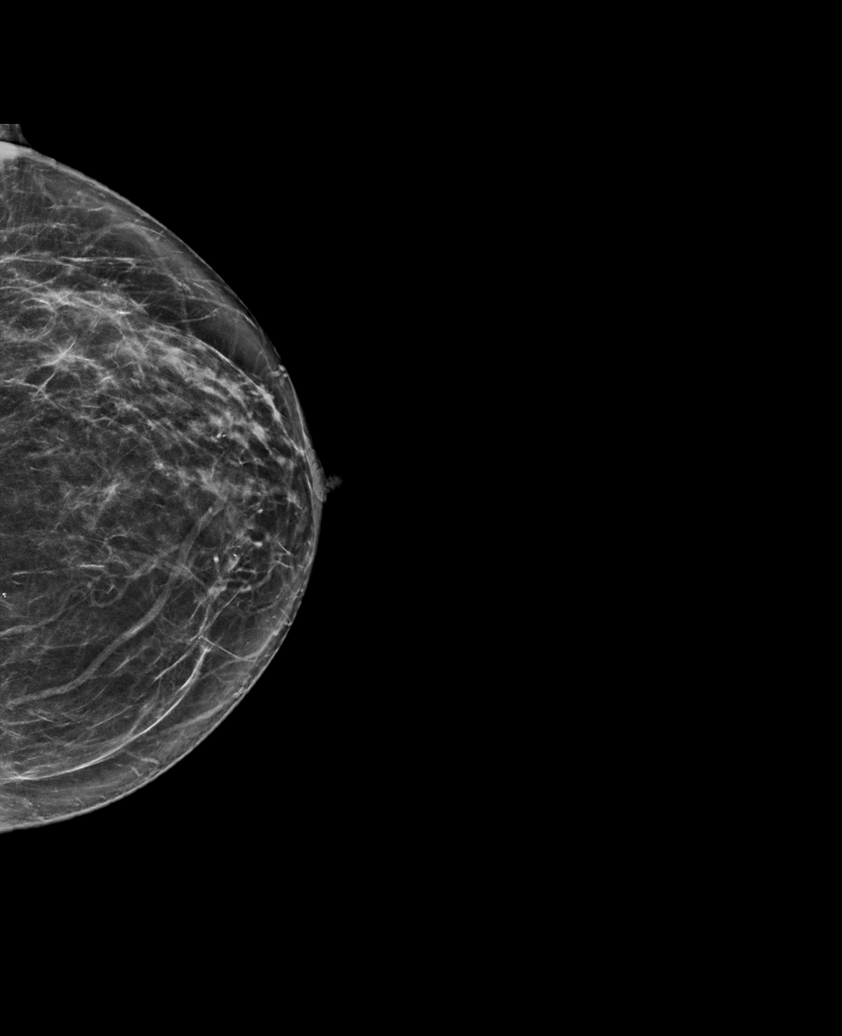

[R CC tomo · tomo slice 35/68.0]
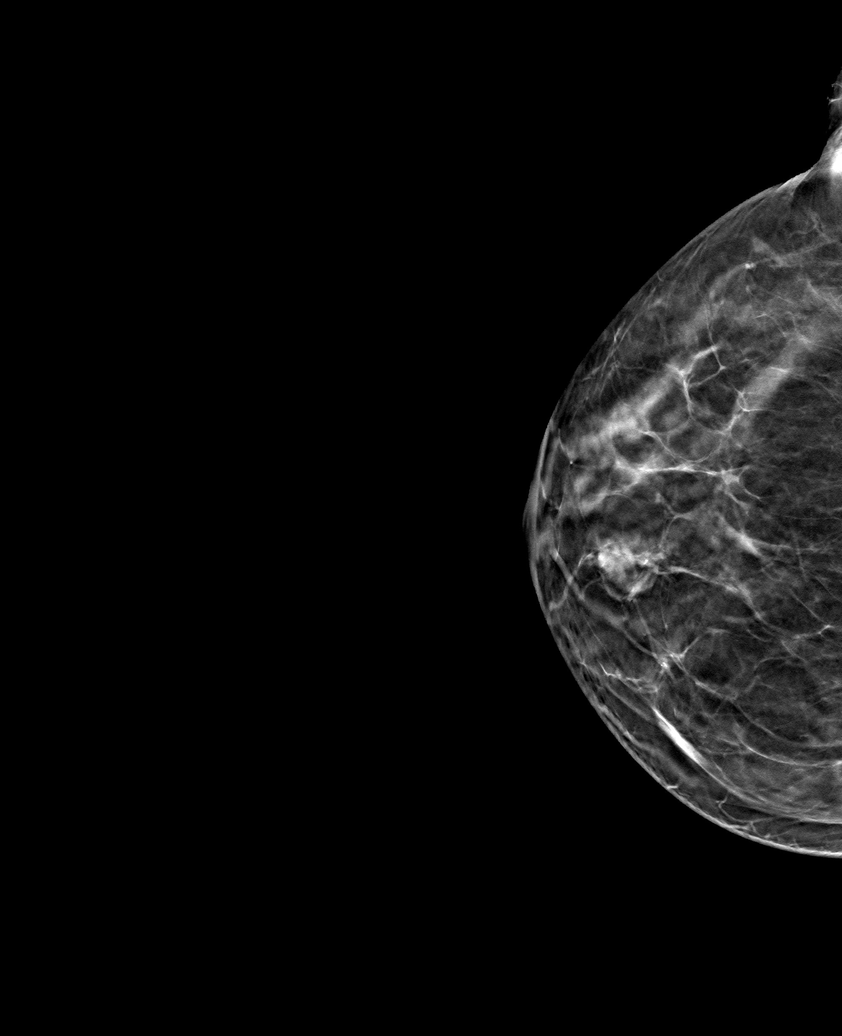

[6 of 30 positions shown; findings below may reference images not displayed]

ACR Breast Density Category b: There are scattered areas of
fibroglandular density.
FINDINGS: There are no findings suspicious for malignancy.
IMPRESSION: No mammographic evidence of malignancy. A result letter of this
screening mammogram will be mailed directly to the patient.

RECOMMENDATION:
Screening mammogram in one year. (Code:51-O-LD2)

BI-RADS CATEGORY  1: Negative.

## 2022-06-04 DIAGNOSIS — N186 End stage renal disease: Secondary | ICD-10-CM | POA: Diagnosis not present

## 2022-06-04 DIAGNOSIS — D631 Anemia in chronic kidney disease: Secondary | ICD-10-CM | POA: Diagnosis not present

## 2022-06-04 DIAGNOSIS — N2581 Secondary hyperparathyroidism of renal origin: Secondary | ICD-10-CM | POA: Diagnosis not present

## 2022-06-06 DIAGNOSIS — N2581 Secondary hyperparathyroidism of renal origin: Secondary | ICD-10-CM | POA: Diagnosis not present

## 2022-06-06 DIAGNOSIS — N186 End stage renal disease: Secondary | ICD-10-CM | POA: Diagnosis not present

## 2022-06-06 DIAGNOSIS — D631 Anemia in chronic kidney disease: Secondary | ICD-10-CM | POA: Diagnosis not present

## 2022-06-09 DIAGNOSIS — N186 End stage renal disease: Secondary | ICD-10-CM | POA: Diagnosis not present

## 2022-06-09 DIAGNOSIS — D631 Anemia in chronic kidney disease: Secondary | ICD-10-CM | POA: Diagnosis not present

## 2022-06-09 DIAGNOSIS — N2581 Secondary hyperparathyroidism of renal origin: Secondary | ICD-10-CM | POA: Diagnosis not present

## 2022-06-10 ENCOUNTER — Ambulatory Visit: Payer: Medicare PPO

## 2022-06-10 ENCOUNTER — Ambulatory Visit
Admission: RE | Admit: 2022-06-10 | Discharge: 2022-06-10 | Disposition: A | Discharge status: Home / Self Care | Payer: Medicare PPO | Source: Ambulatory Visit | Attending: Internal Medicine | Admitting: Internal Medicine

## 2022-06-10 DIAGNOSIS — Z1231 Encounter for screening mammogram for malignant neoplasm of breast: Secondary | ICD-10-CM

## 2022-06-11 DIAGNOSIS — N186 End stage renal disease: Secondary | ICD-10-CM | POA: Diagnosis not present

## 2022-06-11 DIAGNOSIS — D631 Anemia in chronic kidney disease: Secondary | ICD-10-CM | POA: Diagnosis not present

## 2022-06-11 DIAGNOSIS — N2581 Secondary hyperparathyroidism of renal origin: Secondary | ICD-10-CM | POA: Diagnosis not present

## 2022-06-13 DIAGNOSIS — N2581 Secondary hyperparathyroidism of renal origin: Secondary | ICD-10-CM | POA: Diagnosis not present

## 2022-06-13 DIAGNOSIS — D631 Anemia in chronic kidney disease: Secondary | ICD-10-CM | POA: Diagnosis not present

## 2022-06-13 DIAGNOSIS — N186 End stage renal disease: Secondary | ICD-10-CM | POA: Diagnosis not present

## 2022-06-15 DIAGNOSIS — D631 Anemia in chronic kidney disease: Secondary | ICD-10-CM | POA: Diagnosis not present

## 2022-06-15 DIAGNOSIS — N2581 Secondary hyperparathyroidism of renal origin: Secondary | ICD-10-CM | POA: Diagnosis not present

## 2022-06-15 DIAGNOSIS — N186 End stage renal disease: Secondary | ICD-10-CM | POA: Diagnosis not present

## 2022-06-17 DIAGNOSIS — D631 Anemia in chronic kidney disease: Secondary | ICD-10-CM | POA: Diagnosis not present

## 2022-06-17 DIAGNOSIS — N186 End stage renal disease: Secondary | ICD-10-CM | POA: Diagnosis not present

## 2022-06-17 DIAGNOSIS — N2581 Secondary hyperparathyroidism of renal origin: Secondary | ICD-10-CM | POA: Diagnosis not present

## 2022-06-20 DIAGNOSIS — N186 End stage renal disease: Secondary | ICD-10-CM | POA: Diagnosis not present

## 2022-06-20 DIAGNOSIS — D631 Anemia in chronic kidney disease: Secondary | ICD-10-CM | POA: Diagnosis not present

## 2022-06-20 DIAGNOSIS — N2581 Secondary hyperparathyroidism of renal origin: Secondary | ICD-10-CM | POA: Diagnosis not present

## 2022-06-23 DIAGNOSIS — N186 End stage renal disease: Secondary | ICD-10-CM | POA: Diagnosis not present

## 2022-06-23 DIAGNOSIS — N2581 Secondary hyperparathyroidism of renal origin: Secondary | ICD-10-CM | POA: Diagnosis not present

## 2022-06-23 DIAGNOSIS — D631 Anemia in chronic kidney disease: Secondary | ICD-10-CM | POA: Diagnosis not present

## 2022-06-25 DIAGNOSIS — D631 Anemia in chronic kidney disease: Secondary | ICD-10-CM | POA: Diagnosis not present

## 2022-06-25 DIAGNOSIS — N186 End stage renal disease: Secondary | ICD-10-CM | POA: Diagnosis not present

## 2022-06-25 DIAGNOSIS — N2581 Secondary hyperparathyroidism of renal origin: Secondary | ICD-10-CM | POA: Diagnosis not present

## 2022-06-27 DIAGNOSIS — I10 Essential (primary) hypertension: Secondary | ICD-10-CM | POA: Diagnosis not present

## 2022-06-27 DIAGNOSIS — D509 Iron deficiency anemia, unspecified: Secondary | ICD-10-CM | POA: Diagnosis not present

## 2022-06-27 DIAGNOSIS — D631 Anemia in chronic kidney disease: Secondary | ICD-10-CM | POA: Diagnosis not present

## 2022-06-27 DIAGNOSIS — E8779 Other fluid overload: Secondary | ICD-10-CM | POA: Diagnosis not present

## 2022-06-27 DIAGNOSIS — N186 End stage renal disease: Secondary | ICD-10-CM | POA: Diagnosis not present

## 2022-06-27 DIAGNOSIS — N2581 Secondary hyperparathyroidism of renal origin: Secondary | ICD-10-CM | POA: Diagnosis not present

## 2022-06-28 DIAGNOSIS — D509 Iron deficiency anemia, unspecified: Secondary | ICD-10-CM | POA: Diagnosis not present

## 2022-06-28 DIAGNOSIS — D631 Anemia in chronic kidney disease: Secondary | ICD-10-CM | POA: Diagnosis not present

## 2022-06-28 DIAGNOSIS — N186 End stage renal disease: Secondary | ICD-10-CM | POA: Diagnosis not present

## 2022-06-28 DIAGNOSIS — E8779 Other fluid overload: Secondary | ICD-10-CM | POA: Diagnosis not present

## 2022-06-28 DIAGNOSIS — N2581 Secondary hyperparathyroidism of renal origin: Secondary | ICD-10-CM | POA: Diagnosis not present

## 2022-06-30 DIAGNOSIS — D509 Iron deficiency anemia, unspecified: Secondary | ICD-10-CM | POA: Diagnosis not present

## 2022-06-30 DIAGNOSIS — N186 End stage renal disease: Secondary | ICD-10-CM | POA: Diagnosis not present

## 2022-06-30 DIAGNOSIS — E8779 Other fluid overload: Secondary | ICD-10-CM | POA: Diagnosis not present

## 2022-06-30 DIAGNOSIS — D631 Anemia in chronic kidney disease: Secondary | ICD-10-CM | POA: Diagnosis not present

## 2022-06-30 DIAGNOSIS — N2581 Secondary hyperparathyroidism of renal origin: Secondary | ICD-10-CM | POA: Diagnosis not present

## 2022-07-02 ENCOUNTER — Other Ambulatory Visit: Payer: Self-pay

## 2022-07-02 DIAGNOSIS — N2581 Secondary hyperparathyroidism of renal origin: Secondary | ICD-10-CM | POA: Diagnosis not present

## 2022-07-02 DIAGNOSIS — D509 Iron deficiency anemia, unspecified: Secondary | ICD-10-CM | POA: Diagnosis not present

## 2022-07-02 DIAGNOSIS — E8779 Other fluid overload: Secondary | ICD-10-CM | POA: Diagnosis not present

## 2022-07-02 DIAGNOSIS — E119 Type 2 diabetes mellitus without complications: Secondary | ICD-10-CM | POA: Diagnosis not present

## 2022-07-02 DIAGNOSIS — D631 Anemia in chronic kidney disease: Secondary | ICD-10-CM | POA: Diagnosis not present

## 2022-07-02 DIAGNOSIS — N186 End stage renal disease: Secondary | ICD-10-CM | POA: Diagnosis not present

## 2022-07-02 MED ORDER — LEVOTHYROXINE SODIUM 88 MCG PO TABS: 88.0000 ug | ORAL_TABLET | Freq: Every day | ORAL | 0 refills | Status: DC

## 2022-07-04 DIAGNOSIS — D509 Iron deficiency anemia, unspecified: Secondary | ICD-10-CM | POA: Diagnosis not present

## 2022-07-04 DIAGNOSIS — D631 Anemia in chronic kidney disease: Secondary | ICD-10-CM | POA: Diagnosis not present

## 2022-07-04 DIAGNOSIS — N186 End stage renal disease: Secondary | ICD-10-CM | POA: Diagnosis not present

## 2022-07-04 DIAGNOSIS — E8779 Other fluid overload: Secondary | ICD-10-CM | POA: Diagnosis not present

## 2022-07-04 DIAGNOSIS — N2581 Secondary hyperparathyroidism of renal origin: Secondary | ICD-10-CM | POA: Diagnosis not present

## 2022-07-07 DIAGNOSIS — D509 Iron deficiency anemia, unspecified: Secondary | ICD-10-CM | POA: Diagnosis not present

## 2022-07-07 DIAGNOSIS — D631 Anemia in chronic kidney disease: Secondary | ICD-10-CM | POA: Diagnosis not present

## 2022-07-07 DIAGNOSIS — E8779 Other fluid overload: Secondary | ICD-10-CM | POA: Diagnosis not present

## 2022-07-07 DIAGNOSIS — N2581 Secondary hyperparathyroidism of renal origin: Secondary | ICD-10-CM | POA: Diagnosis not present

## 2022-07-07 DIAGNOSIS — N186 End stage renal disease: Secondary | ICD-10-CM | POA: Diagnosis not present

## 2022-07-08 ENCOUNTER — Other Ambulatory Visit: Payer: Self-pay | Admitting: Internal Medicine

## 2022-07-08 LAB — HM DIABETES EYE EXAM

## 2022-07-09 DIAGNOSIS — E8779 Other fluid overload: Secondary | ICD-10-CM | POA: Diagnosis not present

## 2022-07-09 DIAGNOSIS — D631 Anemia in chronic kidney disease: Secondary | ICD-10-CM | POA: Diagnosis not present

## 2022-07-09 DIAGNOSIS — D509 Iron deficiency anemia, unspecified: Secondary | ICD-10-CM | POA: Diagnosis not present

## 2022-07-09 DIAGNOSIS — N186 End stage renal disease: Secondary | ICD-10-CM | POA: Diagnosis not present

## 2022-07-09 DIAGNOSIS — N2581 Secondary hyperparathyroidism of renal origin: Secondary | ICD-10-CM | POA: Diagnosis not present

## 2022-07-11 DIAGNOSIS — E8779 Other fluid overload: Secondary | ICD-10-CM | POA: Diagnosis not present

## 2022-07-11 DIAGNOSIS — N186 End stage renal disease: Secondary | ICD-10-CM | POA: Diagnosis not present

## 2022-07-11 DIAGNOSIS — D631 Anemia in chronic kidney disease: Secondary | ICD-10-CM | POA: Diagnosis not present

## 2022-07-11 DIAGNOSIS — D509 Iron deficiency anemia, unspecified: Secondary | ICD-10-CM | POA: Diagnosis not present

## 2022-07-11 DIAGNOSIS — N2581 Secondary hyperparathyroidism of renal origin: Secondary | ICD-10-CM | POA: Diagnosis not present

## 2022-07-14 DIAGNOSIS — D631 Anemia in chronic kidney disease: Secondary | ICD-10-CM | POA: Diagnosis not present

## 2022-07-14 DIAGNOSIS — D509 Iron deficiency anemia, unspecified: Secondary | ICD-10-CM | POA: Diagnosis not present

## 2022-07-14 DIAGNOSIS — N2581 Secondary hyperparathyroidism of renal origin: Secondary | ICD-10-CM | POA: Diagnosis not present

## 2022-07-14 DIAGNOSIS — E8779 Other fluid overload: Secondary | ICD-10-CM | POA: Diagnosis not present

## 2022-07-14 DIAGNOSIS — N186 End stage renal disease: Secondary | ICD-10-CM | POA: Diagnosis not present

## 2022-07-16 DIAGNOSIS — N2581 Secondary hyperparathyroidism of renal origin: Secondary | ICD-10-CM | POA: Diagnosis not present

## 2022-07-16 DIAGNOSIS — D509 Iron deficiency anemia, unspecified: Secondary | ICD-10-CM | POA: Diagnosis not present

## 2022-07-16 DIAGNOSIS — D631 Anemia in chronic kidney disease: Secondary | ICD-10-CM | POA: Diagnosis not present

## 2022-07-16 DIAGNOSIS — E8779 Other fluid overload: Secondary | ICD-10-CM | POA: Diagnosis not present

## 2022-07-16 DIAGNOSIS — N186 End stage renal disease: Secondary | ICD-10-CM | POA: Diagnosis not present

## 2022-07-18 DIAGNOSIS — N186 End stage renal disease: Secondary | ICD-10-CM | POA: Diagnosis not present

## 2022-07-18 DIAGNOSIS — D509 Iron deficiency anemia, unspecified: Secondary | ICD-10-CM | POA: Diagnosis not present

## 2022-07-18 DIAGNOSIS — E8779 Other fluid overload: Secondary | ICD-10-CM | POA: Diagnosis not present

## 2022-07-18 DIAGNOSIS — D631 Anemia in chronic kidney disease: Secondary | ICD-10-CM | POA: Diagnosis not present

## 2022-07-18 DIAGNOSIS — N2581 Secondary hyperparathyroidism of renal origin: Secondary | ICD-10-CM | POA: Diagnosis not present

## 2022-07-20 DIAGNOSIS — N186 End stage renal disease: Secondary | ICD-10-CM | POA: Diagnosis not present

## 2022-07-20 DIAGNOSIS — N2581 Secondary hyperparathyroidism of renal origin: Secondary | ICD-10-CM | POA: Diagnosis not present

## 2022-07-20 DIAGNOSIS — E8779 Other fluid overload: Secondary | ICD-10-CM | POA: Diagnosis not present

## 2022-07-20 DIAGNOSIS — D631 Anemia in chronic kidney disease: Secondary | ICD-10-CM | POA: Diagnosis not present

## 2022-07-20 DIAGNOSIS — D509 Iron deficiency anemia, unspecified: Secondary | ICD-10-CM | POA: Diagnosis not present

## 2022-07-23 ENCOUNTER — Ambulatory Visit: Payer: BC Managed Care – PPO

## 2022-07-23 DIAGNOSIS — N2581 Secondary hyperparathyroidism of renal origin: Secondary | ICD-10-CM | POA: Diagnosis not present

## 2022-07-23 DIAGNOSIS — D631 Anemia in chronic kidney disease: Secondary | ICD-10-CM | POA: Diagnosis not present

## 2022-07-23 DIAGNOSIS — N186 End stage renal disease: Secondary | ICD-10-CM | POA: Diagnosis not present

## 2022-07-23 DIAGNOSIS — D509 Iron deficiency anemia, unspecified: Secondary | ICD-10-CM | POA: Diagnosis not present

## 2022-07-23 DIAGNOSIS — E8779 Other fluid overload: Secondary | ICD-10-CM | POA: Diagnosis not present

## 2022-07-24 ENCOUNTER — Ambulatory Visit (INDEPENDENT_AMBULATORY_CARE_PROVIDER_SITE_OTHER): Payer: Medicare PPO

## 2022-07-24 VITALS — Ht 67.0 in | Wt 220.0 lb

## 2022-07-24 DIAGNOSIS — Z0000 Encounter for general adult medical examination without abnormal findings: Secondary | ICD-10-CM

## 2022-07-24 NOTE — Patient Instructions (Signed)
Jennifer Payne , Thank you for taking time to come for your Medicare Wellness Visit. I appreciate your ongoing commitment to your health goals. Please review the following plan we discussed and let me know if I can assist you in the future.   These are the goals we discussed:  Goals       I would like to lower my A1c (pt-stated)      Care Coordination Interventions: Provided education to patient about basic DM disease process Review of patient status, including review of consultants reports, relevant laboratory and other test results, and medications completed Determined patient has not started insulin, she prefers to change her diet, she will start using the plate method, educated on portion control Reiterated staying active establishing routine exercise regimen, 30 minutes daily as tolerated  Educated on importance of monitoring blood sugars at home in order to learn how her body is responding to the foods she eats and before/after exercise Determined patient declines wanting to use a Libre sensor due to having dialysis access and having frequent blood pressure checks during treatments [employer] mailed printed educational materials related to diabetes management       Patient Stated      02/23/2019, to make an appointment for the dentist      Patient Stated (pt-stated)      Current Barriers:  Diabetes: T2DM; most recent A1c 9.9% on 02/23/19.  A1c has increased since 05/2018 when it was 7.4 Current antihyperglycemic regimen: Levemir 20 units (will be switching to [place]); AccuCheck glucometer Denies hypoglycemic symptoms; Denies hyperglycemic symptoms Current exercise: walking as able Current blood glucose readings: FBG 150-200 Cardiovascular risk reduction: Current hypertensive/CHF regimen: amlodipine '5mg'$ , metoprolol XL '100mg'$ , hydralazine '25mg'$  TID; torsemide, metolazone Current hyperlipidemia regimen: atorvastatin '80mg'$  daily  Pharmacist Clinical Goal(s):  Over the next 90 days, patient with  work with PharmD and primary care provider to address optimization of medication management of chronic conditions  Interventions: Face to face clinic visit completed with Ms. Mapp on 02/23/19 Comprehensive medication review performed.  Reviewed medication fill history via insurance claims data confirming patient appears compliant with having his medications filled on time as prescribed by provider. Reviewed & discussed the following diabetes-related information with patient: Continue checking blood sugars as directed Follow ADA recommended "diabetes-friendly" diet  (reviewed healthy snack/food options) Discussed insulin injection technique Reviewed medication purpose/side effects Encouraged patient to continue taking all medications as prescribed by provider   Patient Self Care Activities:  Patient will check blood glucose daily in the AM , document, and provide at future appointments Patient will focus on medication adherence by continuing to take all medications as prescribed; blood sugars at goal  Patient will take medications as prescribed Patient will contact provider with any episodes of hypoglycemia Patient will report any questions or concerns to provider   Initial goal documentation       Patient Stated      05/03/2020, wants to lose 25 pounds      Patient Stated      06/19/2021, lose weight      Patient Stated      07/24/2022, not to gain any weight        This is a list of the screening recommended for you and due dates:  Health Maintenance  Topic Date Due   COVID-19 Vaccine (4 - 2023-24 season) 03/28/2022   Eye exam for diabetics  05/28/2022   Hemoglobin A1C  10/16/2022   Complete foot exam   04/18/2023   DTaP/Tdap/Td  vaccine (2 - Tdap) 06/14/2023   Medicare Annual Wellness Visit  07/25/2023   Colon Cancer Screening  05/24/2024   Mammogram  06/10/2024   Pneumonia Vaccine  Completed   Flu Shot  Completed   DEXA scan (bone density measurement)  Completed    Hepatitis C Screening: USPSTF Recommendation to screen - Ages 47-79 yo.  Completed   Zoster (Shingles) Vaccine  Completed   HPV Vaccine  Aged Out    Advanced directives: Please bring a copy of your POA (Power of [place]) and/or Living Will to your next appointment.   Conditions/risks identified: none  Next appointment: Follow up in one year for your annual wellness visit    Preventive Care 65 Years and Older, Female Preventive care refers to lifestyle choices and visits with your health care provider that can promote health and wellness. What does preventive care include? A yearly physical exam. This is also called an annual well check. Dental exams once or twice a year. Routine eye exams. Ask your health care provider how often you should have your eyes checked. Personal lifestyle choices, including: Daily care of your teeth and gums. Regular physical activity. Eating a healthy diet. Avoiding tobacco and drug use. Limiting alcohol use. Practicing safe sex. Taking low-dose aspirin every day. Taking vitamin and mineral supplements as recommended by your health care provider. What happens during an annual well check? The services and screenings done by your health care provider during your annual well check will depend on your age, overall health, lifestyle risk factors, and family history of disease. Counseling  Your health care provider may ask you questions about your: Alcohol use. Tobacco use. Drug use. Emotional well-being. Home and relationship well-being. Sexual activity. Eating habits. History of falls. Memory and ability to understand (cognition). Work and work Statistician. Reproductive health. Screening  You may have the following tests or measurements: Height, weight, and BMI. Blood pressure. Lipid and cholesterol levels. These may be checked every 5 years, or more frequently if you are over 61 years old. Skin check. Lung cancer screening. You may have this  screening every year starting at age 24 if you have a 30-pack-year history of smoking and currently smoke or have quit within the past 15 years. Fecal occult blood test (FOBT) of the stool. You may have this test every year starting at age 62. Flexible sigmoidoscopy or colonoscopy. You may have a sigmoidoscopy every 5 years or a colonoscopy every 10 years starting at age 36. Hepatitis C blood test. Hepatitis B blood test. Sexually transmitted disease (STD) testing. Diabetes screening. This is done by checking your blood sugar (glucose) after you have not eaten for a while (fasting). You may have this done every 1-3 years. Bone density scan. This is done to screen for osteoporosis. You may have this done starting at age 4. Mammogram. This may be done every 1-2 years. Talk to your health care provider about how often you should have regular mammograms. Talk with your health care provider about your test results, treatment options, and if necessary, the need for more tests. Vaccines  Your health care provider may recommend certain vaccines, such as: Influenza vaccine. This is recommended every year. Tetanus, diphtheria, and acellular pertussis (Tdap, Td) vaccine. You may need a Td booster every 10 years. Zoster vaccine. You may need this after age 6. Pneumococcal 13-valent conjugate (PCV13) vaccine. One dose is recommended after age 38. Pneumococcal polysaccharide (PPSV23) vaccine. One dose is recommended after age 78. Talk to your health care  provider about which screenings and vaccines you need and how often you need them. This information is not intended to replace advice given to you by your health care provider. Make sure you discuss any questions you have with your health care provider. Document Released: 08/10/2015 Document Revised: 04/02/2016 Document Reviewed: 05/15/2015 Elsevier Interactive Patient Education  2017 [place] Prevention in the Home Falls can cause injuries.  They can happen to people of all ages. There are many things you can do to make your home safe and to help prevent falls. What can I do on the outside of my home? Regularly fix the edges of walkways and driveways and fix any cracks. Remove anything that might make you trip as you walk through a door, such as a raised step or threshold. Trim any bushes or trees on the path to your home. Use bright outdoor lighting. Clear any walking paths of anything that might make someone trip, such as rocks or tools. Regularly check to see if handrails are loose or broken. Make sure that both sides of any steps have handrails. Any raised decks and porches should have guardrails on the edges. Have any leaves, snow, or ice cleared regularly. Use sand or salt on walking paths during winter. Clean up any spills in your garage right away. This includes oil or grease spills. What can I do in the bathroom? Use night lights. Install grab bars by the toilet and in the tub and shower. Do not use towel bars as grab bars. Use non-skid mats or decals in the tub or shower. If you need to sit down in the shower, use a plastic, non-slip stool. Keep the floor dry. Clean up any water that spills on the floor as soon as it happens. Remove soap buildup in the tub or shower regularly. Attach bath mats securely with double-sided non-slip rug tape. Do not have throw rugs and other things on the floor that can make you trip. What can I do in the bedroom? Use night lights. Make sure that you have a light by your bed that is easy to reach. Do not use any sheets or blankets that are too big for your bed. They should not hang down onto the floor. Have a firm chair that has side arms. You can use this for support while you get dressed. Do not have throw rugs and other things on the floor that can make you trip. What can I do in the kitchen? Clean up any spills right away. Avoid walking on wet floors. Keep items that you use a lot  in easy-to-reach places. If you need to reach something above you, use a strong step stool that has a grab bar. Keep electrical cords out of the way. Do not use floor polish or wax that makes floors slippery. If you must use wax, use non-skid floor wax. Do not have throw rugs and other things on the floor that can make you trip. What can I do with my stairs? Do not leave any items on the stairs. Make sure that there are handrails on both sides of the stairs and use them. Fix handrails that are broken or loose. Make sure that handrails are as long as the stairways. Check any carpeting to make sure that it is firmly attached to the stairs. Fix any carpet that is loose or worn. Avoid having throw rugs at the top or bottom of the stairs. If you do have throw rugs, attach them to the  floor with carpet tape. Make sure that you have a light switch at the top of the stairs and the bottom of the stairs. If you do not have them, ask someone to add them for you. What else can I do to help prevent falls? Wear shoes that: Do not have high heels. Have rubber bottoms. Are comfortable and fit you well. Are closed at the toe. Do not wear sandals. If you use a stepladder: Make sure that it is fully opened. Do not climb a closed stepladder. Make sure that both sides of the stepladder are locked into place. Ask someone to hold it for you, if possible. Clearly mark and make sure that you can see: Any grab bars or handrails. First and last steps. Where the edge of each step is. Use tools that help you move around (mobility aids) if they are needed. These include: Canes. Walkers. Scooters. Crutches. Turn on the lights when you go into a dark area. Replace any light bulbs as soon as they burn out. Set up your furniture so you have a clear path. Avoid moving your furniture around. If any of your floors are uneven, fix them. If there are any pets around you, be aware of where they are. Review your medicines  with your doctor. Some medicines can make you feel dizzy. This can increase your chance of falling. Ask your doctor what other things that you can do to help prevent falls. This information is not intended to replace advice given to you by your health care provider. Make sure you discuss any questions you have with your health care provider. Document Released: 05/10/2009 Document Revised: 12/20/2015 Document Reviewed: 08/18/2014 Elsevier Interactive Patient Education  2017 [employer].

## 2022-07-24 NOTE — Progress Notes (Signed)
I connected with Kellie Murrill today by telephone and verified that I am speaking with the correct person using two identifiers. Location patient: home Location provider: work Persons participating in the virtual visit: Wm, Fruchter LPN.   I discussed the limitations, risks, security and privacy concerns of performing an evaluation and management service by telephone and the availability of in person appointments. I also discussed with the patient that there may be a patient responsible charge related to this service. The patient expressed understanding and verbally consented to this telephonic visit.    Interactive audio and video telecommunications were attempted between this provider and patient, however failed, due to patient having technical difficulties OR patient did not have access to video capability.  We continued and completed visit with audio only.     Vital signs may be patient reported or missing.  Subjective:   Jennifer Payne is a 71 y.o. female who presents for Medicare Annual (Subsequent) preventive examination.  Review of Systems     Cardiac Risk Factors include: advanced age (>43mn, >>19women);diabetes mellitus;hypertension;obesity (BMI >30kg/m2)     Objective:    Today's Vitals   07/24/22 1056  Weight: 220 lb (99.8 kg)  Height: '5\' 7"'$  (1.702 m)   Body mass index is 34.46 kg/m.     07/24/2022   11:04 AM 06/19/2021    8:36 AM 05/03/2020    2:17 PM 02/23/2019    9:58 AM 05/10/2014    1:24 PM  Advanced Directives  Does Patient Have a Medical Advance Directive? Yes Yes Yes No No  Type of AParamedicof ALinds CrossingLiving will [place] will [place] Chart? No - copy requested No - copy requested No - copy requested    Would patient like information on creating a medical advance directive?    Yes (MAU/Ambulatory/Procedural Areas -  Information given)     Current Medications (verified) Outpatient Encounter Medications as of 07/24/2022  Medication Sig   atorvastatin (LIPITOR) 80 MG tablet Take 1 tablet (80 mg total) by mouth daily.   calcium acetate (PHOSLO) 667 MG tablet TAKE 1 TABLET BY MOUTH ONCE DAILY WITH MEALS   levothyroxine (SYNTHROID) 88 MCG tablet Take 1 tablet (88 mcg total) by mouth daily.   sevelamer carbonate (RENVELA) 800 MG tablet TAKE 3 TABLETS BY MOUTH WITH MEALS   benzonatate (TESSALON PERLES) 100 MG capsule Take 1 capsule (100 mg total) by mouth 3 (three) times daily as needed for cough. (Patient not taking: Reported on 07/24/2022)   No facility-administered encounter medications on file as of 07/24/2022.    Allergies (verified) Erythromycin   History: Past Medical History:  Diagnosis Date   Cataract    Diabetes mellitus    Hyperlipidemia    Hypertension    Thyroid disease    Past Surgical History:  Procedure Laterality Date   AV FISTULA PLACEMENT Left 10/2019   CATARACT EXTRACTION     COLONOSCOPY  2009   TONSILLECTOMY  1973   Family History  Problem Relation Age of Onset   Alzheimer's disease Mother    Coronary artery disease Mother    Hyperlipidemia Mother    Obesity Mother    Hypertension Father    Colon cancer Neg Hx    Social History   Socioeconomic History   Marital status: Divorced    Spouse name: Not on file   Number of children: Not on file  Years of education: Not on file   Highest education level: Not on file  Occupational History   Occupation: retired  Tobacco Use   Smoking status: Never   Smokeless tobacco: Never  Vaping Use   Vaping Use: Never used  Substance and Sexual Activity   Alcohol use: No   Drug use: No   Sexual activity: Not Currently  Other Topics Concern   Not on file  Social History Narrative   Not on file   Social Determinants of Health   Financial Resource Strain: Low Risk  (07/24/2022)   Overall Financial Resource Strain  (CARDIA)    Difficulty of Paying Living Expenses: Not hard at all  Food Insecurity: No Food Insecurity (07/24/2022)   Hunger Vital Sign    Worried About Running Out of Food in the Last Year: Never true    [place] in the Last Year: Never true  Transportation Needs: No Transportation Needs (07/24/2022)   PRAPARE - Hydrologist (Medical): No    Lack of Transportation (Non-Medical): No  Physical Activity: Inactive (07/24/2022)   Exercise Vital Sign    Days of Exercise per Week: 0 days    Minutes of Exercise per Session: 0 min  Stress: No Stress Concern Present (07/24/2022)   [place]    Feeling of Stress : Not at all  Social Connections: Not on file    Tobacco Counseling Counseling given: Not Answered   Clinical Intake:  Pre-visit preparation completed: Yes  Pain : No/denies pain     Nutritional Status: BMI > 30  Obese Nutritional Risks: None Diabetes: Yes  How often do you need to have someone help you when you read instructions, pamphlets, or other written materials from your doctor or pharmacy?: 1 - Never  Diabetic? Yes Nutrition Risk Assessment:  Has the patient had any N/V/D within the last 2 months?  No  Does the patient have any non-healing wounds?  No  Has the patient had any unintentional weight loss or weight gain?  No   Diabetes:  Is the patient diabetic?  Yes  If diabetic, was a CBG obtained today?  No  Did the patient bring in their glucometer from home?  No  How often do you monitor your CBG's? Does not.   Financial Strains and Diabetes Management:  Are you having any financial strains with the device, your supplies or your medication? No .  Does the patient want to be seen by Chronic Care Management for management of their diabetes?  No  Would the patient like to be referred to a Nutritionist or for Diabetic Management?  No   Diabetic  Exams:  Diabetic Eye Exam: Overdue for diabetic eye exam. Pt has been advised about the importance in completing this exam. Patient advised to call and schedule an eye exam. Diabetic Foot Exam: Completed 04/17/2022   Interpreter Needed?: No  Information entered by :: NAllen LPN   Activities of Daily Living    07/24/2022   11:06 AM  In your present state of health, do you have any difficulty performing the following activities:  Hearing? 0  Vision? 0  Difficulty concentrating or making decisions? 0  Walking or climbing stairs? 0  Dressing or bathing? 0  Doing errands, shopping? 0  Preparing Food and eating ? N  Using the Toilet? N  In the past six months, have you accidently leaked urine? N  Do you have  problems with loss of bowel control? N  Managing your Medications? N  Managing your Finances? N  Housekeeping or managing your Housekeeping? N    Patient Care Team: Glendale Chard, MD as PCP - General (Internal Medicine)  Indicate any recent Medical Services you may have received from other than [facility] providers in the past year (date may be approximate).     Assessment:   This is a routine wellness examination for Jennifer Payne.  Hearing/Vision screen Vision Screening - Comments:: Regular eye exams, Dr. Baird Cancer, Dr. Sharen Counter  Dietary issues and exercise activities discussed: Current Exercise Habits: The patient does not participate in regular exercise at present   Goals Addressed             This Visit's Progress    Patient Stated       07/24/2022, not to gain any weight       Depression Screen    07/24/2022   11:06 AM 06/19/2021    8:38 AM 05/03/2020    2:18 PM 02/23/2019   10:00 AM 12/30/2018    2:03 PM 08/19/2018   10:19 AM 05/19/2018   10:54 AM  PHQ 2/9 Scores  PHQ - 2 Score 0 0 0 0 0 0 0  PHQ- 9 Score    1       Fall Risk    07/24/2022   11:05 AM 06/19/2021    8:37 AM 05/03/2020    2:17 PM 02/23/2019   10:00 AM 12/30/2018    2:03 PM  [place]  in the past year? 1 0 1 0 0  Comment slipped off bed  tripped on uneven pavement    Number falls in past yr: 0  0    Injury with Fall? 0  0    Risk for fall due to : Medication side effect No Fall Risks History of fall(s);Medication side effect Medication side effect   Follow up Falls evaluation completed;Education provided;Falls prevention discussed Falls evaluation completed;Education provided;Falls prevention discussed Falls evaluation completed;Education provided;Falls prevention discussed Falls evaluation completed;Education provided;Falls prevention discussed     FALL RISK PREVENTION PERTAINING TO THE HOME:  Any stairs in or around the home? No  If so, are there any without handrails? Yes  Home free of loose throw rugs in walkways, pet beds, electrical cords, etc? Yes  Adequate lighting in your home to reduce risk of falls? Yes   ASSISTIVE DEVICES UTILIZED TO PREVENT FALLS:  Life alert? No  Use of a cane, walker or w/c? No  Grab bars in the bathroom? Yes  Shower chair or bench in shower? Yes  Elevated toilet seat or a handicapped toilet? No   TIMED UP AND GO:  Was the test performed? No .       Cognitive Function:        07/24/2022   11:07 AM 06/19/2021    8:39 AM 05/03/2020    2:19 PM 02/23/2019   10:03 AM  6CIT Screen  What Year? 0 points 0 points 0 points 0 points  What month? 0 points 0 points 0 points 0 points  What time? 0 points 0 points 0 points 0 points  Count back from 20 0 points 0 points 0 points 0 points  Months in reverse 0 points 0 points 0 points 0 points  Repeat phrase 0 points 6 points 0 points 0 points  Total Score 0 points 6 points 0 points 0 points    Immunizations Immunization History  Administered Date(s) Administered  DTaP 06/13/2013   Fluad Quad(high Dose 65+) 04/17/2022   Hepatitis A, Adult 08/08/2021   Influenza, High Dose Seasonal PF 05/19/2018, 05/08/2021   Janssen (J&J) SARS-COV-2 Vaccination 10/15/2019   Moderna Sars-Covid-2  Vaccination 10/29/2020, 07/18/2021   PNEUMOCOCCAL CONJUGATE-20 08/08/2021   Zoster Recombinat (Shingrix) 09/28/2021, 11/29/2021    TDAP status: Up to date  Flu Vaccine status: Up to date  Pneumococcal vaccine status: Up to date  Covid-19 vaccine status: Completed vaccines  Qualifies for Shingles Vaccine? Yes   Zostavax completed Yes   Shingrix Completed?: Yes  Screening Tests Health Maintenance  Topic Date Due   COVID-19 Vaccine (4 - 2023-24 season) 03/28/2022   OPHTHALMOLOGY EXAM  05/28/2022   Medicare Annual Wellness (AWV)  06/19/2022   HEMOGLOBIN A1C  10/16/2022   FOOT EXAM  04/18/2023   DTaP/Tdap/Td (2 - Tdap) 06/14/2023   COLONOSCOPY (Pts 45-57yr Insurance coverage will need to be confirmed)  05/24/2024   MAMMOGRAM  06/10/2024   Pneumonia Vaccine 72 Years old  Completed   INFLUENZA VACCINE  Completed   DEXA SCAN  Completed   Hepatitis C Screening  Completed   Zoster Vaccines- Shingrix  Completed   HPV VACCINES  Aged Out    Health Maintenance  Health Maintenance Due  Topic Date Due   COVID-19 Vaccine (4 - 2023-24 season) 03/28/2022   OPHTHALMOLOGY EXAM  05/28/2022   Medicare Annual Wellness (AWV)  06/19/2022    Colorectal cancer screening: Type of screening: Colonoscopy. Completed 05/24/2014. Repeat every 10 years  Mammogram status: Completed 06/10/2022. Repeat every year  Bone Density status: Completed 05/24/2018.  Lung Cancer Screening: (Low Dose CT Chest recommended if Age 72-80years, 30 pack-year currently smoking OR have quit w/in 15years.) does not qualify.   Lung Cancer Screening Referral: no  Additional Screening:  Hepatitis C Screening: does qualify; Completed 11/06/2016  Vision Screening: Recommended annual ophthalmology exams for early detection of glaucoma and other disorders of the eye. Is the patient up to date with their annual eye exam?  Yes  Who is the provider or what is the name of the office in which the patient attends annual eye  exams? Dr. SBaird CancerIf pt is not established with a provider, would they like to be referred to a provider to establish care? No .   Dental Screening: Recommended annual dental exams for proper oral hygiene  Community Resource Referral / Chronic Care Management: CRR required this visit?  No   CCM required this visit?  No      Plan:     I have personally reviewed and noted the following in the patient's chart:   Medical and social history Use of alcohol, tobacco or illicit drugs  Current medications and supplements including opioid prescriptions. Patient is not currently taking opioid prescriptions. Functional ability and status Nutritional status Physical activity Advanced directives List of other physicians Hospitalizations, surgeries, and ER visits in previous 12 months Vitals Screenings to include cognitive, depression, and falls Referrals and appointments  In addition, I have reviewed and discussed with patient certain preventive protocols, quality metrics, and best practice recommendations. A written personalized care plan for preventive services as well as general preventive health recommendations were provided to patient.     NKellie Simmering LPN   [id number]  Nurse Notes: none  Due to this being a virtual visit, the after visit summary with patients personalized plan was offered to patient via mail or my-chart.  to pick up at office at next visit

## 2022-07-25 DIAGNOSIS — N2581 Secondary hyperparathyroidism of renal origin: Secondary | ICD-10-CM | POA: Diagnosis not present

## 2022-07-25 DIAGNOSIS — D509 Iron deficiency anemia, unspecified: Secondary | ICD-10-CM | POA: Diagnosis not present

## 2022-07-25 DIAGNOSIS — D631 Anemia in chronic kidney disease: Secondary | ICD-10-CM | POA: Diagnosis not present

## 2022-07-25 DIAGNOSIS — E8779 Other fluid overload: Secondary | ICD-10-CM | POA: Diagnosis not present

## 2022-07-25 DIAGNOSIS — N186 End stage renal disease: Secondary | ICD-10-CM | POA: Diagnosis not present

## 2022-07-27 DIAGNOSIS — E8779 Other fluid overload: Secondary | ICD-10-CM | POA: Diagnosis not present

## 2022-07-27 DIAGNOSIS — D509 Iron deficiency anemia, unspecified: Secondary | ICD-10-CM | POA: Diagnosis not present

## 2022-07-27 DIAGNOSIS — D631 Anemia in chronic kidney disease: Secondary | ICD-10-CM | POA: Diagnosis not present

## 2022-07-27 DIAGNOSIS — N186 End stage renal disease: Secondary | ICD-10-CM | POA: Diagnosis not present

## 2022-07-27 DIAGNOSIS — N2581 Secondary hyperparathyroidism of renal origin: Secondary | ICD-10-CM | POA: Diagnosis not present

## 2022-07-28 DIAGNOSIS — N186 End stage renal disease: Secondary | ICD-10-CM | POA: Diagnosis not present

## 2022-07-28 DIAGNOSIS — I10 Essential (primary) hypertension: Secondary | ICD-10-CM | POA: Diagnosis not present

## 2022-07-28 DIAGNOSIS — D631 Anemia in chronic kidney disease: Secondary | ICD-10-CM | POA: Diagnosis not present

## 2022-07-30 DIAGNOSIS — N186 End stage renal disease: Secondary | ICD-10-CM | POA: Diagnosis not present

## 2022-07-30 DIAGNOSIS — D509 Iron deficiency anemia, unspecified: Secondary | ICD-10-CM | POA: Diagnosis not present

## 2022-07-30 DIAGNOSIS — N2581 Secondary hyperparathyroidism of renal origin: Secondary | ICD-10-CM | POA: Diagnosis not present

## 2022-07-30 DIAGNOSIS — E119 Type 2 diabetes mellitus without complications: Secondary | ICD-10-CM | POA: Diagnosis not present

## 2022-07-30 DIAGNOSIS — D631 Anemia in chronic kidney disease: Secondary | ICD-10-CM | POA: Diagnosis not present

## 2022-08-01 DIAGNOSIS — N2581 Secondary hyperparathyroidism of renal origin: Secondary | ICD-10-CM | POA: Diagnosis not present

## 2022-08-01 DIAGNOSIS — D631 Anemia in chronic kidney disease: Secondary | ICD-10-CM | POA: Diagnosis not present

## 2022-08-01 DIAGNOSIS — N186 End stage renal disease: Secondary | ICD-10-CM | POA: Diagnosis not present

## 2022-08-01 DIAGNOSIS — D509 Iron deficiency anemia, unspecified: Secondary | ICD-10-CM | POA: Diagnosis not present

## 2022-08-04 DIAGNOSIS — D631 Anemia in chronic kidney disease: Secondary | ICD-10-CM | POA: Diagnosis not present

## 2022-08-04 DIAGNOSIS — D509 Iron deficiency anemia, unspecified: Secondary | ICD-10-CM | POA: Diagnosis not present

## 2022-08-04 DIAGNOSIS — N186 End stage renal disease: Secondary | ICD-10-CM | POA: Diagnosis not present

## 2022-08-04 DIAGNOSIS — N2581 Secondary hyperparathyroidism of renal origin: Secondary | ICD-10-CM | POA: Diagnosis not present

## 2022-08-06 DIAGNOSIS — D631 Anemia in chronic kidney disease: Secondary | ICD-10-CM | POA: Diagnosis not present

## 2022-08-06 DIAGNOSIS — N2581 Secondary hyperparathyroidism of renal origin: Secondary | ICD-10-CM | POA: Diagnosis not present

## 2022-08-06 DIAGNOSIS — D509 Iron deficiency anemia, unspecified: Secondary | ICD-10-CM | POA: Diagnosis not present

## 2022-08-06 DIAGNOSIS — N186 End stage renal disease: Secondary | ICD-10-CM | POA: Diagnosis not present

## 2022-08-08 DIAGNOSIS — D631 Anemia in chronic kidney disease: Secondary | ICD-10-CM | POA: Diagnosis not present

## 2022-08-08 DIAGNOSIS — D509 Iron deficiency anemia, unspecified: Secondary | ICD-10-CM | POA: Diagnosis not present

## 2022-08-08 DIAGNOSIS — N186 End stage renal disease: Secondary | ICD-10-CM | POA: Diagnosis not present

## 2022-08-08 DIAGNOSIS — N2581 Secondary hyperparathyroidism of renal origin: Secondary | ICD-10-CM | POA: Diagnosis not present

## 2022-08-11 DIAGNOSIS — N2581 Secondary hyperparathyroidism of renal origin: Secondary | ICD-10-CM | POA: Diagnosis not present

## 2022-08-11 DIAGNOSIS — D631 Anemia in chronic kidney disease: Secondary | ICD-10-CM | POA: Diagnosis not present

## 2022-08-11 DIAGNOSIS — D509 Iron deficiency anemia, unspecified: Secondary | ICD-10-CM | POA: Diagnosis not present

## 2022-08-11 DIAGNOSIS — N186 End stage renal disease: Secondary | ICD-10-CM | POA: Diagnosis not present

## 2022-08-13 DIAGNOSIS — D631 Anemia in chronic kidney disease: Secondary | ICD-10-CM | POA: Diagnosis not present

## 2022-08-13 DIAGNOSIS — N186 End stage renal disease: Secondary | ICD-10-CM | POA: Diagnosis not present

## 2022-08-13 DIAGNOSIS — N2581 Secondary hyperparathyroidism of renal origin: Secondary | ICD-10-CM | POA: Diagnosis not present

## 2022-08-13 DIAGNOSIS — D509 Iron deficiency anemia, unspecified: Secondary | ICD-10-CM | POA: Diagnosis not present

## 2022-08-15 DIAGNOSIS — N2581 Secondary hyperparathyroidism of renal origin: Secondary | ICD-10-CM | POA: Diagnosis not present

## 2022-08-15 DIAGNOSIS — D509 Iron deficiency anemia, unspecified: Secondary | ICD-10-CM | POA: Diagnosis not present

## 2022-08-15 DIAGNOSIS — N186 End stage renal disease: Secondary | ICD-10-CM | POA: Diagnosis not present

## 2022-08-15 DIAGNOSIS — D631 Anemia in chronic kidney disease: Secondary | ICD-10-CM | POA: Diagnosis not present

## 2022-08-18 DIAGNOSIS — D631 Anemia in chronic kidney disease: Secondary | ICD-10-CM | POA: Diagnosis not present

## 2022-08-18 DIAGNOSIS — D509 Iron deficiency anemia, unspecified: Secondary | ICD-10-CM | POA: Diagnosis not present

## 2022-08-18 DIAGNOSIS — N2581 Secondary hyperparathyroidism of renal origin: Secondary | ICD-10-CM | POA: Diagnosis not present

## 2022-08-18 DIAGNOSIS — N186 End stage renal disease: Secondary | ICD-10-CM | POA: Diagnosis not present

## 2022-08-19 ENCOUNTER — Encounter: Payer: Medicare PPO | Admitting: Internal Medicine

## 2022-08-19 NOTE — Progress Notes (Deleted)
    Subjective:     Patient ID: Jennifer Payne , female    DOB: 05-15-1951 , 72 y.o.   MRN: [id number]   Chief Complaint  Patient presents with   Diabetes   Hypertension   Hypothyroidism    HPI  HPI   Past Medical History:  Diagnosis Date   Cataract    Diabetes mellitus    Hyperlipidemia    Hypertension    Thyroid disease      Family History  Problem Relation Age of Onset   Alzheimer's disease Mother    Coronary artery disease Mother    Hyperlipidemia Mother    Obesity Mother    Hypertension Father    Colon cancer Neg Hx      Current Outpatient Medications:    atorvastatin (LIPITOR) 80 MG tablet, Take 1 tablet (80 mg total) by mouth daily., Disp: 90 tablet, Rfl: 2   benzonatate (TESSALON PERLES) 100 MG capsule, Take 1 capsule (100 mg total) by mouth 3 (three) times daily as needed for cough. (Patient not taking: Reported on 07/24/2022), Disp: 30 capsule, Rfl: 1   calcium acetate (PHOSLO) 667 MG tablet, TAKE 1 TABLET BY MOUTH ONCE DAILY WITH MEALS, Disp: , Rfl:    levothyroxine (SYNTHROID) 88 MCG tablet, Take 1 tablet (88 mcg total) by mouth daily., Disp: 90 tablet, Rfl: 0   sevelamer carbonate (RENVELA) 800 MG tablet, TAKE 3 TABLETS BY MOUTH WITH MEALS, Disp: , Rfl:    Allergies  Allergen Reactions   Erythromycin Rash     Review of Systems   There were no vitals filed for this visit. There is no height or weight on file to calculate BMI.   Objective:  Physical Exam      Assessment And Plan:     1. 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 Pure hypercholesterolemia  3. Primary hypothyroidism     Patient was given opportunity to ask questions. Patient verbalized understanding of the plan and was able to repeat key elements of the plan. All questions were answered to their satisfaction.  Debbora Dus, CMA   I, Debbora Dus, CMA, have reviewed all documentation for this visit. The documentation on 08/19/22 for the  exam, diagnosis, procedures, and orders are all accurate and complete.   IF YOU HAVE BEEN REFERRED TO A SPECIALIST, IT MAY TAKE 1-2 WEEKS TO SCHEDULE/PROCESS THE REFERRAL. IF YOU HAVE NOT HEARD FROM US/SPECIALIST IN TWO WEEKS, PLEASE GIVE [place] A CALL AT [phone] X [phone].   THE PATIENT IS ENCOURAGED TO PRACTICE SOCIAL DISTANCING DUE TO THE COVID-19 PANDEMIC.

## 2022-08-20 DIAGNOSIS — D509 Iron deficiency anemia, unspecified: Secondary | ICD-10-CM | POA: Diagnosis not present

## 2022-08-20 DIAGNOSIS — N2581 Secondary hyperparathyroidism of renal origin: Secondary | ICD-10-CM | POA: Diagnosis not present

## 2022-08-20 DIAGNOSIS — D631 Anemia in chronic kidney disease: Secondary | ICD-10-CM | POA: Diagnosis not present

## 2022-08-20 DIAGNOSIS — N186 End stage renal disease: Secondary | ICD-10-CM | POA: Diagnosis not present

## 2022-08-21 ENCOUNTER — Ambulatory Visit: Payer: Self-pay

## 2022-08-21 ENCOUNTER — Other Ambulatory Visit: Payer: Self-pay | Admitting: Internal Medicine

## 2022-08-21 DIAGNOSIS — Z1231 Encounter for screening mammogram for malignant neoplasm of breast: Secondary | ICD-10-CM

## 2022-08-21 NOTE — Patient Instructions (Signed)
Visit Information  Thank you for taking time to visit with me today. Please don't hesitate to contact me if I can be of assistance to you.   Following are the goals we discussed today:   Goals Addressed               This Visit's Progress     Patient Stated     I would like to lower my A1c (pt-stated)        Care Coordination Interventions: Provided education to patient about basic DM disease process Reviewed medications with patient and discussed importance of medication adherence Review of patient status, including review of consultants reports, relevant laboratory and other test results, and medications completed Counseled on Diabetic diet, my plate method, 517 minutes of moderate intensity exercise/week Blood sugar logs with fasting goals of 80-130 mg/dl, <180 after meals  Educated patient on potential complications related to uncontrolled dm including delayed healing from surgical interventions and or failure to receive renal transplant Educated patient target A1c <7.0 % Instructed patient to contact the [place] office to reschedule her missed f/u with Dr. Baird Cancer for dm and thyroid check Lab Results  Component Value Date   HGBA1C 7.9 (H) 04/17/2022          Our next appointment is by telephone on 10/16/22 at 09:30 AM  Please call the care guide team at [phone] if you need to cancel or reschedule your appointment.   If you are experiencing a Mental Health or [place] or need someone to talk to, please go to [facility] [address], [place] [phone])  Patient verbalizes understanding of instructions and care plan provided today and agrees to view in [place]. Active MyChart status and patient understanding of how to access instructions and care plan via MyChart confirmed with patient.     Barb Merino, RN, BSN, CCM Care Management Coordinator [facility] Care Management Direct Phone: [phone]

## 2022-08-21 NOTE — Patient Outreach (Signed)
  Care Coordination   Follow Up Visit Note   08/21/2022 Name: Jennifer Payne MRN: [id number] DOB: 01-10-1951  Jennifer Payne is a 72 y.o. year old female who sees Glendale Chard, MD for primary care. I spoke with  Neldon Mc by phone today.  What matters to the patients health and wellness today?  Patient will call her PCP to reschedule her missed appointment.    Goals Addressed               This Visit's Progress     Patient Stated     I would like to lower my A1c (pt-stated)        Care Coordination Interventions: Provided education to patient about basic DM disease process Reviewed medications with patient and discussed importance of medication adherence Review of patient status, including review of consultants reports, relevant laboratory and other test results, and medications completed Counseled on Diabetic diet, my plate method, 076 minutes of moderate intensity exercise/week Blood sugar logs with fasting goals of 80-130 mg/dl, <180 after meals  Educated patient on potential complications related to uncontrolled dm including delayed healing from surgical interventions and or failure to receive renal transplant Educated patient target A1c <7.0 % Instructed patient to contact the [place] office to reschedule her missed f/u with Dr. Baird Cancer for dm and thyroid check Lab Results  Component Value Date   HGBA1C 7.9 (H) 04/17/2022          SDOH assessments and interventions completed:  No     Care Coordination Interventions:  Yes, provided   Follow up plan: Follow up call scheduled for 10/16/22 '@09'$ :30 AM    Encounter Outcome:  Pt. Visit Completed

## 2022-08-22 DIAGNOSIS — D631 Anemia in chronic kidney disease: Secondary | ICD-10-CM | POA: Diagnosis not present

## 2022-08-22 DIAGNOSIS — N186 End stage renal disease: Secondary | ICD-10-CM | POA: Diagnosis not present

## 2022-08-22 DIAGNOSIS — D509 Iron deficiency anemia, unspecified: Secondary | ICD-10-CM | POA: Diagnosis not present

## 2022-08-22 DIAGNOSIS — N2581 Secondary hyperparathyroidism of renal origin: Secondary | ICD-10-CM | POA: Diagnosis not present

## 2022-08-24 NOTE — Progress Notes (Signed)
No show

## 2022-08-25 DIAGNOSIS — N2581 Secondary hyperparathyroidism of renal origin: Secondary | ICD-10-CM | POA: Diagnosis not present

## 2022-08-25 DIAGNOSIS — N186 End stage renal disease: Secondary | ICD-10-CM | POA: Diagnosis not present

## 2022-08-25 DIAGNOSIS — D631 Anemia in chronic kidney disease: Secondary | ICD-10-CM | POA: Diagnosis not present

## 2022-08-25 DIAGNOSIS — D509 Iron deficiency anemia, unspecified: Secondary | ICD-10-CM | POA: Diagnosis not present

## 2022-08-27 DIAGNOSIS — N2581 Secondary hyperparathyroidism of renal origin: Secondary | ICD-10-CM | POA: Diagnosis not present

## 2022-08-27 DIAGNOSIS — D509 Iron deficiency anemia, unspecified: Secondary | ICD-10-CM | POA: Diagnosis not present

## 2022-08-27 DIAGNOSIS — D631 Anemia in chronic kidney disease: Secondary | ICD-10-CM | POA: Diagnosis not present

## 2022-08-27 DIAGNOSIS — N186 End stage renal disease: Secondary | ICD-10-CM | POA: Diagnosis not present

## 2022-08-28 DIAGNOSIS — N186 End stage renal disease: Secondary | ICD-10-CM | POA: Diagnosis not present

## 2022-08-28 DIAGNOSIS — D631 Anemia in chronic kidney disease: Secondary | ICD-10-CM | POA: Diagnosis not present

## 2022-08-28 DIAGNOSIS — I10 Essential (primary) hypertension: Secondary | ICD-10-CM | POA: Diagnosis not present

## 2022-08-29 DIAGNOSIS — D631 Anemia in chronic kidney disease: Secondary | ICD-10-CM | POA: Diagnosis not present

## 2022-08-29 DIAGNOSIS — N186 End stage renal disease: Secondary | ICD-10-CM | POA: Diagnosis not present

## 2022-08-29 DIAGNOSIS — N2581 Secondary hyperparathyroidism of renal origin: Secondary | ICD-10-CM | POA: Diagnosis not present

## 2022-08-29 DIAGNOSIS — D509 Iron deficiency anemia, unspecified: Secondary | ICD-10-CM | POA: Diagnosis not present

## 2022-09-02 DIAGNOSIS — N2581 Secondary hyperparathyroidism of renal origin: Secondary | ICD-10-CM | POA: Diagnosis not present

## 2022-09-02 DIAGNOSIS — D631 Anemia in chronic kidney disease: Secondary | ICD-10-CM | POA: Diagnosis not present

## 2022-09-02 DIAGNOSIS — N186 End stage renal disease: Secondary | ICD-10-CM | POA: Diagnosis not present

## 2022-09-02 DIAGNOSIS — D509 Iron deficiency anemia, unspecified: Secondary | ICD-10-CM | POA: Diagnosis not present

## 2022-09-03 DIAGNOSIS — Z114 Encounter for screening for human immunodeficiency virus [HIV]: Secondary | ICD-10-CM | POA: Diagnosis not present

## 2022-09-03 DIAGNOSIS — D509 Iron deficiency anemia, unspecified: Secondary | ICD-10-CM | POA: Diagnosis not present

## 2022-09-03 DIAGNOSIS — N2581 Secondary hyperparathyroidism of renal origin: Secondary | ICD-10-CM | POA: Diagnosis not present

## 2022-09-03 DIAGNOSIS — D631 Anemia in chronic kidney disease: Secondary | ICD-10-CM | POA: Diagnosis not present

## 2022-09-03 DIAGNOSIS — Z1159 Encounter for screening for other viral diseases: Secondary | ICD-10-CM | POA: Diagnosis not present

## 2022-09-03 DIAGNOSIS — E119 Type 2 diabetes mellitus without complications: Secondary | ICD-10-CM | POA: Diagnosis not present

## 2022-09-03 DIAGNOSIS — N186 End stage renal disease: Secondary | ICD-10-CM | POA: Diagnosis not present

## 2022-09-05 DIAGNOSIS — N2581 Secondary hyperparathyroidism of renal origin: Secondary | ICD-10-CM | POA: Diagnosis not present

## 2022-09-05 DIAGNOSIS — N186 End stage renal disease: Secondary | ICD-10-CM | POA: Diagnosis not present

## 2022-09-05 DIAGNOSIS — D631 Anemia in chronic kidney disease: Secondary | ICD-10-CM | POA: Diagnosis not present

## 2022-09-05 DIAGNOSIS — D509 Iron deficiency anemia, unspecified: Secondary | ICD-10-CM | POA: Diagnosis not present

## 2022-09-08 DIAGNOSIS — D631 Anemia in chronic kidney disease: Secondary | ICD-10-CM | POA: Diagnosis not present

## 2022-09-08 DIAGNOSIS — D509 Iron deficiency anemia, unspecified: Secondary | ICD-10-CM | POA: Diagnosis not present

## 2022-09-08 DIAGNOSIS — N186 End stage renal disease: Secondary | ICD-10-CM | POA: Diagnosis not present

## 2022-09-08 DIAGNOSIS — N2581 Secondary hyperparathyroidism of renal origin: Secondary | ICD-10-CM | POA: Diagnosis not present

## 2022-09-09 DIAGNOSIS — H31091 Other chorioretinal scars, right eye: Secondary | ICD-10-CM | POA: Diagnosis not present

## 2022-09-09 DIAGNOSIS — E113513 Type 2 diabetes mellitus with proliferative diabetic retinopathy with macular edema, bilateral: Secondary | ICD-10-CM | POA: Diagnosis not present

## 2022-09-09 LAB — HM DIABETES EYE EXAM

## 2022-09-10 DIAGNOSIS — D631 Anemia in chronic kidney disease: Secondary | ICD-10-CM | POA: Diagnosis not present

## 2022-09-10 DIAGNOSIS — N2581 Secondary hyperparathyroidism of renal origin: Secondary | ICD-10-CM | POA: Diagnosis not present

## 2022-09-10 DIAGNOSIS — D509 Iron deficiency anemia, unspecified: Secondary | ICD-10-CM | POA: Diagnosis not present

## 2022-09-10 DIAGNOSIS — N186 End stage renal disease: Secondary | ICD-10-CM | POA: Diagnosis not present

## 2022-09-12 DIAGNOSIS — N2581 Secondary hyperparathyroidism of renal origin: Secondary | ICD-10-CM | POA: Diagnosis not present

## 2022-09-12 DIAGNOSIS — D631 Anemia in chronic kidney disease: Secondary | ICD-10-CM | POA: Diagnosis not present

## 2022-09-12 DIAGNOSIS — D509 Iron deficiency anemia, unspecified: Secondary | ICD-10-CM | POA: Diagnosis not present

## 2022-09-12 DIAGNOSIS — N186 End stage renal disease: Secondary | ICD-10-CM | POA: Diagnosis not present

## 2022-09-15 DIAGNOSIS — D509 Iron deficiency anemia, unspecified: Secondary | ICD-10-CM | POA: Diagnosis not present

## 2022-09-15 DIAGNOSIS — D631 Anemia in chronic kidney disease: Secondary | ICD-10-CM | POA: Diagnosis not present

## 2022-09-15 DIAGNOSIS — N186 End stage renal disease: Secondary | ICD-10-CM | POA: Diagnosis not present

## 2022-09-15 DIAGNOSIS — N2581 Secondary hyperparathyroidism of renal origin: Secondary | ICD-10-CM | POA: Diagnosis not present

## 2022-09-17 DIAGNOSIS — D631 Anemia in chronic kidney disease: Secondary | ICD-10-CM | POA: Diagnosis not present

## 2022-09-17 DIAGNOSIS — D509 Iron deficiency anemia, unspecified: Secondary | ICD-10-CM | POA: Diagnosis not present

## 2022-09-17 DIAGNOSIS — N186 End stage renal disease: Secondary | ICD-10-CM | POA: Diagnosis not present

## 2022-09-17 DIAGNOSIS — N2581 Secondary hyperparathyroidism of renal origin: Secondary | ICD-10-CM | POA: Diagnosis not present

## 2022-09-19 DIAGNOSIS — D631 Anemia in chronic kidney disease: Secondary | ICD-10-CM | POA: Diagnosis not present

## 2022-09-19 DIAGNOSIS — N186 End stage renal disease: Secondary | ICD-10-CM | POA: Diagnosis not present

## 2022-09-19 DIAGNOSIS — D509 Iron deficiency anemia, unspecified: Secondary | ICD-10-CM | POA: Diagnosis not present

## 2022-09-19 DIAGNOSIS — N2581 Secondary hyperparathyroidism of renal origin: Secondary | ICD-10-CM | POA: Diagnosis not present

## 2022-09-21 DIAGNOSIS — N186 End stage renal disease: Secondary | ICD-10-CM | POA: Diagnosis not present

## 2022-09-21 DIAGNOSIS — M25562 Pain in left knee: Secondary | ICD-10-CM | POA: Diagnosis not present

## 2022-09-21 DIAGNOSIS — M25552 Pain in left hip: Secondary | ICD-10-CM | POA: Diagnosis not present

## 2022-09-21 DIAGNOSIS — M1712 Unilateral primary osteoarthritis, left knee: Secondary | ICD-10-CM | POA: Diagnosis not present

## 2022-09-21 DIAGNOSIS — E1122 Type 2 diabetes mellitus with diabetic chronic kidney disease: Secondary | ICD-10-CM | POA: Diagnosis not present

## 2022-09-21 DIAGNOSIS — Z992 Dependence on renal dialysis: Secondary | ICD-10-CM | POA: Diagnosis not present

## 2022-09-21 DIAGNOSIS — Z8679 Personal history of other diseases of the circulatory system: Secondary | ICD-10-CM | POA: Diagnosis not present

## 2022-09-21 DIAGNOSIS — M1612 Unilateral primary osteoarthritis, left hip: Secondary | ICD-10-CM | POA: Diagnosis not present

## 2022-09-22 ENCOUNTER — Telehealth: Payer: Self-pay

## 2022-09-23 DIAGNOSIS — D509 Iron deficiency anemia, unspecified: Secondary | ICD-10-CM | POA: Diagnosis not present

## 2022-09-23 DIAGNOSIS — D631 Anemia in chronic kidney disease: Secondary | ICD-10-CM | POA: Diagnosis not present

## 2022-09-23 DIAGNOSIS — N2581 Secondary hyperparathyroidism of renal origin: Secondary | ICD-10-CM | POA: Diagnosis not present

## 2022-09-23 DIAGNOSIS — N186 End stage renal disease: Secondary | ICD-10-CM | POA: Diagnosis not present

## 2022-09-24 DIAGNOSIS — N186 End stage renal disease: Secondary | ICD-10-CM | POA: Diagnosis not present

## 2022-09-24 DIAGNOSIS — D631 Anemia in chronic kidney disease: Secondary | ICD-10-CM | POA: Diagnosis not present

## 2022-09-24 DIAGNOSIS — D509 Iron deficiency anemia, unspecified: Secondary | ICD-10-CM | POA: Diagnosis not present

## 2022-09-24 DIAGNOSIS — N2581 Secondary hyperparathyroidism of renal origin: Secondary | ICD-10-CM | POA: Diagnosis not present

## 2022-09-26 DIAGNOSIS — N2581 Secondary hyperparathyroidism of renal origin: Secondary | ICD-10-CM | POA: Diagnosis not present

## 2022-09-26 DIAGNOSIS — N186 End stage renal disease: Secondary | ICD-10-CM | POA: Diagnosis not present

## 2022-09-26 DIAGNOSIS — D509 Iron deficiency anemia, unspecified: Secondary | ICD-10-CM | POA: Diagnosis not present

## 2022-09-26 DIAGNOSIS — D631 Anemia in chronic kidney disease: Secondary | ICD-10-CM | POA: Diagnosis not present

## 2022-09-26 DIAGNOSIS — I10 Essential (primary) hypertension: Secondary | ICD-10-CM | POA: Diagnosis not present

## 2022-09-29 DIAGNOSIS — N186 End stage renal disease: Secondary | ICD-10-CM | POA: Diagnosis not present

## 2022-09-29 DIAGNOSIS — N2581 Secondary hyperparathyroidism of renal origin: Secondary | ICD-10-CM | POA: Diagnosis not present

## 2022-09-29 DIAGNOSIS — D631 Anemia in chronic kidney disease: Secondary | ICD-10-CM | POA: Diagnosis not present

## 2022-09-29 DIAGNOSIS — D509 Iron deficiency anemia, unspecified: Secondary | ICD-10-CM | POA: Diagnosis not present

## 2022-09-30 DIAGNOSIS — E1122 Type 2 diabetes mellitus with diabetic chronic kidney disease: Secondary | ICD-10-CM | POA: Diagnosis not present

## 2022-09-30 DIAGNOSIS — Z7682 Awaiting organ transplant status: Secondary | ICD-10-CM | POA: Diagnosis not present

## 2022-09-30 DIAGNOSIS — N186 End stage renal disease: Secondary | ICD-10-CM | POA: Diagnosis not present

## 2022-09-30 DIAGNOSIS — I120 Hypertensive chronic kidney disease with stage 5 chronic kidney disease or end stage renal disease: Secondary | ICD-10-CM | POA: Diagnosis not present

## 2022-09-30 DIAGNOSIS — Z01818 Encounter for other preprocedural examination: Secondary | ICD-10-CM | POA: Diagnosis not present

## 2022-09-30 DIAGNOSIS — Z992 Dependence on renal dialysis: Secondary | ICD-10-CM | POA: Diagnosis not present

## 2022-10-01 DIAGNOSIS — E119 Type 2 diabetes mellitus without complications: Secondary | ICD-10-CM | POA: Diagnosis not present

## 2022-10-01 DIAGNOSIS — N186 End stage renal disease: Secondary | ICD-10-CM | POA: Diagnosis not present

## 2022-10-01 DIAGNOSIS — D631 Anemia in chronic kidney disease: Secondary | ICD-10-CM | POA: Diagnosis not present

## 2022-10-01 DIAGNOSIS — D509 Iron deficiency anemia, unspecified: Secondary | ICD-10-CM | POA: Diagnosis not present

## 2022-10-01 DIAGNOSIS — N2581 Secondary hyperparathyroidism of renal origin: Secondary | ICD-10-CM | POA: Diagnosis not present

## 2022-10-02 DIAGNOSIS — M5116 Intervertebral disc disorders with radiculopathy, lumbar region: Secondary | ICD-10-CM | POA: Diagnosis not present

## 2022-10-03 DIAGNOSIS — D509 Iron deficiency anemia, unspecified: Secondary | ICD-10-CM | POA: Diagnosis not present

## 2022-10-03 DIAGNOSIS — N2581 Secondary hyperparathyroidism of renal origin: Secondary | ICD-10-CM | POA: Diagnosis not present

## 2022-10-03 DIAGNOSIS — D631 Anemia in chronic kidney disease: Secondary | ICD-10-CM | POA: Diagnosis not present

## 2022-10-03 DIAGNOSIS — N186 End stage renal disease: Secondary | ICD-10-CM | POA: Diagnosis not present

## 2022-10-06 DIAGNOSIS — N2581 Secondary hyperparathyroidism of renal origin: Secondary | ICD-10-CM | POA: Diagnosis not present

## 2022-10-06 DIAGNOSIS — D509 Iron deficiency anemia, unspecified: Secondary | ICD-10-CM | POA: Diagnosis not present

## 2022-10-06 DIAGNOSIS — N186 End stage renal disease: Secondary | ICD-10-CM | POA: Diagnosis not present

## 2022-10-06 DIAGNOSIS — D631 Anemia in chronic kidney disease: Secondary | ICD-10-CM | POA: Diagnosis not present

## 2022-10-08 DIAGNOSIS — N2581 Secondary hyperparathyroidism of renal origin: Secondary | ICD-10-CM | POA: Diagnosis not present

## 2022-10-08 DIAGNOSIS — D631 Anemia in chronic kidney disease: Secondary | ICD-10-CM | POA: Diagnosis not present

## 2022-10-08 DIAGNOSIS — D509 Iron deficiency anemia, unspecified: Secondary | ICD-10-CM | POA: Diagnosis not present

## 2022-10-08 DIAGNOSIS — N186 End stage renal disease: Secondary | ICD-10-CM | POA: Diagnosis not present

## 2022-10-09 ENCOUNTER — Other Ambulatory Visit: Payer: Self-pay | Admitting: Internal Medicine

## 2022-10-10 DIAGNOSIS — N2581 Secondary hyperparathyroidism of renal origin: Secondary | ICD-10-CM | POA: Diagnosis not present

## 2022-10-10 DIAGNOSIS — D509 Iron deficiency anemia, unspecified: Secondary | ICD-10-CM | POA: Diagnosis not present

## 2022-10-10 DIAGNOSIS — D631 Anemia in chronic kidney disease: Secondary | ICD-10-CM | POA: Diagnosis not present

## 2022-10-10 DIAGNOSIS — N186 End stage renal disease: Secondary | ICD-10-CM | POA: Diagnosis not present

## 2022-10-13 DIAGNOSIS — N186 End stage renal disease: Secondary | ICD-10-CM | POA: Diagnosis not present

## 2022-10-13 DIAGNOSIS — D631 Anemia in chronic kidney disease: Secondary | ICD-10-CM | POA: Diagnosis not present

## 2022-10-13 DIAGNOSIS — D509 Iron deficiency anemia, unspecified: Secondary | ICD-10-CM | POA: Diagnosis not present

## 2022-10-13 DIAGNOSIS — N2581 Secondary hyperparathyroidism of renal origin: Secondary | ICD-10-CM | POA: Diagnosis not present

## 2022-10-15 DIAGNOSIS — N186 End stage renal disease: Secondary | ICD-10-CM | POA: Diagnosis not present

## 2022-10-15 DIAGNOSIS — N2581 Secondary hyperparathyroidism of renal origin: Secondary | ICD-10-CM | POA: Diagnosis not present

## 2022-10-15 DIAGNOSIS — D631 Anemia in chronic kidney disease: Secondary | ICD-10-CM | POA: Diagnosis not present

## 2022-10-15 DIAGNOSIS — D509 Iron deficiency anemia, unspecified: Secondary | ICD-10-CM | POA: Diagnosis not present

## 2022-10-16 ENCOUNTER — Ambulatory Visit: Payer: Self-pay

## 2022-10-16 ENCOUNTER — Other Ambulatory Visit: Payer: Self-pay | Admitting: Internal Medicine

## 2022-10-16 NOTE — Patient Outreach (Signed)
  Care Coordination   Follow Up Visit Note   10/16/2022 Name: Jennifer Payne MRN: [id number] DOB: 21-Feb-1951  Jennifer Payne is a 72 y.o. year old female who sees Glendale Chard, MD for primary care. I spoke with  Neldon Mc by phone today.  What matters to the patients health and wellness today?  Patient would like start PT to work on knee strengthening.     Goals Addressed             This Visit's Progress    To start PT for knee strengthening       Care Coordination Interventions: Evaluation of current treatment plan related to impaired physical mobility  and patient's adherence to plan as established by provider Reviewed and discussed recent ED visit for knee pain Determined patient f/u with Dr. Vivianne Master Orthopedic surgeon for further evaluation  Determined patient opted not to receive a Cortisone injection  Discussed patient is wrapping her knees with an ace wrap at night time and applying Voltaren gel  Determined patient was unable to complete her pre-transplant FIT test, she was referred to PT Discussed patient has yet to hear from anyone concerning this treatment  Provided patient with the contact number for pre-transplant nurse Monia Pouch, instructed patient to contact this RN to ask about the referral for PT Counseled on the importance of reporting any/all new or changed pain symptoms or management strategies to pain management provider Discussed use of relaxation techniques and/or diversional activities to assist with pain reduction (distraction, imagery, relaxation, massage, acupressure, TENS, heat, and cold application Reviewed with patient prescribed pharmacological and nonpharmacological pain relief strategies        SDOH assessments and interventions completed:  No     Care Coordination Interventions:  Yes, provided   Follow up plan: Follow up call scheduled for 12/16/22 @10 :30 AM    Encounter Outcome:  Pt. Visit Completed

## 2022-10-16 NOTE — Patient Instructions (Signed)
Visit Information  Thank you for taking time to visit with me today. Please don't hesitate to contact me if I can be of assistance to you.   Following are the goals we discussed today:   Goals Addressed             This Visit's Progress    To start PT for knee strengthening       Care Coordination Interventions: Evaluation of current treatment plan related to impaired physical mobility  and patient's adherence to plan as established by provider Reviewed and discussed recent ED visit for knee pain Determined patient f/u with Dr. Vivianne Master Orthopedic surgeon for further evaluation  Determined patient opted not to receive a Cortisone injection  Discussed patient is wrapping her knees with an ace wrap at night time and applying Voltaren gel  Determined patient was unable to complete her pre-transplant FIT test, she was referred to PT Discussed patient has yet to hear from anyone concerning this treatment  Provided patient with the contact number for pre-transplant nurse Monia Pouch, instructed patient to contact this RN to ask about the referral for PT Counseled on the importance of reporting any/all new or changed pain symptoms or management strategies to pain management provider Discussed use of relaxation techniques and/or diversional activities to assist with pain reduction (distraction, imagery, relaxation, massage, acupressure, TENS, heat, and cold application Reviewed with patient prescribed pharmacological and nonpharmacological pain relief strategies        Our next appointment is by telephone on 12/16/22 at 10:30 AM  Please call the care guide team at [phone] if you need to cancel or reschedule your appointment.   If you are experiencing a Mental Health or [place] or need someone to talk to, please call 1-800-273-TALK (toll free, 24 hour hotline) go to [facility] [address] [place] [phone])  Patient  verbalizes understanding of instructions and care plan provided today and agrees to view in [place]. Active MyChart status and patient understanding of how to access instructions and care plan via MyChart confirmed with patient.     Barb Merino, RN, BSN, CCM Care Management Coordinator [facility] Care Management Direct Phone: [phone]

## 2022-10-17 DIAGNOSIS — D509 Iron deficiency anemia, unspecified: Secondary | ICD-10-CM | POA: Diagnosis not present

## 2022-10-17 DIAGNOSIS — D631 Anemia in chronic kidney disease: Secondary | ICD-10-CM | POA: Diagnosis not present

## 2022-10-17 DIAGNOSIS — N186 End stage renal disease: Secondary | ICD-10-CM | POA: Diagnosis not present

## 2022-10-17 DIAGNOSIS — N2581 Secondary hyperparathyroidism of renal origin: Secondary | ICD-10-CM | POA: Diagnosis not present

## 2022-10-20 DIAGNOSIS — D509 Iron deficiency anemia, unspecified: Secondary | ICD-10-CM | POA: Diagnosis not present

## 2022-10-20 DIAGNOSIS — D631 Anemia in chronic kidney disease: Secondary | ICD-10-CM | POA: Diagnosis not present

## 2022-10-20 DIAGNOSIS — N186 End stage renal disease: Secondary | ICD-10-CM | POA: Diagnosis not present

## 2022-10-20 DIAGNOSIS — N2581 Secondary hyperparathyroidism of renal origin: Secondary | ICD-10-CM | POA: Diagnosis not present

## 2022-10-22 DIAGNOSIS — D509 Iron deficiency anemia, unspecified: Secondary | ICD-10-CM | POA: Diagnosis not present

## 2022-10-22 DIAGNOSIS — D631 Anemia in chronic kidney disease: Secondary | ICD-10-CM | POA: Diagnosis not present

## 2022-10-22 DIAGNOSIS — N186 End stage renal disease: Secondary | ICD-10-CM | POA: Diagnosis not present

## 2022-10-22 DIAGNOSIS — N2581 Secondary hyperparathyroidism of renal origin: Secondary | ICD-10-CM | POA: Diagnosis not present

## 2022-10-24 DIAGNOSIS — D509 Iron deficiency anemia, unspecified: Secondary | ICD-10-CM | POA: Diagnosis not present

## 2022-10-24 DIAGNOSIS — N186 End stage renal disease: Secondary | ICD-10-CM | POA: Diagnosis not present

## 2022-10-24 DIAGNOSIS — N2581 Secondary hyperparathyroidism of renal origin: Secondary | ICD-10-CM | POA: Diagnosis not present

## 2022-10-24 DIAGNOSIS — D631 Anemia in chronic kidney disease: Secondary | ICD-10-CM | POA: Diagnosis not present

## 2022-10-27 DIAGNOSIS — N186 End stage renal disease: Secondary | ICD-10-CM | POA: Diagnosis not present

## 2022-10-27 DIAGNOSIS — D509 Iron deficiency anemia, unspecified: Secondary | ICD-10-CM | POA: Diagnosis not present

## 2022-10-27 DIAGNOSIS — I10 Essential (primary) hypertension: Secondary | ICD-10-CM | POA: Diagnosis not present

## 2022-10-27 DIAGNOSIS — N2581 Secondary hyperparathyroidism of renal origin: Secondary | ICD-10-CM | POA: Diagnosis not present

## 2022-10-27 DIAGNOSIS — D631 Anemia in chronic kidney disease: Secondary | ICD-10-CM | POA: Diagnosis not present

## 2022-10-29 DIAGNOSIS — D631 Anemia in chronic kidney disease: Secondary | ICD-10-CM | POA: Diagnosis not present

## 2022-10-29 DIAGNOSIS — N186 End stage renal disease: Secondary | ICD-10-CM | POA: Diagnosis not present

## 2022-10-29 DIAGNOSIS — E119 Type 2 diabetes mellitus without complications: Secondary | ICD-10-CM | POA: Diagnosis not present

## 2022-10-29 DIAGNOSIS — D509 Iron deficiency anemia, unspecified: Secondary | ICD-10-CM | POA: Diagnosis not present

## 2022-10-29 DIAGNOSIS — N2581 Secondary hyperparathyroidism of renal origin: Secondary | ICD-10-CM | POA: Diagnosis not present

## 2022-10-31 DIAGNOSIS — N2581 Secondary hyperparathyroidism of renal origin: Secondary | ICD-10-CM | POA: Diagnosis not present

## 2022-10-31 DIAGNOSIS — D631 Anemia in chronic kidney disease: Secondary | ICD-10-CM | POA: Diagnosis not present

## 2022-10-31 DIAGNOSIS — D509 Iron deficiency anemia, unspecified: Secondary | ICD-10-CM | POA: Diagnosis not present

## 2022-10-31 DIAGNOSIS — N186 End stage renal disease: Secondary | ICD-10-CM | POA: Diagnosis not present

## 2022-11-03 DIAGNOSIS — N186 End stage renal disease: Secondary | ICD-10-CM | POA: Diagnosis not present

## 2022-11-03 DIAGNOSIS — D631 Anemia in chronic kidney disease: Secondary | ICD-10-CM | POA: Diagnosis not present

## 2022-11-03 DIAGNOSIS — D509 Iron deficiency anemia, unspecified: Secondary | ICD-10-CM | POA: Diagnosis not present

## 2022-11-03 DIAGNOSIS — N2581 Secondary hyperparathyroidism of renal origin: Secondary | ICD-10-CM | POA: Diagnosis not present

## 2022-11-05 DIAGNOSIS — D509 Iron deficiency anemia, unspecified: Secondary | ICD-10-CM | POA: Diagnosis not present

## 2022-11-05 DIAGNOSIS — D631 Anemia in chronic kidney disease: Secondary | ICD-10-CM | POA: Diagnosis not present

## 2022-11-05 DIAGNOSIS — N186 End stage renal disease: Secondary | ICD-10-CM | POA: Diagnosis not present

## 2022-11-05 DIAGNOSIS — N2581 Secondary hyperparathyroidism of renal origin: Secondary | ICD-10-CM | POA: Diagnosis not present

## 2022-11-07 DIAGNOSIS — D509 Iron deficiency anemia, unspecified: Secondary | ICD-10-CM | POA: Diagnosis not present

## 2022-11-07 DIAGNOSIS — D631 Anemia in chronic kidney disease: Secondary | ICD-10-CM | POA: Diagnosis not present

## 2022-11-07 DIAGNOSIS — N186 End stage renal disease: Secondary | ICD-10-CM | POA: Diagnosis not present

## 2022-11-07 DIAGNOSIS — N2581 Secondary hyperparathyroidism of renal origin: Secondary | ICD-10-CM | POA: Diagnosis not present

## 2022-11-10 DIAGNOSIS — D509 Iron deficiency anemia, unspecified: Secondary | ICD-10-CM | POA: Diagnosis not present

## 2022-11-10 DIAGNOSIS — N186 End stage renal disease: Secondary | ICD-10-CM | POA: Diagnosis not present

## 2022-11-10 DIAGNOSIS — D631 Anemia in chronic kidney disease: Secondary | ICD-10-CM | POA: Diagnosis not present

## 2022-11-10 DIAGNOSIS — N2581 Secondary hyperparathyroidism of renal origin: Secondary | ICD-10-CM | POA: Diagnosis not present

## 2022-11-12 DIAGNOSIS — N2581 Secondary hyperparathyroidism of renal origin: Secondary | ICD-10-CM | POA: Diagnosis not present

## 2022-11-12 DIAGNOSIS — D631 Anemia in chronic kidney disease: Secondary | ICD-10-CM | POA: Diagnosis not present

## 2022-11-12 DIAGNOSIS — N186 End stage renal disease: Secondary | ICD-10-CM | POA: Diagnosis not present

## 2022-11-12 DIAGNOSIS — D509 Iron deficiency anemia, unspecified: Secondary | ICD-10-CM | POA: Diagnosis not present

## 2022-11-14 DIAGNOSIS — N186 End stage renal disease: Secondary | ICD-10-CM | POA: Diagnosis not present

## 2022-11-14 DIAGNOSIS — D631 Anemia in chronic kidney disease: Secondary | ICD-10-CM | POA: Diagnosis not present

## 2022-11-14 DIAGNOSIS — N2581 Secondary hyperparathyroidism of renal origin: Secondary | ICD-10-CM | POA: Diagnosis not present

## 2022-11-14 DIAGNOSIS — D509 Iron deficiency anemia, unspecified: Secondary | ICD-10-CM | POA: Diagnosis not present

## 2022-11-17 DIAGNOSIS — D509 Iron deficiency anemia, unspecified: Secondary | ICD-10-CM | POA: Diagnosis not present

## 2022-11-17 DIAGNOSIS — N2581 Secondary hyperparathyroidism of renal origin: Secondary | ICD-10-CM | POA: Diagnosis not present

## 2022-11-17 DIAGNOSIS — D631 Anemia in chronic kidney disease: Secondary | ICD-10-CM | POA: Diagnosis not present

## 2022-11-17 DIAGNOSIS — N186 End stage renal disease: Secondary | ICD-10-CM | POA: Diagnosis not present

## 2022-11-19 DIAGNOSIS — N2581 Secondary hyperparathyroidism of renal origin: Secondary | ICD-10-CM | POA: Diagnosis not present

## 2022-11-19 DIAGNOSIS — D509 Iron deficiency anemia, unspecified: Secondary | ICD-10-CM | POA: Diagnosis not present

## 2022-11-19 DIAGNOSIS — N186 End stage renal disease: Secondary | ICD-10-CM | POA: Diagnosis not present

## 2022-11-19 DIAGNOSIS — D631 Anemia in chronic kidney disease: Secondary | ICD-10-CM | POA: Diagnosis not present

## 2022-11-21 DIAGNOSIS — D631 Anemia in chronic kidney disease: Secondary | ICD-10-CM | POA: Diagnosis not present

## 2022-11-21 DIAGNOSIS — D509 Iron deficiency anemia, unspecified: Secondary | ICD-10-CM | POA: Diagnosis not present

## 2022-11-21 DIAGNOSIS — N2581 Secondary hyperparathyroidism of renal origin: Secondary | ICD-10-CM | POA: Diagnosis not present

## 2022-11-21 DIAGNOSIS — N186 End stage renal disease: Secondary | ICD-10-CM | POA: Diagnosis not present

## 2022-11-24 DIAGNOSIS — D631 Anemia in chronic kidney disease: Secondary | ICD-10-CM | POA: Diagnosis not present

## 2022-11-24 DIAGNOSIS — N186 End stage renal disease: Secondary | ICD-10-CM | POA: Diagnosis not present

## 2022-11-24 DIAGNOSIS — N2581 Secondary hyperparathyroidism of renal origin: Secondary | ICD-10-CM | POA: Diagnosis not present

## 2022-11-24 DIAGNOSIS — D509 Iron deficiency anemia, unspecified: Secondary | ICD-10-CM | POA: Diagnosis not present

## 2022-11-26 DIAGNOSIS — N2581 Secondary hyperparathyroidism of renal origin: Secondary | ICD-10-CM | POA: Diagnosis not present

## 2022-11-26 DIAGNOSIS — D509 Iron deficiency anemia, unspecified: Secondary | ICD-10-CM | POA: Diagnosis not present

## 2022-11-26 DIAGNOSIS — I10 Essential (primary) hypertension: Secondary | ICD-10-CM | POA: Diagnosis not present

## 2022-11-26 DIAGNOSIS — E119 Type 2 diabetes mellitus without complications: Secondary | ICD-10-CM | POA: Diagnosis not present

## 2022-11-26 DIAGNOSIS — N186 End stage renal disease: Secondary | ICD-10-CM | POA: Diagnosis not present

## 2022-11-26 DIAGNOSIS — D631 Anemia in chronic kidney disease: Secondary | ICD-10-CM | POA: Diagnosis not present

## 2022-11-28 DIAGNOSIS — N2581 Secondary hyperparathyroidism of renal origin: Secondary | ICD-10-CM | POA: Diagnosis not present

## 2022-11-28 DIAGNOSIS — N186 End stage renal disease: Secondary | ICD-10-CM | POA: Diagnosis not present

## 2022-11-28 DIAGNOSIS — D509 Iron deficiency anemia, unspecified: Secondary | ICD-10-CM | POA: Diagnosis not present

## 2022-11-28 DIAGNOSIS — D631 Anemia in chronic kidney disease: Secondary | ICD-10-CM | POA: Diagnosis not present

## 2022-12-01 DIAGNOSIS — D509 Iron deficiency anemia, unspecified: Secondary | ICD-10-CM | POA: Diagnosis not present

## 2022-12-01 DIAGNOSIS — D631 Anemia in chronic kidney disease: Secondary | ICD-10-CM | POA: Diagnosis not present

## 2022-12-01 DIAGNOSIS — N186 End stage renal disease: Secondary | ICD-10-CM | POA: Diagnosis not present

## 2022-12-01 DIAGNOSIS — N2581 Secondary hyperparathyroidism of renal origin: Secondary | ICD-10-CM | POA: Diagnosis not present

## 2022-12-03 DIAGNOSIS — D631 Anemia in chronic kidney disease: Secondary | ICD-10-CM | POA: Diagnosis not present

## 2022-12-03 DIAGNOSIS — N2581 Secondary hyperparathyroidism of renal origin: Secondary | ICD-10-CM | POA: Diagnosis not present

## 2022-12-03 DIAGNOSIS — N186 End stage renal disease: Secondary | ICD-10-CM | POA: Diagnosis not present

## 2022-12-03 DIAGNOSIS — D509 Iron deficiency anemia, unspecified: Secondary | ICD-10-CM | POA: Diagnosis not present

## 2022-12-05 DIAGNOSIS — D631 Anemia in chronic kidney disease: Secondary | ICD-10-CM | POA: Diagnosis not present

## 2022-12-05 DIAGNOSIS — D509 Iron deficiency anemia, unspecified: Secondary | ICD-10-CM | POA: Diagnosis not present

## 2022-12-05 DIAGNOSIS — N186 End stage renal disease: Secondary | ICD-10-CM | POA: Diagnosis not present

## 2022-12-05 DIAGNOSIS — N2581 Secondary hyperparathyroidism of renal origin: Secondary | ICD-10-CM | POA: Diagnosis not present

## 2022-12-08 DIAGNOSIS — D631 Anemia in chronic kidney disease: Secondary | ICD-10-CM | POA: Diagnosis not present

## 2022-12-08 DIAGNOSIS — N2581 Secondary hyperparathyroidism of renal origin: Secondary | ICD-10-CM | POA: Diagnosis not present

## 2022-12-08 DIAGNOSIS — D509 Iron deficiency anemia, unspecified: Secondary | ICD-10-CM | POA: Diagnosis not present

## 2022-12-08 DIAGNOSIS — N186 End stage renal disease: Secondary | ICD-10-CM | POA: Diagnosis not present

## 2022-12-08 NOTE — Telephone Encounter (Signed)
error 

## 2022-12-10 DIAGNOSIS — D631 Anemia in chronic kidney disease: Secondary | ICD-10-CM | POA: Diagnosis not present

## 2022-12-10 DIAGNOSIS — N2581 Secondary hyperparathyroidism of renal origin: Secondary | ICD-10-CM | POA: Diagnosis not present

## 2022-12-10 DIAGNOSIS — N186 End stage renal disease: Secondary | ICD-10-CM | POA: Diagnosis not present

## 2022-12-10 DIAGNOSIS — D509 Iron deficiency anemia, unspecified: Secondary | ICD-10-CM | POA: Diagnosis not present

## 2022-12-12 DIAGNOSIS — D631 Anemia in chronic kidney disease: Secondary | ICD-10-CM | POA: Diagnosis not present

## 2022-12-12 DIAGNOSIS — D509 Iron deficiency anemia, unspecified: Secondary | ICD-10-CM | POA: Diagnosis not present

## 2022-12-12 DIAGNOSIS — N186 End stage renal disease: Secondary | ICD-10-CM | POA: Diagnosis not present

## 2022-12-12 DIAGNOSIS — N2581 Secondary hyperparathyroidism of renal origin: Secondary | ICD-10-CM | POA: Diagnosis not present

## 2022-12-15 DIAGNOSIS — N186 End stage renal disease: Secondary | ICD-10-CM | POA: Diagnosis not present

## 2022-12-15 DIAGNOSIS — D509 Iron deficiency anemia, unspecified: Secondary | ICD-10-CM | POA: Diagnosis not present

## 2022-12-15 DIAGNOSIS — N2581 Secondary hyperparathyroidism of renal origin: Secondary | ICD-10-CM | POA: Diagnosis not present

## 2022-12-15 DIAGNOSIS — D631 Anemia in chronic kidney disease: Secondary | ICD-10-CM | POA: Diagnosis not present

## 2022-12-16 ENCOUNTER — Ambulatory Visit: Payer: Self-pay

## 2022-12-16 NOTE — Patient Instructions (Signed)
Visit Information  Thank you for taking time to visit with me today. Please don't hesitate to contact me if I can be of assistance to you.   Following are the goals we discussed today:   Goals Addressed             This Visit's Progress    To start PT for knee strengthening       Care Coordination Interventions: Evaluation of current treatment plan related to impaired physical mobility  and patient's adherence to plan as established by provider Determined patient has yet to hear from a PT provider regarding SOC  Counseled on the importance of reporting any/all new or changed pain symptoms or management strategies to pain management provider Discussed use of relaxation techniques and/or diversional activities to assist with pain reduction (distraction, imagery, relaxation, massage, acupressure, TENS, heat, and cold application Reviewed with patient prescribed pharmacological and nonpharmacological pain relief strategies Placed outbound call to [facility] pre-renal transplant coordinator, Tavia, left a voice message concerning patient's PT, advised patient has yet to hear from a PT provider for start of care, provided my contact number and requested a return call          Our next appointment is by telephone on 01/27/23 at 10:30 AM   Please call the care guide team at [phone] if you need to cancel or reschedule your appointment.   If you are experiencing a Mental Health or Behavioral Health Crisis or need someone to talk to, please call 1-800-273-TALK (toll free, 24 hour hotline) go to [facility] [address], [place] [phone])  Patient verbalizes understanding of instructions and care plan provided today and agrees to view in MyChart. Active MyChart status and patient understanding of how to access instructions and care plan via MyChart confirmed with patient.     Delsa Sale, RN, BSN, CCM Care Management Coordinator [facility] Care  Management Direct Phone: [phone]

## 2022-12-16 NOTE — Patient Outreach (Signed)
  Care Coordination   Follow Up Visit Note   12/16/2022 Name: Jennifer Payne MRN: [id number] DOB: May 02, 1951  Jennifer Payne is a 72 y.o. year old female who sees Dorothyann Peng, MD for primary care. I spoke with  Lennie Odor by phone today.  What matters to the patients health and wellness today?  Patient would like to start outpatient PT to meet her FIT guidelines for renal transplant.     Goals Addressed             This Visit's Progress    To start PT for knee strengthening       Care Coordination Interventions: Evaluation of current treatment plan related to impaired physical mobility  and patient's adherence to plan as established by provider Determined patient has yet to hear from a PT provider regarding SOC  Counseled on the importance of reporting any/all new or changed pain symptoms or management strategies to pain management provider Discussed use of relaxation techniques and/or diversional activities to assist with pain reduction (distraction, imagery, relaxation, massage, acupressure, TENS, heat, and cold application Reviewed with patient prescribed pharmacological and nonpharmacological pain relief strategies Placed outbound call to [facility] pre-renal transplant coordinator, Tavia, left a voice message concerning patient's PT, advised patient has yet to hear from a PT provider for start of care, provided my contact number and requested a return call     Interventions Today    Flowsheet Row Most Recent Value  Chronic Disease   Chronic disease during today's visit Other  [left knee pain]  General Interventions   General Interventions Discussed/Reviewed General Interventions Discussed, General Interventions Reviewed, Doctor Visits, Communication with  Doctor Visits Discussed/Reviewed Doctor Visits Discussed, Doctor Visits Reviewed, PCP, Specialist  Communication with PCP/Specialists  [[facility] Renal transplant coordinator]  Exercise  Interventions   Exercise Discussed/Reviewed Physical Activity, Exercise Reviewed, Exercise Discussed  Physical Activity Discussed/Reviewed Physical Activity Reviewed, Home Exercise Program (HEP), Physical Activity Discussed, Types of exercise  Education Interventions   Education Provided Provided Education  Provided Verbal Education On When to see the doctor, Exercise          SDOH assessments and interventions completed:  No     Care Coordination Interventions:  Yes, provided   Follow up plan: Follow up call scheduled for 01/27/23 @10 :30 AM    Encounter Outcome:  Pt. Visit Completed

## 2022-12-17 DIAGNOSIS — D509 Iron deficiency anemia, unspecified: Secondary | ICD-10-CM | POA: Diagnosis not present

## 2022-12-17 DIAGNOSIS — N186 End stage renal disease: Secondary | ICD-10-CM | POA: Diagnosis not present

## 2022-12-17 DIAGNOSIS — N2581 Secondary hyperparathyroidism of renal origin: Secondary | ICD-10-CM | POA: Diagnosis not present

## 2022-12-17 DIAGNOSIS — D631 Anemia in chronic kidney disease: Secondary | ICD-10-CM | POA: Diagnosis not present

## 2022-12-19 DIAGNOSIS — D631 Anemia in chronic kidney disease: Secondary | ICD-10-CM | POA: Diagnosis not present

## 2022-12-19 DIAGNOSIS — N186 End stage renal disease: Secondary | ICD-10-CM | POA: Diagnosis not present

## 2022-12-19 DIAGNOSIS — D509 Iron deficiency anemia, unspecified: Secondary | ICD-10-CM | POA: Diagnosis not present

## 2022-12-19 DIAGNOSIS — N2581 Secondary hyperparathyroidism of renal origin: Secondary | ICD-10-CM | POA: Diagnosis not present

## 2022-12-22 DIAGNOSIS — D631 Anemia in chronic kidney disease: Secondary | ICD-10-CM | POA: Diagnosis not present

## 2022-12-22 DIAGNOSIS — D509 Iron deficiency anemia, unspecified: Secondary | ICD-10-CM | POA: Diagnosis not present

## 2022-12-22 DIAGNOSIS — N2581 Secondary hyperparathyroidism of renal origin: Secondary | ICD-10-CM | POA: Diagnosis not present

## 2022-12-22 DIAGNOSIS — N186 End stage renal disease: Secondary | ICD-10-CM | POA: Diagnosis not present

## 2022-12-23 ENCOUNTER — Encounter: Payer: Self-pay | Admitting: Internal Medicine

## 2022-12-24 DIAGNOSIS — D631 Anemia in chronic kidney disease: Secondary | ICD-10-CM | POA: Diagnosis not present

## 2022-12-24 DIAGNOSIS — N186 End stage renal disease: Secondary | ICD-10-CM | POA: Diagnosis not present

## 2022-12-24 DIAGNOSIS — D509 Iron deficiency anemia, unspecified: Secondary | ICD-10-CM | POA: Diagnosis not present

## 2022-12-24 DIAGNOSIS — N2581 Secondary hyperparathyroidism of renal origin: Secondary | ICD-10-CM | POA: Diagnosis not present

## 2022-12-26 DIAGNOSIS — D631 Anemia in chronic kidney disease: Secondary | ICD-10-CM | POA: Diagnosis not present

## 2022-12-26 DIAGNOSIS — N186 End stage renal disease: Secondary | ICD-10-CM | POA: Diagnosis not present

## 2022-12-26 DIAGNOSIS — N2581 Secondary hyperparathyroidism of renal origin: Secondary | ICD-10-CM | POA: Diagnosis not present

## 2022-12-26 DIAGNOSIS — D509 Iron deficiency anemia, unspecified: Secondary | ICD-10-CM | POA: Diagnosis not present

## 2022-12-27 DIAGNOSIS — N186 End stage renal disease: Secondary | ICD-10-CM | POA: Diagnosis not present

## 2022-12-27 DIAGNOSIS — D631 Anemia in chronic kidney disease: Secondary | ICD-10-CM | POA: Diagnosis not present

## 2022-12-27 DIAGNOSIS — I10 Essential (primary) hypertension: Secondary | ICD-10-CM | POA: Diagnosis not present

## 2022-12-29 DIAGNOSIS — D509 Iron deficiency anemia, unspecified: Secondary | ICD-10-CM | POA: Diagnosis not present

## 2022-12-29 DIAGNOSIS — D631 Anemia in chronic kidney disease: Secondary | ICD-10-CM | POA: Diagnosis not present

## 2022-12-29 DIAGNOSIS — N186 End stage renal disease: Secondary | ICD-10-CM | POA: Diagnosis not present

## 2022-12-29 DIAGNOSIS — N2581 Secondary hyperparathyroidism of renal origin: Secondary | ICD-10-CM | POA: Diagnosis not present

## 2022-12-31 DIAGNOSIS — E119 Type 2 diabetes mellitus without complications: Secondary | ICD-10-CM | POA: Diagnosis not present

## 2022-12-31 DIAGNOSIS — N186 End stage renal disease: Secondary | ICD-10-CM | POA: Diagnosis not present

## 2022-12-31 DIAGNOSIS — D509 Iron deficiency anemia, unspecified: Secondary | ICD-10-CM | POA: Diagnosis not present

## 2022-12-31 DIAGNOSIS — D631 Anemia in chronic kidney disease: Secondary | ICD-10-CM | POA: Diagnosis not present

## 2022-12-31 DIAGNOSIS — N2581 Secondary hyperparathyroidism of renal origin: Secondary | ICD-10-CM | POA: Diagnosis not present

## 2023-01-01 DIAGNOSIS — M6281 Muscle weakness (generalized): Secondary | ICD-10-CM | POA: Diagnosis not present

## 2023-01-01 DIAGNOSIS — Z01818 Encounter for other preprocedural examination: Secondary | ICD-10-CM | POA: Diagnosis not present

## 2023-01-01 DIAGNOSIS — Z789 Other specified health status: Secondary | ICD-10-CM | POA: Diagnosis not present

## 2023-01-01 DIAGNOSIS — Z7409 Other reduced mobility: Secondary | ICD-10-CM | POA: Diagnosis not present

## 2023-01-02 DIAGNOSIS — D631 Anemia in chronic kidney disease: Secondary | ICD-10-CM | POA: Diagnosis not present

## 2023-01-02 DIAGNOSIS — N2581 Secondary hyperparathyroidism of renal origin: Secondary | ICD-10-CM | POA: Diagnosis not present

## 2023-01-02 DIAGNOSIS — N186 End stage renal disease: Secondary | ICD-10-CM | POA: Diagnosis not present

## 2023-01-02 DIAGNOSIS — D509 Iron deficiency anemia, unspecified: Secondary | ICD-10-CM | POA: Diagnosis not present

## 2023-01-05 DIAGNOSIS — N2581 Secondary hyperparathyroidism of renal origin: Secondary | ICD-10-CM | POA: Diagnosis not present

## 2023-01-05 DIAGNOSIS — D631 Anemia in chronic kidney disease: Secondary | ICD-10-CM | POA: Diagnosis not present

## 2023-01-05 DIAGNOSIS — N186 End stage renal disease: Secondary | ICD-10-CM | POA: Diagnosis not present

## 2023-01-05 DIAGNOSIS — D509 Iron deficiency anemia, unspecified: Secondary | ICD-10-CM | POA: Diagnosis not present

## 2023-01-07 DIAGNOSIS — D631 Anemia in chronic kidney disease: Secondary | ICD-10-CM | POA: Diagnosis not present

## 2023-01-07 DIAGNOSIS — N2581 Secondary hyperparathyroidism of renal origin: Secondary | ICD-10-CM | POA: Diagnosis not present

## 2023-01-07 DIAGNOSIS — N186 End stage renal disease: Secondary | ICD-10-CM | POA: Diagnosis not present

## 2023-01-07 DIAGNOSIS — D509 Iron deficiency anemia, unspecified: Secondary | ICD-10-CM | POA: Diagnosis not present

## 2023-01-09 DIAGNOSIS — N2581 Secondary hyperparathyroidism of renal origin: Secondary | ICD-10-CM | POA: Diagnosis not present

## 2023-01-09 DIAGNOSIS — N186 End stage renal disease: Secondary | ICD-10-CM | POA: Diagnosis not present

## 2023-01-09 DIAGNOSIS — D509 Iron deficiency anemia, unspecified: Secondary | ICD-10-CM | POA: Diagnosis not present

## 2023-01-09 DIAGNOSIS — D631 Anemia in chronic kidney disease: Secondary | ICD-10-CM | POA: Diagnosis not present

## 2023-01-12 DIAGNOSIS — N186 End stage renal disease: Secondary | ICD-10-CM | POA: Diagnosis not present

## 2023-01-12 DIAGNOSIS — D509 Iron deficiency anemia, unspecified: Secondary | ICD-10-CM | POA: Diagnosis not present

## 2023-01-12 DIAGNOSIS — D631 Anemia in chronic kidney disease: Secondary | ICD-10-CM | POA: Diagnosis not present

## 2023-01-12 DIAGNOSIS — N2581 Secondary hyperparathyroidism of renal origin: Secondary | ICD-10-CM | POA: Diagnosis not present

## 2023-01-14 DIAGNOSIS — N2581 Secondary hyperparathyroidism of renal origin: Secondary | ICD-10-CM | POA: Diagnosis not present

## 2023-01-14 DIAGNOSIS — N186 End stage renal disease: Secondary | ICD-10-CM | POA: Diagnosis not present

## 2023-01-14 DIAGNOSIS — D509 Iron deficiency anemia, unspecified: Secondary | ICD-10-CM | POA: Diagnosis not present

## 2023-01-14 DIAGNOSIS — D631 Anemia in chronic kidney disease: Secondary | ICD-10-CM | POA: Diagnosis not present

## 2023-01-16 DIAGNOSIS — D631 Anemia in chronic kidney disease: Secondary | ICD-10-CM | POA: Diagnosis not present

## 2023-01-16 DIAGNOSIS — N186 End stage renal disease: Secondary | ICD-10-CM | POA: Diagnosis not present

## 2023-01-16 DIAGNOSIS — D509 Iron deficiency anemia, unspecified: Secondary | ICD-10-CM | POA: Diagnosis not present

## 2023-01-16 DIAGNOSIS — N2581 Secondary hyperparathyroidism of renal origin: Secondary | ICD-10-CM | POA: Diagnosis not present

## 2023-01-19 DIAGNOSIS — N186 End stage renal disease: Secondary | ICD-10-CM | POA: Diagnosis not present

## 2023-01-19 DIAGNOSIS — D631 Anemia in chronic kidney disease: Secondary | ICD-10-CM | POA: Diagnosis not present

## 2023-01-19 DIAGNOSIS — D509 Iron deficiency anemia, unspecified: Secondary | ICD-10-CM | POA: Diagnosis not present

## 2023-01-19 DIAGNOSIS — N2581 Secondary hyperparathyroidism of renal origin: Secondary | ICD-10-CM | POA: Diagnosis not present

## 2023-01-21 DIAGNOSIS — N186 End stage renal disease: Secondary | ICD-10-CM | POA: Diagnosis not present

## 2023-01-21 DIAGNOSIS — D509 Iron deficiency anemia, unspecified: Secondary | ICD-10-CM | POA: Diagnosis not present

## 2023-01-21 DIAGNOSIS — N2581 Secondary hyperparathyroidism of renal origin: Secondary | ICD-10-CM | POA: Diagnosis not present

## 2023-01-21 DIAGNOSIS — D631 Anemia in chronic kidney disease: Secondary | ICD-10-CM | POA: Diagnosis not present

## 2023-01-22 DIAGNOSIS — M6281 Muscle weakness (generalized): Secondary | ICD-10-CM | POA: Diagnosis not present

## 2023-01-22 DIAGNOSIS — Z789 Other specified health status: Secondary | ICD-10-CM | POA: Diagnosis not present

## 2023-01-22 DIAGNOSIS — Z01818 Encounter for other preprocedural examination: Secondary | ICD-10-CM | POA: Diagnosis not present

## 2023-01-22 DIAGNOSIS — Z7409 Other reduced mobility: Secondary | ICD-10-CM | POA: Diagnosis not present

## 2023-01-23 DIAGNOSIS — D631 Anemia in chronic kidney disease: Secondary | ICD-10-CM | POA: Diagnosis not present

## 2023-01-23 DIAGNOSIS — N186 End stage renal disease: Secondary | ICD-10-CM | POA: Diagnosis not present

## 2023-01-23 DIAGNOSIS — D509 Iron deficiency anemia, unspecified: Secondary | ICD-10-CM | POA: Diagnosis not present

## 2023-01-23 DIAGNOSIS — N2581 Secondary hyperparathyroidism of renal origin: Secondary | ICD-10-CM | POA: Diagnosis not present

## 2023-01-26 DIAGNOSIS — N2581 Secondary hyperparathyroidism of renal origin: Secondary | ICD-10-CM | POA: Diagnosis not present

## 2023-01-26 DIAGNOSIS — I10 Essential (primary) hypertension: Secondary | ICD-10-CM | POA: Diagnosis not present

## 2023-01-26 DIAGNOSIS — D631 Anemia in chronic kidney disease: Secondary | ICD-10-CM | POA: Diagnosis not present

## 2023-01-26 DIAGNOSIS — D509 Iron deficiency anemia, unspecified: Secondary | ICD-10-CM | POA: Diagnosis not present

## 2023-01-26 DIAGNOSIS — N186 End stage renal disease: Secondary | ICD-10-CM | POA: Diagnosis not present

## 2023-01-27 ENCOUNTER — Ambulatory Visit: Payer: Self-pay

## 2023-01-27 NOTE — Patient Outreach (Signed)
  Care Coordination   Follow Up Visit Note   01/27/2023 Name: Jennifer Payne MRN: [id number] DOB: [birth year]  Jennifer Payne is a 73 y.o. year old female who sees Dorothyann Peng, MD for primary care. I spoke with  Jennifer Payne by phone today.  What matters to the patients health and wellness today?  Patient would like to follow an anti-inflammatory diet. She would like to continue to work with outpatient PT to help build stamina and endurance and work on her gait and posture.     Goals Addressed             This Visit's Progress    To start PT for knee strengthening       Care Coordination Interventions: Evaluation of current treatment plan related to impaired physical mobility  and patient's adherence to plan as established by provider Reviewed and discussed with patient that she is now working with a PT to help build strength and endurance and work on gait and posture, patient admits that she leans forward when she walks due to feeling this is a more comfortable way to walk Discussed and reviewed upcoming PCP visit scheduled for tomorrow, 01/28/23 to evaluate her lumbar spine and postural changes per recommendation of the PT Mailed printed educational resources to patient related to anti-inflammatory foods per patient request to help better manage her arthritis Educated patient about the importance to ask the registered dietician at her kidney center about food choices that may be contraindicated with her renal diet Educated patient while providing rationale on the importance of asking her pharmacist before starting any herbal supplements to help prevent adverse effects and or overdose and patient verbalizes understanding      Interventions Today    Flowsheet Row Most Recent Value  Chronic Disease   Chronic disease during today's visit Chronic Kidney Disease/End Stage Renal Disease (ESRD), Other  [PT for pre-transplant workup]  General Interventions   General  Interventions Discussed/Reviewed General Interventions Discussed, General Interventions Reviewed, Doctor Visits  Doctor Visits Discussed/Reviewed Doctor Visits Reviewed, Doctor Visits Discussed, Specialist, PCP  Exercise Interventions   Exercise Discussed/Reviewed Exercise Discussed, Exercise Reviewed, Physical Activity  Physical Activity Discussed/Reviewed Physical Activity Reviewed, Physical Activity Discussed, Home Exercise Program (HEP)  Education Interventions   Education Provided Provided Education  Provided Verbal Education On When to see the doctor, Nutrition, Exercise, Medication  Nutrition Interventions   Nutrition Discussed/Reviewed Nutrition Discussed, Nutrition Reviewed  [anti-inflammatory]  Pharmacy Interventions   Pharmacy Dicussed/Reviewed Pharmacy Topics Discussed, Pharmacy Topics Reviewed, Medications and their functions  [herbal supplements]  Safety Interventions   Safety Discussed/Reviewed Home Safety  Home Safety Assistive Devices          SDOH assessments and interventions completed:  No     Care Coordination Interventions:  Yes, provided   Follow up plan: Follow up call scheduled for 04/30/23 @10 :30 AM    Encounter Outcome:  Pt. Visit Completed

## 2023-01-27 NOTE — Patient Instructions (Signed)
Visit Information  Thank you for taking time to visit with me today. Please don't hesitate to contact me if I can be of assistance to you.   Following are the goals we discussed today:   Goals Addressed             This Visit's Progress    To start PT for knee strengthening       Care Coordination Interventions: Evaluation of current treatment plan related to impaired physical mobility  and patient's adherence to plan as established by provider Reviewed and discussed with patient that she is now working with a PT to help build strength and endurance and work on gait and posture, patient admits that she leans forward when she walks due to feeling this is a more comfortable way to walk Discussed and reviewed upcoming PCP visit scheduled for tomorrow, 01/28/23 to evaluate her lumbar spine and postural changes per recommendation of the PT Mailed printed educational resources to patient related to anti-inflammatory foods per patient request to help better manage her arthritis Educated patient about the importance to ask the registered dietician at her kidney center about food choices that may be contraindicated with her renal diet Educated patient while providing rationale on the importance of asking her pharmacist before starting any herbal supplements to help prevent adverse effects and or overdose and patient verbalizes understanding          Our next appointment is by telephone on 04/30/23 at 10:30 AM  Please call the care guide team at [phone] if you need to cancel or reschedule your appointment.   If you are experiencing a Mental Health or Behavioral Health Crisis or need someone to talk to, please call 1-800-273-TALK (toll free, 24 hour hotline)  Patient verbalizes understanding of instructions and care plan provided today and agrees to view in MyChart. Active MyChart status and patient understanding of how to access instructions and care plan via MyChart confirmed with patient.      Delsa Sale, RN, BSN, CCM Care Management Coordinator [facility] Management  Direct Phone: [phone]

## 2023-01-28 ENCOUNTER — Ambulatory Visit: Payer: Medicare PPO | Admitting: Family Medicine

## 2023-01-28 ENCOUNTER — Encounter: Payer: Self-pay | Admitting: Family Medicine

## 2023-01-28 VITALS — BP 102/60 | HR 93 | Temp 98.6°F | Ht 67.0 in | Wt 229.0 lb

## 2023-01-28 DIAGNOSIS — R531 Weakness: Secondary | ICD-10-CM

## 2023-01-28 DIAGNOSIS — Z992 Dependence on renal dialysis: Secondary | ICD-10-CM | POA: Diagnosis not present

## 2023-01-28 DIAGNOSIS — Z7409 Other reduced mobility: Secondary | ICD-10-CM

## 2023-01-28 DIAGNOSIS — I120 Hypertensive chronic kidney disease with stage 5 chronic kidney disease or end stage renal disease: Secondary | ICD-10-CM

## 2023-01-28 DIAGNOSIS — E1122 Type 2 diabetes mellitus with diabetic chronic kidney disease: Secondary | ICD-10-CM

## 2023-01-28 DIAGNOSIS — N186 End stage renal disease: Secondary | ICD-10-CM

## 2023-01-28 DIAGNOSIS — M25562 Pain in left knee: Secondary | ICD-10-CM

## 2023-01-28 DIAGNOSIS — D631 Anemia in chronic kidney disease: Secondary | ICD-10-CM | POA: Diagnosis not present

## 2023-01-28 DIAGNOSIS — D509 Iron deficiency anemia, unspecified: Secondary | ICD-10-CM | POA: Diagnosis not present

## 2023-01-28 DIAGNOSIS — E6609 Other obesity due to excess calories: Secondary | ICD-10-CM

## 2023-01-28 DIAGNOSIS — N2581 Secondary hyperparathyroidism of renal origin: Secondary | ICD-10-CM | POA: Diagnosis not present

## 2023-01-28 DIAGNOSIS — E7800 Pure hypercholesterolemia, unspecified: Secondary | ICD-10-CM | POA: Diagnosis not present

## 2023-01-28 DIAGNOSIS — I129 Hypertensive chronic kidney disease with stage 1 through stage 4 chronic kidney disease, or unspecified chronic kidney disease: Secondary | ICD-10-CM

## 2023-01-28 DIAGNOSIS — Z6835 Body mass index (BMI) 35.0-35.9, adult: Secondary | ICD-10-CM

## 2023-01-28 NOTE — Progress Notes (Signed)
I,[name], CMA,acting as a Neurosurgeon for [employer], NP.,have documented all relevant documentation on the behalf of Jennifer Canter, NP,as directed by  Jazion Atteberry Moshe Salisbury, NP while in the presence of Mersadies Petree, NP.  Subjective:  Patient ID: Jennifer Payne , female    DOB: [birth year] , 72 y.o.   MRN: [id number]  Chief Complaint  Patient presents with   Diabetes   Hypertension   Weakness    HPI  Patient presents today with knee pain and difficulty with lifting her legs. She reports the left leg generally is more difficult to lift up than right.Patient  is getting Physical Therapy at [facility] since June 6th,2024. Since will be done by July 19th, she is getting evaluated since she is on the transplant list. She gets dialysis M,W, F at [facility] since 2019 d/t ESRD r/t DM2  Diabetes She has type 2 diabetes mellitus. Pertinent negatives for diabetes include no polydipsia, no polyphagia and no polyuria.     Past Medical History:  Diagnosis Date   Cataract    Diabetes mellitus    Hyperlipidemia    Hypertension    Thyroid disease      Family History  Problem Relation Age of Onset   Alzheimer's disease Mother    Coronary artery disease Mother    Hyperlipidemia Mother    Obesity Mother    Hypertension Father    Colon cancer Neg Hx      Current Outpatient Medications:    atorvastatin (LIPITOR) 80 MG tablet, Take 1 tablet (80 mg total) by mouth daily., Disp: 90 tablet, Rfl: 2   calcium acetate (PHOSLO) 667 MG tablet, TAKE 1 TABLET BY MOUTH ONCE DAILY WITH MEALS, Disp: , Rfl:    levothyroxine (SYNTHROID) 88 MCG tablet, Take 1 tablet by mouth once daily, Disp: 90 tablet, Rfl: 0   sevelamer carbonate (RENVELA) 800 MG tablet, TAKE 3 TABLETS BY MOUTH WITH MEALS, Disp: , Rfl:    Allergies  Allergen Reactions   Erythromycin Rash     Review of Systems  Constitutional: Negative.   Eyes: Negative.   Cardiovascular:        Occasional leg swelling   Endocrine: Negative for polydipsia, polyphagia and polyuria.  Genitourinary:  Positive for decreased urine volume.  Musculoskeletal: Negative.   Skin: Negative.   Psychiatric/Behavioral: Negative.       Today's Vitals   01/28/23 1444  BP: 102/60  Pulse: 93  Temp: 98.6 F (37 C)  Weight: 229 lb (103.9 kg)  Height: 5\' 7"  (1.702 m)  PainSc: 0-No pain   Body mass index is 35.87 kg/m.  Wt Readings from Last 3 Encounters:  01/28/23 229 lb (103.9 kg)  07/24/22 220 lb (99.8 kg)  05/19/22 230 lb 3.2 oz (104.4 kg)     Objective:  Physical Exam Constitutional:      Appearance: Normal appearance.  Cardiovascular:     Rate and Rhythm: Normal rate and regular rhythm.     Pulses: Normal pulses.     Heart sounds: Normal heart sounds.  Pulmonary:     Effort: Pulmonary effort is normal.     Breath sounds: Normal breath sounds.  Abdominal:     General: Bowel sounds are normal.  Neurological:     General: No focal deficit present.     Mental Status: She is alert and oriented to person, place, and time.         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 M,W,F  Orders: -     Basic metabolic panel -     CBC -     Hemoglobin A1c  Weakness  Pure hypercholesterolemia Assessment & Plan: Continue current treat.  Orders: -     Lipid panel  Hypertensive nephropathy Assessment & Plan: Chronic. Stable   Impaired functional mobility and endurance Assessment & Plan: Currently on PT till 02/13/2023   Left anterior knee pain -     Ambulatory referral to Orthopedic Surgery  Class 2 obesity due to excess calories with body mass index (BMI) of 35.0 to 35.9 in adult, unspecified whether serious comorbidity present     Return for keep next appt as scheduled , physical.  Patient was given opportunity to ask questions. Patient verbalized understanding of the plan and was able to repeat key elements  of the plan. All questions were answered to their satisfaction.  Kearsten Ginther Moshe Salisbury, NP  I, Anavi Branscum Moshe Salisbury, NP, have reviewed all documentation for this visit. The documentation on 02/09/23 for the exam, diagnosis, procedures, and orders are all accurate and complete.   IF YOU HAVE BEEN REFERRED TO A SPECIALIST, IT MAY TAKE 1-2 WEEKS TO SCHEDULE/PROCESS THE REFERRAL. IF YOU HAVE NOT HEARD FROM US/SPECIALIST IN TWO WEEKS, PLEASE GIVE [place] A CALL AT [phone].   THE PATIENT IS ENCOURAGED TO PRACTICE SOCIAL DISTANCING DUE TO THE COVID-19 PANDEMIC.

## 2023-01-28 NOTE — Patient Instructions (Signed)

## 2023-01-29 LAB — BASIC METABOLIC PANEL
BUN/Creatinine Ratio: 4 — ABNORMAL LOW (ref 12–28)
BUN: 22 mg/dL (ref 8–27)
CO2: 22 mmol/L (ref 20–29)
Calcium: 9.3 mg/dL (ref 8.7–10.3)
Chloride: 98 mmol/L (ref 96–106)
Creatinine, Ser: 5.01 mg/dL — ABNORMAL HIGH (ref 0.57–1.00)
Glucose: 167 mg/dL — ABNORMAL HIGH (ref 70–99)
Potassium: 5 mmol/L (ref 3.5–5.2)
Sodium: 138 mmol/L (ref 134–144)
eGFR: 9 mL/min/{1.73_m2} — ABNORMAL LOW (ref >59)

## 2023-01-29 LAB — CBC
Hematocrit: 37.7 % (ref 34.0–46.6)
Hemoglobin: 12 g/dL (ref 11.1–15.9)
MCH: 29.2 pg (ref 26.6–33.0)
MCHC: 31.8 g/dL (ref 31.5–35.7)
MCV: 92 fL (ref 79–97)
Platelets: 198 10*3/uL (ref 150–450)
RBC: 4.11 x10E6/uL (ref 3.77–5.28)
RDW: 12.1 % (ref 11.7–15.4)
WBC: 6.1 10*3/uL (ref 3.4–10.8)

## 2023-01-29 LAB — LIPID PANEL
Chol/HDL Ratio: 2.2 ratio (ref 0.0–4.4)
Cholesterol, Total: 140 mg/dL (ref 100–199)
HDL: 63 mg/dL (ref >39)
LDL Chol Calc (NIH): 57 mg/dL (ref 0–99)
Triglycerides: 113 mg/dL (ref 0–149)
VLDL Cholesterol Cal: 20 mg/dL (ref 5–40)

## 2023-01-29 LAB — HEMOGLOBIN A1C
Est. average glucose Bld gHb Est-mCnc: 169 mg/dL
Hgb A1c MFr Bld: 7.5 % — ABNORMAL HIGH (ref 4.8–5.6)

## 2023-01-30 DIAGNOSIS — N186 End stage renal disease: Secondary | ICD-10-CM | POA: Diagnosis not present

## 2023-01-30 DIAGNOSIS — N2581 Secondary hyperparathyroidism of renal origin: Secondary | ICD-10-CM | POA: Diagnosis not present

## 2023-01-30 DIAGNOSIS — D509 Iron deficiency anemia, unspecified: Secondary | ICD-10-CM | POA: Diagnosis not present

## 2023-01-30 DIAGNOSIS — D631 Anemia in chronic kidney disease: Secondary | ICD-10-CM | POA: Diagnosis not present

## 2023-02-02 DIAGNOSIS — D509 Iron deficiency anemia, unspecified: Secondary | ICD-10-CM | POA: Diagnosis not present

## 2023-02-02 DIAGNOSIS — N186 End stage renal disease: Secondary | ICD-10-CM | POA: Diagnosis not present

## 2023-02-02 DIAGNOSIS — D631 Anemia in chronic kidney disease: Secondary | ICD-10-CM | POA: Diagnosis not present

## 2023-02-02 DIAGNOSIS — N2581 Secondary hyperparathyroidism of renal origin: Secondary | ICD-10-CM | POA: Diagnosis not present

## 2023-02-04 DIAGNOSIS — N2581 Secondary hyperparathyroidism of renal origin: Secondary | ICD-10-CM | POA: Diagnosis not present

## 2023-02-04 DIAGNOSIS — N186 End stage renal disease: Secondary | ICD-10-CM | POA: Diagnosis not present

## 2023-02-04 DIAGNOSIS — E119 Type 2 diabetes mellitus without complications: Secondary | ICD-10-CM | POA: Diagnosis not present

## 2023-02-04 DIAGNOSIS — D631 Anemia in chronic kidney disease: Secondary | ICD-10-CM | POA: Diagnosis not present

## 2023-02-04 DIAGNOSIS — D509 Iron deficiency anemia, unspecified: Secondary | ICD-10-CM | POA: Diagnosis not present

## 2023-02-06 DIAGNOSIS — D631 Anemia in chronic kidney disease: Secondary | ICD-10-CM | POA: Diagnosis not present

## 2023-02-06 DIAGNOSIS — D509 Iron deficiency anemia, unspecified: Secondary | ICD-10-CM | POA: Diagnosis not present

## 2023-02-06 DIAGNOSIS — N2581 Secondary hyperparathyroidism of renal origin: Secondary | ICD-10-CM | POA: Diagnosis not present

## 2023-02-06 DIAGNOSIS — N186 End stage renal disease: Secondary | ICD-10-CM | POA: Diagnosis not present

## 2023-02-09 DIAGNOSIS — Z7409 Other reduced mobility: Secondary | ICD-10-CM | POA: Insufficient documentation

## 2023-02-09 DIAGNOSIS — N186 End stage renal disease: Secondary | ICD-10-CM | POA: Diagnosis not present

## 2023-02-09 DIAGNOSIS — E7800 Pure hypercholesterolemia, unspecified: Secondary | ICD-10-CM | POA: Insufficient documentation

## 2023-02-09 DIAGNOSIS — M25562 Pain in left knee: Secondary | ICD-10-CM | POA: Insufficient documentation

## 2023-02-09 DIAGNOSIS — D631 Anemia in chronic kidney disease: Secondary | ICD-10-CM | POA: Diagnosis not present

## 2023-02-09 DIAGNOSIS — D509 Iron deficiency anemia, unspecified: Secondary | ICD-10-CM | POA: Diagnosis not present

## 2023-02-09 DIAGNOSIS — N2581 Secondary hyperparathyroidism of renal origin: Secondary | ICD-10-CM | POA: Diagnosis not present

## 2023-02-09 NOTE — Assessment & Plan Note (Signed)
 Chronic Stable 

## 2023-02-09 NOTE — Assessment & Plan Note (Signed)
Currently on PT till 02/13/2023

## 2023-02-09 NOTE — Assessment & Plan Note (Signed)
Continue current treat.

## 2023-02-09 NOTE — Assessment & Plan Note (Signed)
On dialysis since 2019. M,W,F

## 2023-02-11 DIAGNOSIS — N186 End stage renal disease: Secondary | ICD-10-CM | POA: Diagnosis not present

## 2023-02-11 DIAGNOSIS — N2581 Secondary hyperparathyroidism of renal origin: Secondary | ICD-10-CM | POA: Diagnosis not present

## 2023-02-11 DIAGNOSIS — D509 Iron deficiency anemia, unspecified: Secondary | ICD-10-CM | POA: Diagnosis not present

## 2023-02-11 DIAGNOSIS — D631 Anemia in chronic kidney disease: Secondary | ICD-10-CM | POA: Diagnosis not present

## 2023-02-13 DIAGNOSIS — N186 End stage renal disease: Secondary | ICD-10-CM | POA: Diagnosis not present

## 2023-02-13 DIAGNOSIS — D509 Iron deficiency anemia, unspecified: Secondary | ICD-10-CM | POA: Diagnosis not present

## 2023-02-13 DIAGNOSIS — N2581 Secondary hyperparathyroidism of renal origin: Secondary | ICD-10-CM | POA: Diagnosis not present

## 2023-02-13 DIAGNOSIS — D631 Anemia in chronic kidney disease: Secondary | ICD-10-CM | POA: Diagnosis not present

## 2023-02-16 DIAGNOSIS — D631 Anemia in chronic kidney disease: Secondary | ICD-10-CM | POA: Diagnosis not present

## 2023-02-16 DIAGNOSIS — N2581 Secondary hyperparathyroidism of renal origin: Secondary | ICD-10-CM | POA: Diagnosis not present

## 2023-02-16 DIAGNOSIS — D509 Iron deficiency anemia, unspecified: Secondary | ICD-10-CM | POA: Diagnosis not present

## 2023-02-16 DIAGNOSIS — N186 End stage renal disease: Secondary | ICD-10-CM | POA: Diagnosis not present

## 2023-02-18 DIAGNOSIS — N186 End stage renal disease: Secondary | ICD-10-CM | POA: Diagnosis not present

## 2023-02-18 DIAGNOSIS — D631 Anemia in chronic kidney disease: Secondary | ICD-10-CM | POA: Diagnosis not present

## 2023-02-18 DIAGNOSIS — N2581 Secondary hyperparathyroidism of renal origin: Secondary | ICD-10-CM | POA: Diagnosis not present

## 2023-02-18 DIAGNOSIS — D509 Iron deficiency anemia, unspecified: Secondary | ICD-10-CM | POA: Diagnosis not present

## 2023-02-19 ENCOUNTER — Ambulatory Visit: Payer: Medicare PPO | Admitting: Physician Assistant

## 2023-02-20 DIAGNOSIS — D631 Anemia in chronic kidney disease: Secondary | ICD-10-CM | POA: Diagnosis not present

## 2023-02-20 DIAGNOSIS — N2581 Secondary hyperparathyroidism of renal origin: Secondary | ICD-10-CM | POA: Diagnosis not present

## 2023-02-20 DIAGNOSIS — N186 End stage renal disease: Secondary | ICD-10-CM | POA: Diagnosis not present

## 2023-02-20 DIAGNOSIS — D509 Iron deficiency anemia, unspecified: Secondary | ICD-10-CM | POA: Diagnosis not present

## 2023-02-23 DIAGNOSIS — D509 Iron deficiency anemia, unspecified: Secondary | ICD-10-CM | POA: Diagnosis not present

## 2023-02-23 DIAGNOSIS — N2581 Secondary hyperparathyroidism of renal origin: Secondary | ICD-10-CM | POA: Diagnosis not present

## 2023-02-23 DIAGNOSIS — D631 Anemia in chronic kidney disease: Secondary | ICD-10-CM | POA: Diagnosis not present

## 2023-02-23 DIAGNOSIS — N186 End stage renal disease: Secondary | ICD-10-CM | POA: Diagnosis not present

## 2023-02-25 DIAGNOSIS — D509 Iron deficiency anemia, unspecified: Secondary | ICD-10-CM | POA: Diagnosis not present

## 2023-02-25 DIAGNOSIS — D631 Anemia in chronic kidney disease: Secondary | ICD-10-CM | POA: Diagnosis not present

## 2023-02-25 DIAGNOSIS — N186 End stage renal disease: Secondary | ICD-10-CM | POA: Diagnosis not present

## 2023-02-25 DIAGNOSIS — N2581 Secondary hyperparathyroidism of renal origin: Secondary | ICD-10-CM | POA: Diagnosis not present

## 2023-02-26 DIAGNOSIS — M6281 Muscle weakness (generalized): Secondary | ICD-10-CM | POA: Diagnosis not present

## 2023-02-26 DIAGNOSIS — N186 End stage renal disease: Secondary | ICD-10-CM | POA: Diagnosis not present

## 2023-02-26 DIAGNOSIS — Z789 Other specified health status: Secondary | ICD-10-CM | POA: Diagnosis not present

## 2023-02-26 DIAGNOSIS — I10 Essential (primary) hypertension: Secondary | ICD-10-CM | POA: Diagnosis not present

## 2023-02-26 DIAGNOSIS — D631 Anemia in chronic kidney disease: Secondary | ICD-10-CM | POA: Diagnosis not present

## 2023-02-26 DIAGNOSIS — Z7409 Other reduced mobility: Secondary | ICD-10-CM | POA: Diagnosis not present

## 2023-02-26 DIAGNOSIS — R531 Weakness: Secondary | ICD-10-CM | POA: Diagnosis not present

## 2023-02-26 DIAGNOSIS — Z01818 Encounter for other preprocedural examination: Secondary | ICD-10-CM | POA: Diagnosis not present

## 2023-02-27 DIAGNOSIS — N186 End stage renal disease: Secondary | ICD-10-CM | POA: Diagnosis not present

## 2023-02-27 DIAGNOSIS — D631 Anemia in chronic kidney disease: Secondary | ICD-10-CM | POA: Diagnosis not present

## 2023-02-27 DIAGNOSIS — N2581 Secondary hyperparathyroidism of renal origin: Secondary | ICD-10-CM | POA: Diagnosis not present

## 2023-02-27 DIAGNOSIS — D509 Iron deficiency anemia, unspecified: Secondary | ICD-10-CM | POA: Diagnosis not present

## 2023-03-02 DIAGNOSIS — D631 Anemia in chronic kidney disease: Secondary | ICD-10-CM | POA: Diagnosis not present

## 2023-03-02 DIAGNOSIS — D509 Iron deficiency anemia, unspecified: Secondary | ICD-10-CM | POA: Diagnosis not present

## 2023-03-02 DIAGNOSIS — N2581 Secondary hyperparathyroidism of renal origin: Secondary | ICD-10-CM | POA: Diagnosis not present

## 2023-03-02 DIAGNOSIS — N186 End stage renal disease: Secondary | ICD-10-CM | POA: Diagnosis not present

## 2023-03-03 DIAGNOSIS — Z7409 Other reduced mobility: Secondary | ICD-10-CM | POA: Diagnosis not present

## 2023-03-03 DIAGNOSIS — M6281 Muscle weakness (generalized): Secondary | ICD-10-CM | POA: Diagnosis not present

## 2023-03-03 DIAGNOSIS — Z789 Other specified health status: Secondary | ICD-10-CM | POA: Diagnosis not present

## 2023-03-03 DIAGNOSIS — R531 Weakness: Secondary | ICD-10-CM | POA: Diagnosis not present

## 2023-03-03 DIAGNOSIS — Z01818 Encounter for other preprocedural examination: Secondary | ICD-10-CM | POA: Diagnosis not present

## 2023-03-04 DIAGNOSIS — D509 Iron deficiency anemia, unspecified: Secondary | ICD-10-CM | POA: Diagnosis not present

## 2023-03-04 DIAGNOSIS — N186 End stage renal disease: Secondary | ICD-10-CM | POA: Diagnosis not present

## 2023-03-04 DIAGNOSIS — E119 Type 2 diabetes mellitus without complications: Secondary | ICD-10-CM | POA: Diagnosis not present

## 2023-03-04 DIAGNOSIS — D631 Anemia in chronic kidney disease: Secondary | ICD-10-CM | POA: Diagnosis not present

## 2023-03-04 DIAGNOSIS — N2581 Secondary hyperparathyroidism of renal origin: Secondary | ICD-10-CM | POA: Diagnosis not present

## 2023-03-06 DIAGNOSIS — D509 Iron deficiency anemia, unspecified: Secondary | ICD-10-CM | POA: Diagnosis not present

## 2023-03-06 DIAGNOSIS — D631 Anemia in chronic kidney disease: Secondary | ICD-10-CM | POA: Diagnosis not present

## 2023-03-06 DIAGNOSIS — N2581 Secondary hyperparathyroidism of renal origin: Secondary | ICD-10-CM | POA: Diagnosis not present

## 2023-03-06 DIAGNOSIS — N186 End stage renal disease: Secondary | ICD-10-CM | POA: Diagnosis not present

## 2023-03-09 DIAGNOSIS — N186 End stage renal disease: Secondary | ICD-10-CM | POA: Diagnosis not present

## 2023-03-09 DIAGNOSIS — D631 Anemia in chronic kidney disease: Secondary | ICD-10-CM | POA: Diagnosis not present

## 2023-03-09 DIAGNOSIS — D509 Iron deficiency anemia, unspecified: Secondary | ICD-10-CM | POA: Diagnosis not present

## 2023-03-09 DIAGNOSIS — N2581 Secondary hyperparathyroidism of renal origin: Secondary | ICD-10-CM | POA: Diagnosis not present

## 2023-03-10 DIAGNOSIS — H31091 Other chorioretinal scars, right eye: Secondary | ICD-10-CM | POA: Diagnosis not present

## 2023-03-10 DIAGNOSIS — E113513 Type 2 diabetes mellitus with proliferative diabetic retinopathy with macular edema, bilateral: Secondary | ICD-10-CM | POA: Diagnosis not present

## 2023-03-10 DIAGNOSIS — E113511 Type 2 diabetes mellitus with proliferative diabetic retinopathy with macular edema, right eye: Secondary | ICD-10-CM | POA: Diagnosis not present

## 2023-03-11 DIAGNOSIS — N186 End stage renal disease: Secondary | ICD-10-CM | POA: Diagnosis not present

## 2023-03-11 DIAGNOSIS — D509 Iron deficiency anemia, unspecified: Secondary | ICD-10-CM | POA: Diagnosis not present

## 2023-03-11 DIAGNOSIS — D631 Anemia in chronic kidney disease: Secondary | ICD-10-CM | POA: Diagnosis not present

## 2023-03-11 DIAGNOSIS — N2581 Secondary hyperparathyroidism of renal origin: Secondary | ICD-10-CM | POA: Diagnosis not present

## 2023-03-13 DIAGNOSIS — D631 Anemia in chronic kidney disease: Secondary | ICD-10-CM | POA: Diagnosis not present

## 2023-03-13 DIAGNOSIS — N186 End stage renal disease: Secondary | ICD-10-CM | POA: Diagnosis not present

## 2023-03-13 DIAGNOSIS — N2581 Secondary hyperparathyroidism of renal origin: Secondary | ICD-10-CM | POA: Diagnosis not present

## 2023-03-13 DIAGNOSIS — D509 Iron deficiency anemia, unspecified: Secondary | ICD-10-CM | POA: Diagnosis not present

## 2023-03-16 DIAGNOSIS — N186 End stage renal disease: Secondary | ICD-10-CM | POA: Diagnosis not present

## 2023-03-16 DIAGNOSIS — D509 Iron deficiency anemia, unspecified: Secondary | ICD-10-CM | POA: Diagnosis not present

## 2023-03-16 DIAGNOSIS — N2581 Secondary hyperparathyroidism of renal origin: Secondary | ICD-10-CM | POA: Diagnosis not present

## 2023-03-16 DIAGNOSIS — D631 Anemia in chronic kidney disease: Secondary | ICD-10-CM | POA: Diagnosis not present

## 2023-03-18 DIAGNOSIS — D509 Iron deficiency anemia, unspecified: Secondary | ICD-10-CM | POA: Diagnosis not present

## 2023-03-18 DIAGNOSIS — D631 Anemia in chronic kidney disease: Secondary | ICD-10-CM | POA: Diagnosis not present

## 2023-03-18 DIAGNOSIS — N186 End stage renal disease: Secondary | ICD-10-CM | POA: Diagnosis not present

## 2023-03-18 DIAGNOSIS — N2581 Secondary hyperparathyroidism of renal origin: Secondary | ICD-10-CM | POA: Diagnosis not present

## 2023-03-20 DIAGNOSIS — D631 Anemia in chronic kidney disease: Secondary | ICD-10-CM | POA: Diagnosis not present

## 2023-03-20 DIAGNOSIS — N186 End stage renal disease: Secondary | ICD-10-CM | POA: Diagnosis not present

## 2023-03-20 DIAGNOSIS — N2581 Secondary hyperparathyroidism of renal origin: Secondary | ICD-10-CM | POA: Diagnosis not present

## 2023-03-20 DIAGNOSIS — D509 Iron deficiency anemia, unspecified: Secondary | ICD-10-CM | POA: Diagnosis not present

## 2023-03-24 DIAGNOSIS — D509 Iron deficiency anemia, unspecified: Secondary | ICD-10-CM | POA: Diagnosis not present

## 2023-03-24 DIAGNOSIS — D631 Anemia in chronic kidney disease: Secondary | ICD-10-CM | POA: Diagnosis not present

## 2023-03-24 DIAGNOSIS — N186 End stage renal disease: Secondary | ICD-10-CM | POA: Diagnosis not present

## 2023-03-24 DIAGNOSIS — N2581 Secondary hyperparathyroidism of renal origin: Secondary | ICD-10-CM | POA: Diagnosis not present

## 2023-03-25 DIAGNOSIS — D509 Iron deficiency anemia, unspecified: Secondary | ICD-10-CM | POA: Diagnosis not present

## 2023-03-25 DIAGNOSIS — D631 Anemia in chronic kidney disease: Secondary | ICD-10-CM | POA: Diagnosis not present

## 2023-03-25 DIAGNOSIS — N2581 Secondary hyperparathyroidism of renal origin: Secondary | ICD-10-CM | POA: Diagnosis not present

## 2023-03-25 DIAGNOSIS — N186 End stage renal disease: Secondary | ICD-10-CM | POA: Diagnosis not present

## 2023-03-27 DIAGNOSIS — N2581 Secondary hyperparathyroidism of renal origin: Secondary | ICD-10-CM | POA: Diagnosis not present

## 2023-03-27 DIAGNOSIS — D631 Anemia in chronic kidney disease: Secondary | ICD-10-CM | POA: Diagnosis not present

## 2023-03-27 DIAGNOSIS — N186 End stage renal disease: Secondary | ICD-10-CM | POA: Diagnosis not present

## 2023-03-27 DIAGNOSIS — D509 Iron deficiency anemia, unspecified: Secondary | ICD-10-CM | POA: Diagnosis not present

## 2023-03-29 DIAGNOSIS — D631 Anemia in chronic kidney disease: Secondary | ICD-10-CM | POA: Diagnosis not present

## 2023-03-29 DIAGNOSIS — I10 Essential (primary) hypertension: Secondary | ICD-10-CM | POA: Diagnosis not present

## 2023-03-29 DIAGNOSIS — N186 End stage renal disease: Secondary | ICD-10-CM | POA: Diagnosis not present

## 2023-03-30 DIAGNOSIS — N186 End stage renal disease: Secondary | ICD-10-CM | POA: Diagnosis not present

## 2023-03-30 DIAGNOSIS — D509 Iron deficiency anemia, unspecified: Secondary | ICD-10-CM | POA: Diagnosis not present

## 2023-03-30 DIAGNOSIS — Z23 Encounter for immunization: Secondary | ICD-10-CM | POA: Diagnosis not present

## 2023-03-30 DIAGNOSIS — N2581 Secondary hyperparathyroidism of renal origin: Secondary | ICD-10-CM | POA: Diagnosis not present

## 2023-03-30 DIAGNOSIS — D631 Anemia in chronic kidney disease: Secondary | ICD-10-CM | POA: Diagnosis not present

## 2023-04-01 DIAGNOSIS — N186 End stage renal disease: Secondary | ICD-10-CM | POA: Diagnosis not present

## 2023-04-01 DIAGNOSIS — Z23 Encounter for immunization: Secondary | ICD-10-CM | POA: Diagnosis not present

## 2023-04-01 DIAGNOSIS — E119 Type 2 diabetes mellitus without complications: Secondary | ICD-10-CM | POA: Diagnosis not present

## 2023-04-01 DIAGNOSIS — D509 Iron deficiency anemia, unspecified: Secondary | ICD-10-CM | POA: Diagnosis not present

## 2023-04-01 DIAGNOSIS — D631 Anemia in chronic kidney disease: Secondary | ICD-10-CM | POA: Diagnosis not present

## 2023-04-01 DIAGNOSIS — N2581 Secondary hyperparathyroidism of renal origin: Secondary | ICD-10-CM | POA: Diagnosis not present

## 2023-04-03 DIAGNOSIS — D509 Iron deficiency anemia, unspecified: Secondary | ICD-10-CM | POA: Diagnosis not present

## 2023-04-03 DIAGNOSIS — N2581 Secondary hyperparathyroidism of renal origin: Secondary | ICD-10-CM | POA: Diagnosis not present

## 2023-04-03 DIAGNOSIS — Z23 Encounter for immunization: Secondary | ICD-10-CM | POA: Diagnosis not present

## 2023-04-03 DIAGNOSIS — D631 Anemia in chronic kidney disease: Secondary | ICD-10-CM | POA: Diagnosis not present

## 2023-04-03 DIAGNOSIS — N186 End stage renal disease: Secondary | ICD-10-CM | POA: Diagnosis not present

## 2023-04-06 DIAGNOSIS — D509 Iron deficiency anemia, unspecified: Secondary | ICD-10-CM | POA: Diagnosis not present

## 2023-04-06 DIAGNOSIS — D631 Anemia in chronic kidney disease: Secondary | ICD-10-CM | POA: Diagnosis not present

## 2023-04-06 DIAGNOSIS — N2581 Secondary hyperparathyroidism of renal origin: Secondary | ICD-10-CM | POA: Diagnosis not present

## 2023-04-06 DIAGNOSIS — N186 End stage renal disease: Secondary | ICD-10-CM | POA: Diagnosis not present

## 2023-04-06 DIAGNOSIS — Z23 Encounter for immunization: Secondary | ICD-10-CM | POA: Diagnosis not present

## 2023-04-08 DIAGNOSIS — D509 Iron deficiency anemia, unspecified: Secondary | ICD-10-CM | POA: Diagnosis not present

## 2023-04-08 DIAGNOSIS — D631 Anemia in chronic kidney disease: Secondary | ICD-10-CM | POA: Diagnosis not present

## 2023-04-08 DIAGNOSIS — N2581 Secondary hyperparathyroidism of renal origin: Secondary | ICD-10-CM | POA: Diagnosis not present

## 2023-04-08 DIAGNOSIS — Z23 Encounter for immunization: Secondary | ICD-10-CM | POA: Diagnosis not present

## 2023-04-08 DIAGNOSIS — N186 End stage renal disease: Secondary | ICD-10-CM | POA: Diagnosis not present

## 2023-04-10 DIAGNOSIS — D631 Anemia in chronic kidney disease: Secondary | ICD-10-CM | POA: Diagnosis not present

## 2023-04-10 DIAGNOSIS — Z23 Encounter for immunization: Secondary | ICD-10-CM | POA: Diagnosis not present

## 2023-04-10 DIAGNOSIS — N186 End stage renal disease: Secondary | ICD-10-CM | POA: Diagnosis not present

## 2023-04-10 DIAGNOSIS — D509 Iron deficiency anemia, unspecified: Secondary | ICD-10-CM | POA: Diagnosis not present

## 2023-04-10 DIAGNOSIS — N2581 Secondary hyperparathyroidism of renal origin: Secondary | ICD-10-CM | POA: Diagnosis not present

## 2023-04-13 DIAGNOSIS — Z23 Encounter for immunization: Secondary | ICD-10-CM | POA: Diagnosis not present

## 2023-04-13 DIAGNOSIS — D631 Anemia in chronic kidney disease: Secondary | ICD-10-CM | POA: Diagnosis not present

## 2023-04-13 DIAGNOSIS — N2581 Secondary hyperparathyroidism of renal origin: Secondary | ICD-10-CM | POA: Diagnosis not present

## 2023-04-13 DIAGNOSIS — N186 End stage renal disease: Secondary | ICD-10-CM | POA: Diagnosis not present

## 2023-04-13 DIAGNOSIS — D509 Iron deficiency anemia, unspecified: Secondary | ICD-10-CM | POA: Diagnosis not present

## 2023-04-15 DIAGNOSIS — Z23 Encounter for immunization: Secondary | ICD-10-CM | POA: Diagnosis not present

## 2023-04-15 DIAGNOSIS — D631 Anemia in chronic kidney disease: Secondary | ICD-10-CM | POA: Diagnosis not present

## 2023-04-15 DIAGNOSIS — D509 Iron deficiency anemia, unspecified: Secondary | ICD-10-CM | POA: Diagnosis not present

## 2023-04-15 DIAGNOSIS — N186 End stage renal disease: Secondary | ICD-10-CM | POA: Diagnosis not present

## 2023-04-15 DIAGNOSIS — N2581 Secondary hyperparathyroidism of renal origin: Secondary | ICD-10-CM | POA: Diagnosis not present

## 2023-04-16 ENCOUNTER — Ambulatory Visit: Payer: Medicare PPO

## 2023-04-16 DIAGNOSIS — Z0000 Encounter for general adult medical examination without abnormal findings: Secondary | ICD-10-CM | POA: Diagnosis not present

## 2023-04-16 NOTE — Patient Instructions (Signed)
Ms. Voorhees , Thank you for taking time to come for your Medicare Wellness Visit. I appreciate your ongoing commitment to your health goals. Please review the following plan we discussed and let me know if I can assist you in the future.   Referrals/Orders/Follow-Ups/Clinician Recommendations: none  This is a list of the screening recommended for you and due dates:  Health Maintenance  Topic Date Due   Flu Shot  02/26/2023   COVID-19 Vaccine (4 - 2023-24 season) 03/29/2023   Complete foot exam   04/18/2023   DTaP/Tdap/Td vaccine (2 - Tdap) 06/14/2023   Hemoglobin A1C  07/31/2023   Eye exam for diabetics  09/10/2023   Medicare Annual Wellness Visit  04/15/2024   Colon Cancer Screening  05/24/2024   Mammogram  06/10/2024   Pneumonia Vaccine  Completed   DEXA scan (bone density measurement)  Completed   Hepatitis C Screening  Completed   Zoster (Shingles) Vaccine  Completed   HPV Vaccine  Aged Out    Advanced directives: (Copy Requested) Please bring a copy of your health care power of attorney and living will to the office to be added to your chart at your convenience.  Next Medicare Annual Wellness Visit scheduled for next year: No, office will schedule appointment  Insert Preventive Care attachment Insert FALL PREVENTION attachment if needed

## 2023-04-16 NOTE — Progress Notes (Signed)
Subjective:   Jennifer Payne is a 72 y.o. female who presents for Medicare Annual (Subsequent) preventive examination.  Visit Complete: Virtual  I connected with  Sabino Snipes Troop on 04/16/23 by a audio enabled telemedicine application and verified that I am speaking with the correct person using two identifiers.  Patient Location: Home  Provider Location: Office/Clinic  I discussed the limitations of evaluation and management by telemedicine. The patient expressed understanding and agreed to proceed.  Vital Signs: Unable to obtain new vitals due to this being a telehealth visit.  Cardiac Risk Factors include: advanced age (>59men, >14 women);diabetes mellitus;hypertension     Objective:    Today's Vitals   There is no height or weight on file to calculate BMI.     04/16/2023   11:17 AM 07/24/2022   11:04 AM 06/19/2021    8:36 AM 05/03/2020    2:17 PM 02/23/2019    9:58 AM 05/10/2014    1:24 PM  Advanced Directives  Does Patient Have a Medical Advance Directive? Yes Yes Yes Yes No No  Type of Estate agent of [employer] Power of [place];Living will Healthcare Power of [place];Living will Healthcare Power of Attorney    Copy of Healthcare Power of Attorney in Chart? No - copy requested No - copy requested No - copy requested No - copy requested    Would patient like information on creating a medical advance directive?     Yes (MAU/Ambulatory/Procedural Areas - Information given)     Current Medications (verified) Outpatient Encounter Medications as of 04/16/2023  Medication Sig   atorvastatin (LIPITOR) 80 MG tablet Take 1 tablet (80 mg total) by mouth daily.   calcium acetate (PHOSLO) 667 MG tablet TAKE 1 TABLET BY MOUTH ONCE DAILY WITH MEALS   levothyroxine (SYNTHROID) 88 MCG tablet Take 1 tablet by mouth once daily   sevelamer carbonate (RENVELA) 800 MG tablet TAKE 3 TABLETS BY MOUTH WITH MEALS   No facility-administered encounter  medications on file as of 04/16/2023.    Allergies (verified) Erythromycin   History: Past Medical History:  Diagnosis Date   Cataract    Diabetes mellitus    Hyperlipidemia    Hypertension    Thyroid disease    Past Surgical History:  Procedure Laterality Date   AV FISTULA PLACEMENT Left 10/2019   CATARACT EXTRACTION     COLONOSCOPY  2009   TONSILLECTOMY  1973   Family History  Problem Relation Age of Onset   Alzheimer's disease Mother    Coronary artery disease Mother    Hyperlipidemia Mother    Obesity Mother    Hypertension Father    Colon cancer Neg Hx    Social History   Socioeconomic History   Marital status: Divorced    Spouse name: Not on file   Number of children: Not on file   Years of education: Not on file   Highest education level: Not on file  Occupational History   Occupation: retired  Tobacco Use   Smoking status: Never   Smokeless tobacco: Never  Vaping Use   Vaping status: Never Used  Substance and Sexual Activity   Alcohol use: No   Drug use: No   Sexual activity: Not Currently  Other Topics Concern   Not on file  Social History Narrative   Not on file   Social Determinants of Health   Financial Resource Strain: Low Risk  (04/16/2023)   Overall Financial Resource Strain (CARDIA)    Difficulty  of Paying Living Expenses: Not hard at all  Food Insecurity: No Food Insecurity (04/16/2023)   Hunger Vital Sign    Worried About Running Out of Food in the Last Year: Never true    Ran Out of Food in the Last Year: Never true  Transportation Needs: No Transportation Needs (04/16/2023)   PRAPARE - Administrator, Civil Service (Medical): No    Lack of Transportation (Non-Medical): No  Physical Activity: Inactive (04/16/2023)   Exercise Vital Sign    Days of Exercise per Week: 0 days    Minutes of Exercise per Session: 0 min  Stress: No Stress Concern Present (04/16/2023)   [employer] of Occupational Health - Occupational  Stress Questionnaire    Feeling of Stress : Not at all  Social Connections: Moderately Integrated (04/16/2023)   Social Connection and Isolation Panel [NHANES]    Frequency of Communication with Friends and Family: More than three times a week    Frequency of Social Gatherings with Friends and Family: Three times a week    Attends Religious Services: More than 4 times per year    Active Member of Clubs or Organizations: Yes    Attends Engineer, structural: More than 4 times per year    Marital Status: Divorced    Tobacco Counseling Counseling given: Not Answered   Clinical Intake:  Pre-visit preparation completed: Yes  Pain : No/denies pain     Nutritional Risks: None Diabetes: Yes CBG done?: No Did pt. bring in CBG monitor from home?: No  How often do you need to have someone help you when you read instructions, pamphlets, or other written materials from your doctor or pharmacy?: 1 - Never  Interpreter Needed?: No  Information entered by :: NAllen LPN   Activities of Daily Living    04/16/2023   11:11 AM 07/24/2022   11:06 AM  In your present state of health, do you have any difficulty performing the following activities:  Hearing? 0 0  Vision? 0 0  Difficulty concentrating or making decisions? 0 0  Walking or climbing stairs? 1 0  Comment due to knees   Dressing or bathing? 0 0  Doing errands, shopping? 0 0  Preparing Food and eating ? N N  Using the Toilet? N N  In the past six months, have you accidently leaked urine? N N  Do you have problems with loss of bowel control? N N  Managing your Medications? N N  Managing your Finances? N N  Housekeeping or managing your Housekeeping? N N    Patient Care Team: Dorothyann Peng, MD as PCP - General (Internal Medicine) Stephannie Li, MD as Consulting Physician (Ophthalmology)  Indicate any recent Medical Services you may have received from other than [facility] providers in the past year (date may be  approximate).     Assessment:   This is a routine wellness examination for Jennifer Payne.  Hearing/Vision screen Hearing Screening - Comments:: Denies hearing issues Vision Screening - Comments:: Regular eye exams, Dr. Allyne Gee   Goals Addressed             This Visit's Progress    Patient Stated       04/16/2023, wants to lose weight       Depression Screen    04/16/2023   11:20 AM 01/28/2023    2:47 PM 07/24/2022   11:06 AM 06/19/2021    8:38 AM 05/03/2020    2:18 PM 02/23/2019   10:00 AM 12/30/2018  2:03 PM  PHQ 2/9 Scores  PHQ - 2 Score 0 0 0 0 0 0 0  PHQ- 9 Score 1 0    1     Fall Risk    04/16/2023   11:19 AM 01/28/2023    2:47 PM 07/24/2022   11:05 AM 06/19/2021    8:37 AM 05/03/2020    2:17 PM  Fall Risk   Falls in the past year? 0 0 1 0 1  Comment   slipped off bed  tripped on uneven pavement  Number falls in past yr: 0 0 0  0  Injury with Fall? 0 0 0  0  Risk for fall due to : Medication side effect No Fall Risks Medication side effect No Fall Risks History of fall(s);Medication side effect  Follow up Falls prevention discussed;Falls evaluation completed Falls evaluation completed Falls evaluation completed;Education provided;Falls prevention discussed Falls evaluation completed;Education provided;Falls prevention discussed Falls evaluation completed;Education provided;Falls prevention discussed    MEDICARE RISK AT HOME: Medicare Risk at Home Any stairs in or around the home?: Yes If so, are there any without handrails?: No Home free of loose throw rugs in walkways, pet beds, electrical cords, etc?: Yes Adequate lighting in your home to reduce risk of falls?: Yes Life alert?: No Use of a cane, walker or w/c?: No Grab bars in the bathroom?: No Shower chair or bench in shower?: No Elevated toilet seat or a handicapped toilet?: No  TIMED UP AND GO:  Was the test performed?  No    Cognitive Function:        04/16/2023   11:21 AM 07/24/2022   11:07 AM  06/19/2021    8:39 AM 05/03/2020    2:19 PM 02/23/2019   10:03 AM  6CIT Screen  What Year? 0 points 0 points 0 points 0 points 0 points  What month? 0 points 0 points 0 points 0 points 0 points  What time? 0 points 0 points 0 points 0 points 0 points  Count back from 20 0 points 0 points 0 points 0 points 0 points  Months in reverse 0 points 0 points 0 points 0 points 0 points  Repeat phrase 2 points 0 points 6 points 0 points 0 points  Total Score 2 points 0 points 6 points 0 points 0 points    Immunizations Immunization History  Administered Date(s) Administered   DTaP 06/13/2013   Fluad Quad(high Dose 65+) 04/17/2022   Hepatitis A, Adult 08/08/2021   Influenza, High Dose Seasonal PF 05/19/2018, 05/08/2021   Janssen (J&J) SARS-COV-2 Vaccination 10/15/2019   Moderna Sars-Covid-2 Vaccination 10/29/2020, 07/18/2021   PNEUMOCOCCAL CONJUGATE-20 08/08/2021   Zoster Recombinant(Shingrix) 09/28/2021, 11/29/2021    TDAP status: Up to date  Flu Vaccine status: Up to date  Pneumococcal vaccine status: Up to date  Covid-19 vaccine status: Information provided on how to obtain vaccines.   Qualifies for Shingles Vaccine? Yes   Zostavax completed Yes   Shingrix Completed?: Yes  Screening Tests Health Maintenance  Topic Date Due   INFLUENZA VACCINE  02/26/2023   COVID-19 Vaccine (4 - 2023-24 season) 03/29/2023   FOOT EXAM  04/18/2023   DTaP/Tdap/Td (2 - Tdap) 06/14/2023   HEMOGLOBIN A1C  07/31/2023   OPHTHALMOLOGY EXAM  09/10/2023   Medicare Annual Wellness (AWV)  04/15/2024   Colonoscopy  05/24/2024   MAMMOGRAM  06/10/2024   Pneumonia Vaccine 68+ Years old  Completed   DEXA SCAN  Completed   Hepatitis C Screening  Completed  Zoster Vaccines- Shingrix  Completed   HPV VACCINES  Aged Out    Health Maintenance  Health Maintenance Due  Topic Date Due   INFLUENZA VACCINE  02/26/2023   COVID-19 Vaccine (4 - 2023-24 season) 03/29/2023    Colorectal cancer screening: Type  of screening: Colonoscopy. Completed 05/24/2014. Repeat every 10 years  Mammogram status: Completed 06/10/2022. Repeat every year  Bone Density status: Completed 05/24/2018.   Lung Cancer Screening: (Low Dose CT Chest recommended if Age 35-80 years, 20 pack-year currently smoking OR have quit w/in 15years.) does not qualify.   Lung Cancer Screening Referral: no  Additional Screening:  Hepatitis C Screening: does qualify; Completed 11/06/2016  Vision Screening: Recommended annual ophthalmology exams for early detection of glaucoma and other disorders of the eye. Is the patient up to date with their annual eye exam?  Yes  Who is the provider or what is the name of the office in which the patient attends annual eye exams? Dr. Allyne Gee If pt is not established with a provider, would they like to be referred to a provider to establish care? No .   Dental Screening: Recommended annual dental exams for proper oral hygiene  Diabetic Foot Exam: Diabetic Foot Exam: Completed 04/17/2022  Community Resource Referral / Chronic Care Management: CRR required this visit?  No   CCM required this visit?  No     Plan:     I have personally reviewed and noted the following in the patient's chart:   Medical and social history Use of alcohol, tobacco or illicit drugs  Current medications and supplements including opioid prescriptions. Patient is not currently taking opioid prescriptions. Functional ability and status Nutritional status Physical activity Advanced directives List of other physicians Hospitalizations, surgeries, and ER visits in previous 12 months Vitals Screenings to include cognitive, depression, and falls Referrals and appointments  In addition, I have reviewed and discussed with patient certain preventive protocols, quality metrics, and best practice recommendations. A written personalized care plan for preventive services as well as general preventive health recommendations  were provided to patient.     Barb Merino, LPN   1/61/0960   After Visit Summary: (MyChart) Due to this being a telephonic visit, the after visit summary with patients personalized plan was offered to patient via MyChart   Nurse Notes: none

## 2023-04-17 DIAGNOSIS — Z23 Encounter for immunization: Secondary | ICD-10-CM | POA: Diagnosis not present

## 2023-04-17 DIAGNOSIS — N186 End stage renal disease: Secondary | ICD-10-CM | POA: Diagnosis not present

## 2023-04-17 DIAGNOSIS — N2581 Secondary hyperparathyroidism of renal origin: Secondary | ICD-10-CM | POA: Diagnosis not present

## 2023-04-17 DIAGNOSIS — D631 Anemia in chronic kidney disease: Secondary | ICD-10-CM | POA: Diagnosis not present

## 2023-04-17 DIAGNOSIS — D509 Iron deficiency anemia, unspecified: Secondary | ICD-10-CM | POA: Diagnosis not present

## 2023-04-20 DIAGNOSIS — D509 Iron deficiency anemia, unspecified: Secondary | ICD-10-CM | POA: Diagnosis not present

## 2023-04-20 DIAGNOSIS — N2581 Secondary hyperparathyroidism of renal origin: Secondary | ICD-10-CM | POA: Diagnosis not present

## 2023-04-20 DIAGNOSIS — Z23 Encounter for immunization: Secondary | ICD-10-CM | POA: Diagnosis not present

## 2023-04-20 DIAGNOSIS — D631 Anemia in chronic kidney disease: Secondary | ICD-10-CM | POA: Diagnosis not present

## 2023-04-20 DIAGNOSIS — N186 End stage renal disease: Secondary | ICD-10-CM | POA: Diagnosis not present

## 2023-04-21 DIAGNOSIS — E113511 Type 2 diabetes mellitus with proliferative diabetic retinopathy with macular edema, right eye: Secondary | ICD-10-CM | POA: Diagnosis not present

## 2023-04-22 DIAGNOSIS — D509 Iron deficiency anemia, unspecified: Secondary | ICD-10-CM | POA: Diagnosis not present

## 2023-04-22 DIAGNOSIS — N2581 Secondary hyperparathyroidism of renal origin: Secondary | ICD-10-CM | POA: Diagnosis not present

## 2023-04-22 DIAGNOSIS — N186 End stage renal disease: Secondary | ICD-10-CM | POA: Diagnosis not present

## 2023-04-22 DIAGNOSIS — D631 Anemia in chronic kidney disease: Secondary | ICD-10-CM | POA: Diagnosis not present

## 2023-04-22 DIAGNOSIS — Z23 Encounter for immunization: Secondary | ICD-10-CM | POA: Diagnosis not present

## 2023-04-24 DIAGNOSIS — D509 Iron deficiency anemia, unspecified: Secondary | ICD-10-CM | POA: Diagnosis not present

## 2023-04-24 DIAGNOSIS — D631 Anemia in chronic kidney disease: Secondary | ICD-10-CM | POA: Diagnosis not present

## 2023-04-24 DIAGNOSIS — N2581 Secondary hyperparathyroidism of renal origin: Secondary | ICD-10-CM | POA: Diagnosis not present

## 2023-04-24 DIAGNOSIS — N186 End stage renal disease: Secondary | ICD-10-CM | POA: Diagnosis not present

## 2023-04-24 DIAGNOSIS — Z23 Encounter for immunization: Secondary | ICD-10-CM | POA: Diagnosis not present

## 2023-04-26 ENCOUNTER — Other Ambulatory Visit: Payer: Self-pay | Admitting: Internal Medicine

## 2023-04-27 ENCOUNTER — Encounter: Payer: Medicare PPO | Admitting: Internal Medicine

## 2023-04-27 DIAGNOSIS — Z23 Encounter for immunization: Secondary | ICD-10-CM | POA: Diagnosis not present

## 2023-04-27 DIAGNOSIS — N186 End stage renal disease: Secondary | ICD-10-CM | POA: Diagnosis not present

## 2023-04-27 DIAGNOSIS — N2581 Secondary hyperparathyroidism of renal origin: Secondary | ICD-10-CM | POA: Diagnosis not present

## 2023-04-27 DIAGNOSIS — D631 Anemia in chronic kidney disease: Secondary | ICD-10-CM | POA: Diagnosis not present

## 2023-04-27 DIAGNOSIS — D509 Iron deficiency anemia, unspecified: Secondary | ICD-10-CM | POA: Diagnosis not present

## 2023-04-28 ENCOUNTER — Ambulatory Visit: Payer: Medicare PPO | Admitting: Internal Medicine

## 2023-04-28 ENCOUNTER — Encounter: Payer: Self-pay | Admitting: Internal Medicine

## 2023-04-28 VITALS — BP 110/70 | HR 78 | Temp 97.8°F | Ht 67.0 in | Wt 229.6 lb

## 2023-04-28 DIAGNOSIS — E2839 Other primary ovarian failure: Secondary | ICD-10-CM | POA: Diagnosis not present

## 2023-04-28 DIAGNOSIS — E7800 Pure hypercholesterolemia, unspecified: Secondary | ICD-10-CM | POA: Diagnosis not present

## 2023-04-28 DIAGNOSIS — E66812 Obesity, class 2: Secondary | ICD-10-CM

## 2023-04-28 DIAGNOSIS — E1122 Type 2 diabetes mellitus with diabetic chronic kidney disease: Secondary | ICD-10-CM

## 2023-04-28 DIAGNOSIS — Z0000 Encounter for general adult medical examination without abnormal findings: Secondary | ICD-10-CM

## 2023-04-28 DIAGNOSIS — E113599 Type 2 diabetes mellitus with proliferative diabetic retinopathy without macular edema, unspecified eye: Secondary | ICD-10-CM

## 2023-04-28 DIAGNOSIS — N2581 Secondary hyperparathyroidism of renal origin: Secondary | ICD-10-CM

## 2023-04-28 DIAGNOSIS — Z992 Dependence on renal dialysis: Secondary | ICD-10-CM | POA: Diagnosis not present

## 2023-04-28 DIAGNOSIS — Z6835 Body mass index (BMI) 35.0-35.9, adult: Secondary | ICD-10-CM

## 2023-04-28 DIAGNOSIS — N186 End stage renal disease: Secondary | ICD-10-CM

## 2023-04-28 DIAGNOSIS — Z23 Encounter for immunization: Secondary | ICD-10-CM

## 2023-04-28 DIAGNOSIS — I120 Hypertensive chronic kidney disease with stage 5 chronic kidney disease or end stage renal disease: Secondary | ICD-10-CM | POA: Diagnosis not present

## 2023-04-28 NOTE — Patient Instructions (Signed)

## 2023-04-29 DIAGNOSIS — N2581 Secondary hyperparathyroidism of renal origin: Secondary | ICD-10-CM | POA: Diagnosis not present

## 2023-04-29 DIAGNOSIS — D509 Iron deficiency anemia, unspecified: Secondary | ICD-10-CM | POA: Diagnosis not present

## 2023-04-29 DIAGNOSIS — E119 Type 2 diabetes mellitus without complications: Secondary | ICD-10-CM | POA: Diagnosis not present

## 2023-04-29 DIAGNOSIS — N186 End stage renal disease: Secondary | ICD-10-CM | POA: Diagnosis not present

## 2023-04-29 DIAGNOSIS — D631 Anemia in chronic kidney disease: Secondary | ICD-10-CM | POA: Diagnosis not present

## 2023-04-30 ENCOUNTER — Encounter: Payer: Medicare PPO | Admitting: Internal Medicine

## 2023-04-30 ENCOUNTER — Ambulatory Visit: Payer: Self-pay

## 2023-04-30 NOTE — Patient Outreach (Signed)
Care Coordination   04/30/2023 Name: Jennifer Payne MRN: [id number] DOB: 1951/05/20   Care Coordination Outreach Attempts:  An unsuccessful telephone outreach was attempted for a scheduled appointment today.  Follow Up Plan:  Additional outreach attempts will be made to offer the patient care coordination information and services.   Encounter Outcome:  Patient Request to Call Back   Care Coordination Interventions:  No, not indicated    Delsa Sale RN BSN CCM [place]  [facility] Health Nurse Care Coordinator  Direct Dial: [phone] Website: [email or web address]

## 2023-05-01 DIAGNOSIS — D509 Iron deficiency anemia, unspecified: Secondary | ICD-10-CM | POA: Diagnosis not present

## 2023-05-01 DIAGNOSIS — D631 Anemia in chronic kidney disease: Secondary | ICD-10-CM | POA: Diagnosis not present

## 2023-05-01 DIAGNOSIS — N2581 Secondary hyperparathyroidism of renal origin: Secondary | ICD-10-CM | POA: Diagnosis not present

## 2023-05-01 DIAGNOSIS — N186 End stage renal disease: Secondary | ICD-10-CM | POA: Diagnosis not present

## 2023-05-01 LAB — CMP14+EGFR
ALT: 9 [IU]/L (ref 0–32)
AST: 11 [IU]/L (ref 0–40)
Albumin: 3.9 g/dL (ref 3.8–4.8)
Alkaline Phosphatase: 213 [IU]/L — ABNORMAL HIGH (ref 44–121)
BUN/Creatinine Ratio: 5 — ABNORMAL LOW (ref 12–28)
BUN: 35 mg/dL — ABNORMAL HIGH (ref 8–27)
Bilirubin Total: 0.4 mg/dL (ref 0.0–1.2)
CO2: 28 mmol/L (ref 20–29)
Calcium: 9.2 mg/dL (ref 8.7–10.3)
Chloride: 96 mmol/L (ref 96–106)
Creatinine, Ser: 7.71 mg/dL — ABNORMAL HIGH (ref 0.57–1.00)
Globulin, Total: 3.2 g/dL (ref 1.5–4.5)
Glucose: 154 mg/dL — ABNORMAL HIGH (ref 70–99)
Potassium: 5.8 mmol/L — ABNORMAL HIGH (ref 3.5–5.2)
Sodium: 137 mmol/L (ref 134–144)
Total Protein: 7.1 g/dL (ref 6.0–8.5)
eGFR: 5 mL/min/{1.73_m2} — ABNORMAL LOW (ref >59)

## 2023-05-01 LAB — PROTEIN ELECTROPHORESIS, SERUM
A/G Ratio: 1 (ref 0.7–1.7)
Albumin ELP: 3.6 g/dL (ref 2.9–4.4)
Alpha 1: 0.2 g/dL (ref 0.0–0.4)
Alpha 2: 0.7 g/dL (ref 0.4–1.0)
Beta: 0.7 g/dL (ref 0.7–1.3)
Gamma Globulin: 1.8 g/dL (ref 0.4–1.8)
Globulin, Total: 3.5 g/dL (ref 2.2–3.9)

## 2023-05-01 LAB — PTH, INTACT AND CALCIUM: PTH: 372 pg/mL — ABNORMAL HIGH (ref 15–65)

## 2023-05-01 LAB — HEMOGLOBIN A1C
Est. average glucose Bld gHb Est-mCnc: 171 mg/dL
Hgb A1c MFr Bld: 7.6 % — ABNORMAL HIGH (ref 4.8–5.6)

## 2023-05-01 LAB — PHOSPHORUS: Phosphorus: 3.7 mg/dL (ref 3.0–4.3)

## 2023-05-01 LAB — TSH+FREE T4
Free T4: 1.36 ng/dL (ref 0.82–1.77)
TSH: 3.33 u[IU]/mL (ref 0.450–4.500)

## 2023-05-04 DIAGNOSIS — D631 Anemia in chronic kidney disease: Secondary | ICD-10-CM | POA: Diagnosis not present

## 2023-05-04 DIAGNOSIS — D509 Iron deficiency anemia, unspecified: Secondary | ICD-10-CM | POA: Diagnosis not present

## 2023-05-04 DIAGNOSIS — N2581 Secondary hyperparathyroidism of renal origin: Secondary | ICD-10-CM | POA: Diagnosis not present

## 2023-05-04 DIAGNOSIS — N186 End stage renal disease: Secondary | ICD-10-CM | POA: Diagnosis not present

## 2023-05-05 NOTE — Assessment & Plan Note (Signed)

## 2023-05-05 NOTE — Assessment & Plan Note (Signed)
She is encouraged to strive for BMI less than 30 to decrease cardiac risk. Advised to aim for at least 150 minutes of exercise per week.  

## 2023-05-05 NOTE — Assessment & Plan Note (Signed)
Chronic, encouraged to keep regular ophthalmology appts and f/u as needed. Importance of glycemic control also discussed with the patient.

## 2023-05-05 NOTE — Assessment & Plan Note (Signed)
Chronic, diabetic foot exam was performed. She will f/u in four months for re-evaluation. I DISCUSSED WITH THE PATIENT AT LENGTH REGARDING THE GOALS OF GLYCEMIC CONTROL AND POSSIBLE LONG-TERM COMPLICATIONS.  I  ALSO STRESSED THE IMPORTANCE OF COMPLIANCE WITH HOME GLUCOSE MONITORING, DIETARY RESTRICTIONS INCLUDING AVOIDANCE OF SUGARY DRINKS/PROCESSED FOODS,  ALONG WITH REGULAR EXERCISE.  I  ALSO STRESSED THE IMPORTANCE OF ANNUAL EYE EXAMS, SELF FOOT CARE AND COMPLIANCE WITH OFFICE VISITS.

## 2023-05-05 NOTE — Assessment & Plan Note (Signed)
Chronic, LDL goal is less than 70.  She will continue with atorvastatin 80mg  daily.

## 2023-05-06 DIAGNOSIS — D631 Anemia in chronic kidney disease: Secondary | ICD-10-CM | POA: Diagnosis not present

## 2023-05-06 DIAGNOSIS — N2581 Secondary hyperparathyroidism of renal origin: Secondary | ICD-10-CM | POA: Diagnosis not present

## 2023-05-06 DIAGNOSIS — N186 End stage renal disease: Secondary | ICD-10-CM | POA: Diagnosis not present

## 2023-05-06 DIAGNOSIS — D509 Iron deficiency anemia, unspecified: Secondary | ICD-10-CM | POA: Diagnosis not present

## 2023-05-08 DIAGNOSIS — N2581 Secondary hyperparathyroidism of renal origin: Secondary | ICD-10-CM | POA: Diagnosis not present

## 2023-05-08 DIAGNOSIS — N186 End stage renal disease: Secondary | ICD-10-CM | POA: Diagnosis not present

## 2023-05-08 DIAGNOSIS — D509 Iron deficiency anemia, unspecified: Secondary | ICD-10-CM | POA: Diagnosis not present

## 2023-05-08 DIAGNOSIS — D631 Anemia in chronic kidney disease: Secondary | ICD-10-CM | POA: Diagnosis not present

## 2023-05-11 DIAGNOSIS — N2581 Secondary hyperparathyroidism of renal origin: Secondary | ICD-10-CM | POA: Diagnosis not present

## 2023-05-11 DIAGNOSIS — N186 End stage renal disease: Secondary | ICD-10-CM | POA: Diagnosis not present

## 2023-05-11 DIAGNOSIS — D631 Anemia in chronic kidney disease: Secondary | ICD-10-CM | POA: Diagnosis not present

## 2023-05-11 DIAGNOSIS — D509 Iron deficiency anemia, unspecified: Secondary | ICD-10-CM | POA: Diagnosis not present

## 2023-05-13 DIAGNOSIS — D631 Anemia in chronic kidney disease: Secondary | ICD-10-CM | POA: Diagnosis not present

## 2023-05-13 DIAGNOSIS — N2581 Secondary hyperparathyroidism of renal origin: Secondary | ICD-10-CM | POA: Diagnosis not present

## 2023-05-13 DIAGNOSIS — D509 Iron deficiency anemia, unspecified: Secondary | ICD-10-CM | POA: Diagnosis not present

## 2023-05-13 DIAGNOSIS — N186 End stage renal disease: Secondary | ICD-10-CM | POA: Diagnosis not present

## 2023-05-14 ENCOUNTER — Ambulatory Visit: Payer: Self-pay

## 2023-05-14 NOTE — Patient Outreach (Signed)
Care Coordination   05/14/2023 Name: Jennifer Payne MRN: [id number] DOB: [birth year]   Care Coordination Outreach Attempts:  An unsuccessful telephone outreach was attempted for a scheduled appointment today.  Follow Up Plan:  Additional outreach attempts will be made to offer the patient care coordination information and services.   Encounter Outcome:  No Answer   Care Coordination Interventions:  No, not indicated    Delsa Sale RN BSN CCM [place]  [facility] Health Nurse Care Coordinator  Direct Dial: [phone] Website: [email or web address]

## 2023-05-15 DIAGNOSIS — N186 End stage renal disease: Secondary | ICD-10-CM | POA: Diagnosis not present

## 2023-05-15 DIAGNOSIS — D509 Iron deficiency anemia, unspecified: Secondary | ICD-10-CM | POA: Diagnosis not present

## 2023-05-15 DIAGNOSIS — D631 Anemia in chronic kidney disease: Secondary | ICD-10-CM | POA: Diagnosis not present

## 2023-05-15 DIAGNOSIS — N2581 Secondary hyperparathyroidism of renal origin: Secondary | ICD-10-CM | POA: Diagnosis not present

## 2023-05-18 DIAGNOSIS — D509 Iron deficiency anemia, unspecified: Secondary | ICD-10-CM | POA: Diagnosis not present

## 2023-05-18 DIAGNOSIS — D631 Anemia in chronic kidney disease: Secondary | ICD-10-CM | POA: Diagnosis not present

## 2023-05-18 DIAGNOSIS — N2581 Secondary hyperparathyroidism of renal origin: Secondary | ICD-10-CM | POA: Diagnosis not present

## 2023-05-18 DIAGNOSIS — N186 End stage renal disease: Secondary | ICD-10-CM | POA: Diagnosis not present

## 2023-05-20 DIAGNOSIS — N2581 Secondary hyperparathyroidism of renal origin: Secondary | ICD-10-CM | POA: Diagnosis not present

## 2023-05-20 DIAGNOSIS — N186 End stage renal disease: Secondary | ICD-10-CM | POA: Diagnosis not present

## 2023-05-20 DIAGNOSIS — D509 Iron deficiency anemia, unspecified: Secondary | ICD-10-CM | POA: Diagnosis not present

## 2023-05-20 DIAGNOSIS — D631 Anemia in chronic kidney disease: Secondary | ICD-10-CM | POA: Diagnosis not present

## 2023-05-22 DIAGNOSIS — D631 Anemia in chronic kidney disease: Secondary | ICD-10-CM | POA: Diagnosis not present

## 2023-05-22 DIAGNOSIS — N2581 Secondary hyperparathyroidism of renal origin: Secondary | ICD-10-CM | POA: Diagnosis not present

## 2023-05-22 DIAGNOSIS — D509 Iron deficiency anemia, unspecified: Secondary | ICD-10-CM | POA: Diagnosis not present

## 2023-05-22 DIAGNOSIS — N186 End stage renal disease: Secondary | ICD-10-CM | POA: Diagnosis not present

## 2023-05-25 DIAGNOSIS — D631 Anemia in chronic kidney disease: Secondary | ICD-10-CM | POA: Diagnosis not present

## 2023-05-25 DIAGNOSIS — N2581 Secondary hyperparathyroidism of renal origin: Secondary | ICD-10-CM | POA: Diagnosis not present

## 2023-05-25 DIAGNOSIS — N186 End stage renal disease: Secondary | ICD-10-CM | POA: Diagnosis not present

## 2023-05-25 DIAGNOSIS — D509 Iron deficiency anemia, unspecified: Secondary | ICD-10-CM | POA: Diagnosis not present

## 2023-05-27 DIAGNOSIS — N2581 Secondary hyperparathyroidism of renal origin: Secondary | ICD-10-CM | POA: Diagnosis not present

## 2023-05-27 DIAGNOSIS — D509 Iron deficiency anemia, unspecified: Secondary | ICD-10-CM | POA: Diagnosis not present

## 2023-05-27 DIAGNOSIS — D631 Anemia in chronic kidney disease: Secondary | ICD-10-CM | POA: Diagnosis not present

## 2023-05-27 DIAGNOSIS — N186 End stage renal disease: Secondary | ICD-10-CM | POA: Diagnosis not present

## 2023-05-28 DIAGNOSIS — N186 End stage renal disease: Secondary | ICD-10-CM | POA: Diagnosis not present

## 2023-05-28 DIAGNOSIS — Z992 Dependence on renal dialysis: Secondary | ICD-10-CM | POA: Diagnosis not present

## 2023-05-30 DIAGNOSIS — D631 Anemia in chronic kidney disease: Secondary | ICD-10-CM | POA: Diagnosis not present

## 2023-05-30 DIAGNOSIS — N186 End stage renal disease: Secondary | ICD-10-CM | POA: Diagnosis not present

## 2023-05-30 DIAGNOSIS — D509 Iron deficiency anemia, unspecified: Secondary | ICD-10-CM | POA: Diagnosis not present

## 2023-05-30 DIAGNOSIS — N2581 Secondary hyperparathyroidism of renal origin: Secondary | ICD-10-CM | POA: Diagnosis not present

## 2023-06-01 DIAGNOSIS — D631 Anemia in chronic kidney disease: Secondary | ICD-10-CM | POA: Diagnosis not present

## 2023-06-01 DIAGNOSIS — D509 Iron deficiency anemia, unspecified: Secondary | ICD-10-CM | POA: Diagnosis not present

## 2023-06-01 DIAGNOSIS — N186 End stage renal disease: Secondary | ICD-10-CM | POA: Diagnosis not present

## 2023-06-01 DIAGNOSIS — N2581 Secondary hyperparathyroidism of renal origin: Secondary | ICD-10-CM | POA: Diagnosis not present

## 2023-06-01 NOTE — Assessment & Plan Note (Addendum)
Chronic, due to ESRD. She is also followed by Nephrology. Will check PTH level today.

## 2023-06-03 ENCOUNTER — Ambulatory Visit: Payer: Self-pay

## 2023-06-03 DIAGNOSIS — E119 Type 2 diabetes mellitus without complications: Secondary | ICD-10-CM | POA: Diagnosis not present

## 2023-06-03 DIAGNOSIS — N2581 Secondary hyperparathyroidism of renal origin: Secondary | ICD-10-CM | POA: Diagnosis not present

## 2023-06-03 DIAGNOSIS — D631 Anemia in chronic kidney disease: Secondary | ICD-10-CM | POA: Diagnosis not present

## 2023-06-03 DIAGNOSIS — D509 Iron deficiency anemia, unspecified: Secondary | ICD-10-CM | POA: Diagnosis not present

## 2023-06-03 DIAGNOSIS — N186 End stage renal disease: Secondary | ICD-10-CM | POA: Diagnosis not present

## 2023-06-03 NOTE — Patient Outreach (Signed)
  Care Coordination   06/03/2023 Name: Jennifer Payne MRN: [id number] DOB: 1951/06/01   Care Coordination Outreach Attempts:  An unsuccessful telephone outreach was attempted for a scheduled appointment today.  Follow Up Plan:  No further outreach attempts will be made at this time. We have been unable to contact the patient to offer or enroll patient in care coordination services  Encounter Outcome:  No Answer   Care Coordination Interventions:  No, not indicated    Delsa Sale RN BSN CCM [place]  [facility], [facility] Health Nurse Care Coordinator  Direct Dial: [phone] Website: [email or web address]

## 2023-06-05 DIAGNOSIS — N186 End stage renal disease: Secondary | ICD-10-CM | POA: Diagnosis not present

## 2023-06-05 DIAGNOSIS — D631 Anemia in chronic kidney disease: Secondary | ICD-10-CM | POA: Diagnosis not present

## 2023-06-05 DIAGNOSIS — D509 Iron deficiency anemia, unspecified: Secondary | ICD-10-CM | POA: Diagnosis not present

## 2023-06-05 DIAGNOSIS — N2581 Secondary hyperparathyroidism of renal origin: Secondary | ICD-10-CM | POA: Diagnosis not present

## 2023-06-08 DIAGNOSIS — D509 Iron deficiency anemia, unspecified: Secondary | ICD-10-CM | POA: Diagnosis not present

## 2023-06-08 DIAGNOSIS — N186 End stage renal disease: Secondary | ICD-10-CM | POA: Diagnosis not present

## 2023-06-08 DIAGNOSIS — N2581 Secondary hyperparathyroidism of renal origin: Secondary | ICD-10-CM | POA: Diagnosis not present

## 2023-06-08 DIAGNOSIS — D631 Anemia in chronic kidney disease: Secondary | ICD-10-CM | POA: Diagnosis not present

## 2023-06-10 DIAGNOSIS — N2581 Secondary hyperparathyroidism of renal origin: Secondary | ICD-10-CM | POA: Diagnosis not present

## 2023-06-10 DIAGNOSIS — N186 End stage renal disease: Secondary | ICD-10-CM | POA: Diagnosis not present

## 2023-06-10 DIAGNOSIS — D509 Iron deficiency anemia, unspecified: Secondary | ICD-10-CM | POA: Diagnosis not present

## 2023-06-10 DIAGNOSIS — D631 Anemia in chronic kidney disease: Secondary | ICD-10-CM | POA: Diagnosis not present

## 2023-06-12 DIAGNOSIS — D509 Iron deficiency anemia, unspecified: Secondary | ICD-10-CM | POA: Diagnosis not present

## 2023-06-12 DIAGNOSIS — N2581 Secondary hyperparathyroidism of renal origin: Secondary | ICD-10-CM | POA: Diagnosis not present

## 2023-06-12 DIAGNOSIS — D631 Anemia in chronic kidney disease: Secondary | ICD-10-CM | POA: Diagnosis not present

## 2023-06-12 DIAGNOSIS — N186 End stage renal disease: Secondary | ICD-10-CM | POA: Diagnosis not present

## 2023-06-15 DIAGNOSIS — D631 Anemia in chronic kidney disease: Secondary | ICD-10-CM | POA: Diagnosis not present

## 2023-06-15 DIAGNOSIS — N2581 Secondary hyperparathyroidism of renal origin: Secondary | ICD-10-CM | POA: Diagnosis not present

## 2023-06-15 DIAGNOSIS — N186 End stage renal disease: Secondary | ICD-10-CM | POA: Diagnosis not present

## 2023-06-15 DIAGNOSIS — D509 Iron deficiency anemia, unspecified: Secondary | ICD-10-CM | POA: Diagnosis not present

## 2023-06-16 ENCOUNTER — Ambulatory Visit: Payer: Medicare PPO

## 2023-06-16 ENCOUNTER — Ambulatory Visit
Admission: RE | Admit: 2023-06-16 | Discharge: 2023-06-16 | Disposition: A | Discharge status: Home / Self Care | Payer: Medicare PPO | Source: Ambulatory Visit | Attending: Internal Medicine | Admitting: Internal Medicine

## 2023-06-16 DIAGNOSIS — Z1231 Encounter for screening mammogram for malignant neoplasm of breast: Secondary | ICD-10-CM | POA: Diagnosis not present

## 2023-06-17 DIAGNOSIS — N186 End stage renal disease: Secondary | ICD-10-CM | POA: Diagnosis not present

## 2023-06-17 DIAGNOSIS — N2581 Secondary hyperparathyroidism of renal origin: Secondary | ICD-10-CM | POA: Diagnosis not present

## 2023-06-17 DIAGNOSIS — D509 Iron deficiency anemia, unspecified: Secondary | ICD-10-CM | POA: Diagnosis not present

## 2023-06-17 DIAGNOSIS — D631 Anemia in chronic kidney disease: Secondary | ICD-10-CM | POA: Diagnosis not present

## 2023-06-19 DIAGNOSIS — N186 End stage renal disease: Secondary | ICD-10-CM | POA: Diagnosis not present

## 2023-06-19 DIAGNOSIS — D509 Iron deficiency anemia, unspecified: Secondary | ICD-10-CM | POA: Diagnosis not present

## 2023-06-19 DIAGNOSIS — N2581 Secondary hyperparathyroidism of renal origin: Secondary | ICD-10-CM | POA: Diagnosis not present

## 2023-06-19 DIAGNOSIS — D631 Anemia in chronic kidney disease: Secondary | ICD-10-CM | POA: Diagnosis not present

## 2023-06-21 DIAGNOSIS — N2581 Secondary hyperparathyroidism of renal origin: Secondary | ICD-10-CM | POA: Diagnosis not present

## 2023-06-21 DIAGNOSIS — D631 Anemia in chronic kidney disease: Secondary | ICD-10-CM | POA: Diagnosis not present

## 2023-06-21 DIAGNOSIS — D509 Iron deficiency anemia, unspecified: Secondary | ICD-10-CM | POA: Diagnosis not present

## 2023-06-21 DIAGNOSIS — N186 End stage renal disease: Secondary | ICD-10-CM | POA: Diagnosis not present

## 2023-06-23 DIAGNOSIS — D509 Iron deficiency anemia, unspecified: Secondary | ICD-10-CM | POA: Diagnosis not present

## 2023-06-23 DIAGNOSIS — D631 Anemia in chronic kidney disease: Secondary | ICD-10-CM | POA: Diagnosis not present

## 2023-06-23 DIAGNOSIS — N186 End stage renal disease: Secondary | ICD-10-CM | POA: Diagnosis not present

## 2023-06-23 DIAGNOSIS — N2581 Secondary hyperparathyroidism of renal origin: Secondary | ICD-10-CM | POA: Diagnosis not present

## 2023-06-26 DIAGNOSIS — D631 Anemia in chronic kidney disease: Secondary | ICD-10-CM | POA: Diagnosis not present

## 2023-06-26 DIAGNOSIS — D509 Iron deficiency anemia, unspecified: Secondary | ICD-10-CM | POA: Diagnosis not present

## 2023-06-26 DIAGNOSIS — N186 End stage renal disease: Secondary | ICD-10-CM | POA: Diagnosis not present

## 2023-06-26 DIAGNOSIS — N2581 Secondary hyperparathyroidism of renal origin: Secondary | ICD-10-CM | POA: Diagnosis not present

## 2023-06-29 DIAGNOSIS — N186 End stage renal disease: Secondary | ICD-10-CM | POA: Diagnosis not present

## 2023-06-29 DIAGNOSIS — N2581 Secondary hyperparathyroidism of renal origin: Secondary | ICD-10-CM | POA: Diagnosis not present

## 2023-06-29 DIAGNOSIS — D509 Iron deficiency anemia, unspecified: Secondary | ICD-10-CM | POA: Diagnosis not present

## 2023-07-02 DIAGNOSIS — E119 Type 2 diabetes mellitus without complications: Secondary | ICD-10-CM | POA: Diagnosis not present

## 2023-07-02 DIAGNOSIS — N186 End stage renal disease: Secondary | ICD-10-CM | POA: Diagnosis not present

## 2023-07-02 DIAGNOSIS — N2581 Secondary hyperparathyroidism of renal origin: Secondary | ICD-10-CM | POA: Diagnosis not present

## 2023-07-02 DIAGNOSIS — D509 Iron deficiency anemia, unspecified: Secondary | ICD-10-CM | POA: Diagnosis not present

## 2023-07-03 DIAGNOSIS — D509 Iron deficiency anemia, unspecified: Secondary | ICD-10-CM | POA: Diagnosis not present

## 2023-07-03 DIAGNOSIS — N2581 Secondary hyperparathyroidism of renal origin: Secondary | ICD-10-CM | POA: Diagnosis not present

## 2023-07-03 DIAGNOSIS — N186 End stage renal disease: Secondary | ICD-10-CM | POA: Diagnosis not present

## 2023-07-06 DIAGNOSIS — D509 Iron deficiency anemia, unspecified: Secondary | ICD-10-CM | POA: Diagnosis not present

## 2023-07-06 DIAGNOSIS — N186 End stage renal disease: Secondary | ICD-10-CM | POA: Diagnosis not present

## 2023-07-06 DIAGNOSIS — N2581 Secondary hyperparathyroidism of renal origin: Secondary | ICD-10-CM | POA: Diagnosis not present

## 2023-07-08 DIAGNOSIS — N2581 Secondary hyperparathyroidism of renal origin: Secondary | ICD-10-CM | POA: Diagnosis not present

## 2023-07-08 DIAGNOSIS — N186 End stage renal disease: Secondary | ICD-10-CM | POA: Diagnosis not present

## 2023-07-08 DIAGNOSIS — D509 Iron deficiency anemia, unspecified: Secondary | ICD-10-CM | POA: Diagnosis not present

## 2023-07-10 DIAGNOSIS — N2581 Secondary hyperparathyroidism of renal origin: Secondary | ICD-10-CM | POA: Diagnosis not present

## 2023-07-10 DIAGNOSIS — D509 Iron deficiency anemia, unspecified: Secondary | ICD-10-CM | POA: Diagnosis not present

## 2023-07-10 DIAGNOSIS — N186 End stage renal disease: Secondary | ICD-10-CM | POA: Diagnosis not present

## 2023-07-13 DIAGNOSIS — D509 Iron deficiency anemia, unspecified: Secondary | ICD-10-CM | POA: Diagnosis not present

## 2023-07-13 DIAGNOSIS — N186 End stage renal disease: Secondary | ICD-10-CM | POA: Diagnosis not present

## 2023-07-13 DIAGNOSIS — N2581 Secondary hyperparathyroidism of renal origin: Secondary | ICD-10-CM | POA: Diagnosis not present

## 2023-07-15 DIAGNOSIS — N186 End stage renal disease: Secondary | ICD-10-CM | POA: Diagnosis not present

## 2023-07-15 DIAGNOSIS — N2581 Secondary hyperparathyroidism of renal origin: Secondary | ICD-10-CM | POA: Diagnosis not present

## 2023-07-15 DIAGNOSIS — D509 Iron deficiency anemia, unspecified: Secondary | ICD-10-CM | POA: Diagnosis not present

## 2023-07-17 DIAGNOSIS — N2581 Secondary hyperparathyroidism of renal origin: Secondary | ICD-10-CM | POA: Diagnosis not present

## 2023-07-17 DIAGNOSIS — N186 End stage renal disease: Secondary | ICD-10-CM | POA: Diagnosis not present

## 2023-07-17 DIAGNOSIS — D509 Iron deficiency anemia, unspecified: Secondary | ICD-10-CM | POA: Diagnosis not present

## 2023-07-19 DIAGNOSIS — N2581 Secondary hyperparathyroidism of renal origin: Secondary | ICD-10-CM | POA: Diagnosis not present

## 2023-07-19 DIAGNOSIS — N186 End stage renal disease: Secondary | ICD-10-CM | POA: Diagnosis not present

## 2023-07-19 DIAGNOSIS — D509 Iron deficiency anemia, unspecified: Secondary | ICD-10-CM | POA: Diagnosis not present

## 2023-07-21 DIAGNOSIS — N186 End stage renal disease: Secondary | ICD-10-CM | POA: Diagnosis not present

## 2023-07-21 DIAGNOSIS — N2581 Secondary hyperparathyroidism of renal origin: Secondary | ICD-10-CM | POA: Diagnosis not present

## 2023-07-21 DIAGNOSIS — D509 Iron deficiency anemia, unspecified: Secondary | ICD-10-CM | POA: Diagnosis not present

## 2023-07-24 DIAGNOSIS — N2581 Secondary hyperparathyroidism of renal origin: Secondary | ICD-10-CM | POA: Diagnosis not present

## 2023-07-24 DIAGNOSIS — D509 Iron deficiency anemia, unspecified: Secondary | ICD-10-CM | POA: Diagnosis not present

## 2023-07-24 DIAGNOSIS — N186 End stage renal disease: Secondary | ICD-10-CM | POA: Diagnosis not present

## 2023-07-26 DIAGNOSIS — N186 End stage renal disease: Secondary | ICD-10-CM | POA: Diagnosis not present

## 2023-07-26 DIAGNOSIS — D509 Iron deficiency anemia, unspecified: Secondary | ICD-10-CM | POA: Diagnosis not present

## 2023-07-26 DIAGNOSIS — N2581 Secondary hyperparathyroidism of renal origin: Secondary | ICD-10-CM | POA: Diagnosis not present

## 2023-07-28 ENCOUNTER — Other Ambulatory Visit: Payer: Self-pay | Admitting: Internal Medicine

## 2023-07-28 DIAGNOSIS — D509 Iron deficiency anemia, unspecified: Secondary | ICD-10-CM | POA: Diagnosis not present

## 2023-07-28 DIAGNOSIS — N2581 Secondary hyperparathyroidism of renal origin: Secondary | ICD-10-CM | POA: Diagnosis not present

## 2023-07-28 DIAGNOSIS — Z992 Dependence on renal dialysis: Secondary | ICD-10-CM | POA: Diagnosis not present

## 2023-07-28 DIAGNOSIS — N186 End stage renal disease: Secondary | ICD-10-CM | POA: Diagnosis not present

## 2023-07-30 ENCOUNTER — Other Ambulatory Visit: Payer: Self-pay

## 2023-07-30 MED ORDER — LEVOTHYROXINE SODIUM 88 MCG PO TABS: 88.0000 ug | ORAL_TABLET | Freq: Every day | ORAL | 1 refills | Status: DC

## 2023-07-31 DIAGNOSIS — D509 Iron deficiency anemia, unspecified: Secondary | ICD-10-CM | POA: Diagnosis not present

## 2023-07-31 DIAGNOSIS — E785 Hyperlipidemia, unspecified: Secondary | ICD-10-CM | POA: Diagnosis not present

## 2023-07-31 DIAGNOSIS — D631 Anemia in chronic kidney disease: Secondary | ICD-10-CM | POA: Diagnosis not present

## 2023-07-31 DIAGNOSIS — N186 End stage renal disease: Secondary | ICD-10-CM | POA: Diagnosis not present

## 2023-07-31 DIAGNOSIS — N2581 Secondary hyperparathyroidism of renal origin: Secondary | ICD-10-CM | POA: Diagnosis not present

## 2023-08-03 DIAGNOSIS — N2581 Secondary hyperparathyroidism of renal origin: Secondary | ICD-10-CM | POA: Diagnosis not present

## 2023-08-03 DIAGNOSIS — D631 Anemia in chronic kidney disease: Secondary | ICD-10-CM | POA: Diagnosis not present

## 2023-08-03 DIAGNOSIS — N186 End stage renal disease: Secondary | ICD-10-CM | POA: Diagnosis not present

## 2023-08-03 DIAGNOSIS — D509 Iron deficiency anemia, unspecified: Secondary | ICD-10-CM | POA: Diagnosis not present

## 2023-08-03 DIAGNOSIS — E785 Hyperlipidemia, unspecified: Secondary | ICD-10-CM | POA: Diagnosis not present

## 2023-08-04 DIAGNOSIS — E113513 Type 2 diabetes mellitus with proliferative diabetic retinopathy with macular edema, bilateral: Secondary | ICD-10-CM | POA: Diagnosis not present

## 2023-08-04 DIAGNOSIS — E113511 Type 2 diabetes mellitus with proliferative diabetic retinopathy with macular edema, right eye: Secondary | ICD-10-CM | POA: Diagnosis not present

## 2023-08-04 DIAGNOSIS — H31091 Other chorioretinal scars, right eye: Secondary | ICD-10-CM | POA: Diagnosis not present

## 2023-08-05 DIAGNOSIS — N2581 Secondary hyperparathyroidism of renal origin: Secondary | ICD-10-CM | POA: Diagnosis not present

## 2023-08-05 DIAGNOSIS — E039 Hypothyroidism, unspecified: Secondary | ICD-10-CM | POA: Diagnosis not present

## 2023-08-05 DIAGNOSIS — N186 End stage renal disease: Secondary | ICD-10-CM | POA: Diagnosis not present

## 2023-08-05 DIAGNOSIS — E119 Type 2 diabetes mellitus without complications: Secondary | ICD-10-CM | POA: Diagnosis not present

## 2023-08-05 DIAGNOSIS — D509 Iron deficiency anemia, unspecified: Secondary | ICD-10-CM | POA: Diagnosis not present

## 2023-08-05 DIAGNOSIS — E785 Hyperlipidemia, unspecified: Secondary | ICD-10-CM | POA: Diagnosis not present

## 2023-08-05 DIAGNOSIS — D631 Anemia in chronic kidney disease: Secondary | ICD-10-CM | POA: Diagnosis not present

## 2023-08-07 DIAGNOSIS — D631 Anemia in chronic kidney disease: Secondary | ICD-10-CM | POA: Diagnosis not present

## 2023-08-07 DIAGNOSIS — E785 Hyperlipidemia, unspecified: Secondary | ICD-10-CM | POA: Diagnosis not present

## 2023-08-07 DIAGNOSIS — N2581 Secondary hyperparathyroidism of renal origin: Secondary | ICD-10-CM | POA: Diagnosis not present

## 2023-08-07 DIAGNOSIS — D509 Iron deficiency anemia, unspecified: Secondary | ICD-10-CM | POA: Diagnosis not present

## 2023-08-07 DIAGNOSIS — N186 End stage renal disease: Secondary | ICD-10-CM | POA: Diagnosis not present

## 2023-08-10 DIAGNOSIS — D509 Iron deficiency anemia, unspecified: Secondary | ICD-10-CM | POA: Diagnosis not present

## 2023-08-10 DIAGNOSIS — N186 End stage renal disease: Secondary | ICD-10-CM | POA: Diagnosis not present

## 2023-08-10 DIAGNOSIS — E785 Hyperlipidemia, unspecified: Secondary | ICD-10-CM | POA: Diagnosis not present

## 2023-08-10 DIAGNOSIS — N2581 Secondary hyperparathyroidism of renal origin: Secondary | ICD-10-CM | POA: Diagnosis not present

## 2023-08-10 DIAGNOSIS — D631 Anemia in chronic kidney disease: Secondary | ICD-10-CM | POA: Diagnosis not present

## 2023-08-11 ENCOUNTER — Other Ambulatory Visit: Payer: Self-pay | Admitting: Internal Medicine

## 2023-08-11 DIAGNOSIS — E1122 Type 2 diabetes mellitus with diabetic chronic kidney disease: Secondary | ICD-10-CM

## 2023-08-12 DIAGNOSIS — N186 End stage renal disease: Secondary | ICD-10-CM | POA: Diagnosis not present

## 2023-08-12 DIAGNOSIS — D631 Anemia in chronic kidney disease: Secondary | ICD-10-CM | POA: Diagnosis not present

## 2023-08-12 DIAGNOSIS — D509 Iron deficiency anemia, unspecified: Secondary | ICD-10-CM | POA: Diagnosis not present

## 2023-08-12 DIAGNOSIS — E785 Hyperlipidemia, unspecified: Secondary | ICD-10-CM | POA: Diagnosis not present

## 2023-08-12 DIAGNOSIS — N2581 Secondary hyperparathyroidism of renal origin: Secondary | ICD-10-CM | POA: Diagnosis not present

## 2023-08-14 ENCOUNTER — Other Ambulatory Visit: Payer: Self-pay

## 2023-08-14 DIAGNOSIS — D509 Iron deficiency anemia, unspecified: Secondary | ICD-10-CM | POA: Diagnosis not present

## 2023-08-14 DIAGNOSIS — D631 Anemia in chronic kidney disease: Secondary | ICD-10-CM | POA: Diagnosis not present

## 2023-08-14 DIAGNOSIS — N2581 Secondary hyperparathyroidism of renal origin: Secondary | ICD-10-CM | POA: Diagnosis not present

## 2023-08-14 DIAGNOSIS — N186 End stage renal disease: Secondary | ICD-10-CM | POA: Diagnosis not present

## 2023-08-14 DIAGNOSIS — E785 Hyperlipidemia, unspecified: Secondary | ICD-10-CM | POA: Diagnosis not present

## 2023-08-14 MED ORDER — LEVOTHYROXINE SODIUM 88 MCG PO TABS: 88.0000 ug | ORAL_TABLET | Freq: Every day | ORAL | 1 refills | Status: DC

## 2023-08-17 DIAGNOSIS — E785 Hyperlipidemia, unspecified: Secondary | ICD-10-CM | POA: Diagnosis not present

## 2023-08-17 DIAGNOSIS — N2581 Secondary hyperparathyroidism of renal origin: Secondary | ICD-10-CM | POA: Diagnosis not present

## 2023-08-17 DIAGNOSIS — D631 Anemia in chronic kidney disease: Secondary | ICD-10-CM | POA: Diagnosis not present

## 2023-08-17 DIAGNOSIS — D509 Iron deficiency anemia, unspecified: Secondary | ICD-10-CM | POA: Diagnosis not present

## 2023-08-17 DIAGNOSIS — N186 End stage renal disease: Secondary | ICD-10-CM | POA: Diagnosis not present

## 2023-08-19 DIAGNOSIS — D509 Iron deficiency anemia, unspecified: Secondary | ICD-10-CM | POA: Diagnosis not present

## 2023-08-19 DIAGNOSIS — N2581 Secondary hyperparathyroidism of renal origin: Secondary | ICD-10-CM | POA: Diagnosis not present

## 2023-08-19 DIAGNOSIS — N186 End stage renal disease: Secondary | ICD-10-CM | POA: Diagnosis not present

## 2023-08-19 DIAGNOSIS — D631 Anemia in chronic kidney disease: Secondary | ICD-10-CM | POA: Diagnosis not present

## 2023-08-19 DIAGNOSIS — E785 Hyperlipidemia, unspecified: Secondary | ICD-10-CM | POA: Diagnosis not present

## 2023-08-21 DIAGNOSIS — N186 End stage renal disease: Secondary | ICD-10-CM | POA: Diagnosis not present

## 2023-08-21 DIAGNOSIS — E785 Hyperlipidemia, unspecified: Secondary | ICD-10-CM | POA: Diagnosis not present

## 2023-08-21 DIAGNOSIS — D631 Anemia in chronic kidney disease: Secondary | ICD-10-CM | POA: Diagnosis not present

## 2023-08-21 DIAGNOSIS — D509 Iron deficiency anemia, unspecified: Secondary | ICD-10-CM | POA: Diagnosis not present

## 2023-08-21 DIAGNOSIS — N2581 Secondary hyperparathyroidism of renal origin: Secondary | ICD-10-CM | POA: Diagnosis not present

## 2023-08-24 DIAGNOSIS — D631 Anemia in chronic kidney disease: Secondary | ICD-10-CM | POA: Diagnosis not present

## 2023-08-24 DIAGNOSIS — N186 End stage renal disease: Secondary | ICD-10-CM | POA: Diagnosis not present

## 2023-08-24 DIAGNOSIS — D509 Iron deficiency anemia, unspecified: Secondary | ICD-10-CM | POA: Diagnosis not present

## 2023-08-24 DIAGNOSIS — N2581 Secondary hyperparathyroidism of renal origin: Secondary | ICD-10-CM | POA: Diagnosis not present

## 2023-08-24 DIAGNOSIS — E785 Hyperlipidemia, unspecified: Secondary | ICD-10-CM | POA: Diagnosis not present

## 2023-08-26 DIAGNOSIS — D631 Anemia in chronic kidney disease: Secondary | ICD-10-CM | POA: Diagnosis not present

## 2023-08-26 DIAGNOSIS — D509 Iron deficiency anemia, unspecified: Secondary | ICD-10-CM | POA: Diagnosis not present

## 2023-08-26 DIAGNOSIS — E785 Hyperlipidemia, unspecified: Secondary | ICD-10-CM | POA: Diagnosis not present

## 2023-08-26 DIAGNOSIS — N186 End stage renal disease: Secondary | ICD-10-CM | POA: Diagnosis not present

## 2023-08-26 DIAGNOSIS — N2581 Secondary hyperparathyroidism of renal origin: Secondary | ICD-10-CM | POA: Diagnosis not present

## 2023-08-28 DIAGNOSIS — D631 Anemia in chronic kidney disease: Secondary | ICD-10-CM | POA: Diagnosis not present

## 2023-08-28 DIAGNOSIS — N2581 Secondary hyperparathyroidism of renal origin: Secondary | ICD-10-CM | POA: Diagnosis not present

## 2023-08-28 DIAGNOSIS — E785 Hyperlipidemia, unspecified: Secondary | ICD-10-CM | POA: Diagnosis not present

## 2023-08-28 DIAGNOSIS — N186 End stage renal disease: Secondary | ICD-10-CM | POA: Diagnosis not present

## 2023-08-28 DIAGNOSIS — D509 Iron deficiency anemia, unspecified: Secondary | ICD-10-CM | POA: Diagnosis not present

## 2023-08-28 DIAGNOSIS — Z992 Dependence on renal dialysis: Secondary | ICD-10-CM | POA: Diagnosis not present

## 2023-08-31 DIAGNOSIS — N186 End stage renal disease: Secondary | ICD-10-CM | POA: Diagnosis not present

## 2023-08-31 DIAGNOSIS — D509 Iron deficiency anemia, unspecified: Secondary | ICD-10-CM | POA: Diagnosis not present

## 2023-08-31 DIAGNOSIS — N2581 Secondary hyperparathyroidism of renal origin: Secondary | ICD-10-CM | POA: Diagnosis not present

## 2023-08-31 DIAGNOSIS — D631 Anemia in chronic kidney disease: Secondary | ICD-10-CM | POA: Diagnosis not present

## 2023-09-01 ENCOUNTER — Ambulatory Visit: Payer: Medicare PPO | Admitting: Internal Medicine

## 2023-09-02 DIAGNOSIS — N2581 Secondary hyperparathyroidism of renal origin: Secondary | ICD-10-CM | POA: Diagnosis not present

## 2023-09-02 DIAGNOSIS — D631 Anemia in chronic kidney disease: Secondary | ICD-10-CM | POA: Diagnosis not present

## 2023-09-02 DIAGNOSIS — E119 Type 2 diabetes mellitus without complications: Secondary | ICD-10-CM | POA: Diagnosis not present

## 2023-09-02 DIAGNOSIS — D509 Iron deficiency anemia, unspecified: Secondary | ICD-10-CM | POA: Diagnosis not present

## 2023-09-02 DIAGNOSIS — N186 End stage renal disease: Secondary | ICD-10-CM | POA: Diagnosis not present

## 2023-09-03 ENCOUNTER — Ambulatory Visit: Payer: Medicare PPO | Admitting: Internal Medicine

## 2023-09-03 ENCOUNTER — Encounter: Payer: Self-pay | Admitting: Internal Medicine

## 2023-09-03 VITALS — BP 108/64 | HR 85 | Temp 98.4°F | Ht 67.0 in | Wt 232.2 lb

## 2023-09-03 DIAGNOSIS — E1122 Type 2 diabetes mellitus with diabetic chronic kidney disease: Secondary | ICD-10-CM | POA: Diagnosis not present

## 2023-09-03 DIAGNOSIS — E2839 Other primary ovarian failure: Secondary | ICD-10-CM | POA: Diagnosis not present

## 2023-09-03 DIAGNOSIS — N186 End stage renal disease: Secondary | ICD-10-CM | POA: Diagnosis not present

## 2023-09-03 DIAGNOSIS — I120 Hypertensive chronic kidney disease with stage 5 chronic kidney disease or end stage renal disease: Secondary | ICD-10-CM

## 2023-09-03 DIAGNOSIS — E11319 Type 2 diabetes mellitus with unspecified diabetic retinopathy without macular edema: Secondary | ICD-10-CM

## 2023-09-03 DIAGNOSIS — E7800 Pure hypercholesterolemia, unspecified: Secondary | ICD-10-CM

## 2023-09-03 DIAGNOSIS — E66812 Obesity, class 2: Secondary | ICD-10-CM | POA: Diagnosis not present

## 2023-09-03 DIAGNOSIS — Z992 Dependence on renal dialysis: Secondary | ICD-10-CM

## 2023-09-03 DIAGNOSIS — Z6836 Body mass index (BMI) 36.0-36.9, adult: Secondary | ICD-10-CM

## 2023-09-03 MED ORDER — TRESIBA FLEXTOUCH 200 UNIT/ML ~~LOC~~ SOPN: PEN_INJECTOR | SUBCUTANEOUS | 0 refills | Status: DC

## 2023-09-03 NOTE — Progress Notes (Signed)
I,Victoria T Deloria Lair, CMA,acting as a Neurosurgeon for Gwynneth Aliment, MD.,have documented all relevant documentation on the behalf of Gwynneth Aliment, MD,as directed by  Gwynneth Aliment, MD while in the presence of Gwynneth Aliment, MD.  Subjective:  Patient ID: Jennifer Payne , female    DOB: 01/07/1951 , 73 y.o.   MRN: [id number]  Chief Complaint  Patient presents with   Diabetes   Hypertension   Hyperlipidemia    HPI  Pt presents today for DM & HTN f/u. She is not taking any medication at this time for her blood sugars.  Patient reports compliance with her other medications.  She denies headaches, chest pain and shortness of breath. She does admit that her blood sugars are above 130s. She admits she has not been exercising regularly.    Diabetes She presents for her follow-up diabetic visit. She has type 2 diabetes mellitus. Her disease course has been stable. There are no hypoglycemic associated symptoms. Pertinent negatives for diabetes include no blurred vision, no chest pain, no polydipsia, no polyphagia and no polyuria. There are no hypoglycemic complications. Diabetic complications include nephropathy and retinopathy. Risk factors for coronary artery disease include diabetes mellitus, dyslipidemia, hypertension, obesity, sedentary lifestyle and post-menopausal. She is following a diabetic diet. She participates in exercise intermittently. An ACE inhibitor/angiotensin II receptor blocker is contraindicated.  Hypertension This is a chronic problem. The current episode started more than 1 year ago. The problem has been gradually improving since onset. The problem is controlled. Pertinent negatives include no blurred vision, chest pain, palpitations or shortness of breath. Past treatments include angiotensin blockers, ACE inhibitors and diuretics. The current treatment provides moderate improvement. Compliance problems include exercise.  Hypertensive end-organ damage includes kidney disease  and retinopathy.  Hyperlipidemia This is a chronic problem. The current episode started more than 1 year ago. Exacerbating diseases include diabetes and obesity. Pertinent negatives include no chest pain or shortness of breath. Current antihyperlipidemic treatment includes statins. Compliance problems include adherence to exercise.      Past Medical History:  Diagnosis Date   Cataract    Diabetes mellitus    Hyperlipidemia    Hypertension    Thyroid disease      Family History  Problem Relation Age of Onset   Alzheimer's disease Mother    Coronary artery disease Mother    Hyperlipidemia Mother    Obesity Mother    Hypertension Father    Colon cancer Neg Hx      Current Outpatient Medications:    atorvastatin (LIPITOR) 80 MG tablet, Take 1 tablet by mouth once daily, Disp: 90 tablet, Rfl: 0   calcium acetate (PHOSLO) 667 MG tablet, TAKE 1 TABLET BY MOUTH ONCE DAILY WITH MEALS, Disp: , Rfl:    insulin degludec (TRESIBA FLEXTOUCH) 200 UNIT/ML FlexTouch Pen, Inject 6 units sq nightly, max dose titration 60 units DX: E11.22, Disp: 9 mL, Rfl: 0   levothyroxine (SYNTHROID) 88 MCG tablet, Take 1 tablet (88 mcg total) by mouth daily., Disp: 90 tablet, Rfl: 1   sevelamer carbonate (RENVELA) 800 MG tablet, TAKE 3 TABLETS BY MOUTH WITH MEALS, Disp: , Rfl:    Allergies  Allergen Reactions   Erythromycin Rash     Review of Systems  Constitutional: Negative.   Eyes:  Negative for blurred vision.  Respiratory: Negative.  Negative for shortness of breath.   Cardiovascular: Negative.  Negative for chest pain and palpitations.  Gastrointestinal: Negative.   Endocrine: Negative for polydipsia, polyphagia and  polyuria.  Neurological: Negative.   Psychiatric/Behavioral: Negative.       Today's Vitals   09/03/23 0924  BP: 108/64  Pulse: 85  Temp: 98.4 F (36.9 C)  SpO2: 98%  Weight: 232 lb 3.2 oz (105.3 kg)  Height: 5\' 7"  (1.702 m)   Body mass index is 36.37 kg/m.  Wt Readings  from Last 3 Encounters:  09/03/23 232 lb 3.2 oz (105.3 kg)  04/28/23 229 lb 9.6 oz (104.1 kg)  01/28/23 229 lb (103.9 kg)     Objective:  Physical Exam Vitals and nursing note reviewed.  Constitutional:      Appearance: Normal appearance.  HENT:     Head: Normocephalic and atraumatic.  Eyes:     Extraocular Movements: Extraocular movements intact.  Cardiovascular:     Rate and Rhythm: Normal rate and regular rhythm.     Heart sounds: Normal heart sounds.  Pulmonary:     Effort: Pulmonary effort is normal.     Breath sounds: Normal breath sounds.  Musculoskeletal:     Cervical back: Normal range of motion.     Right lower leg: Edema present.     Left lower leg: Edema present.  Skin:    General: Skin is warm.  Neurological:     General: No focal deficit present.     Mental Status: She is alert.  Psychiatric:        Mood and Affect: Mood normal.        Behavior: Behavior normal.         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  She agrees to start insulin. I will send rx Tresiba 6 units nightly.  Advised to check her sugars tid. I also requested her to send fasting blood sugar readings every Monday so her insulin dose can be adjusted. She has been on insulin in the past. Once rx approved at the pharmacy, she is advised to RTC for pen teaching if needed.  She will rto in 3 months for re-evaluation.   Orders: -     CBC -     Lipid panel -     Hemoglobin A1c -     Hepatic function panel  Pure hypercholesterolemia Assessment & Plan: Chronic, LDL goal is less than 70.  She will continue with atorvastatin 80mg  daily.   Orders: -     Lipid panel  Estrogen deficiency Assessment & Plan: Bone density is scheduled for May 2025 at the [facility].    Class 2 severe obesity due to excess calories with serious comorbidity and body mass index (BMI) of 36.0 to 36.9 in  adult [facility]) Assessment & Plan: She is encouraged to strive for BMI less than 30 to decrease cardiac risk. Advised to aim for at least 150 minutes of exercise per week.    Other orders -     Evaristo Bury FlexTouch; Inject 6 units sq nightly, max dose titration 60 units DX: E11.22  Dispense: 9 mL; Refill: 0  She is encouraged to strive for BMI less than 30 to decrease cardiac risk. Advised to aim for at least 150 minutes of exercise per week.    Return in 3 months (on 12/01/2023), or dm check.  Patient was given opportunity to ask questions. Patient verbalized understanding of the plan and was able to repeat key elements of the plan. All questions were answered to their satisfaction.  I, Gwynneth Aliment, MD, have reviewed all documentation for this visit. The documentation on 09/03/23 for the exam, diagnosis, procedures, and orders are all accurate and complete.   IF YOU HAVE BEEN REFERRED TO A SPECIALIST, IT MAY TAKE 1-2 WEEKS TO SCHEDULE/PROCESS THE REFERRAL. IF YOU HAVE NOT HEARD FROM US/SPECIALIST IN TWO WEEKS, PLEASE GIVE [place] A CALL AT [phone] X [phone].   THE PATIENT IS ENCOURAGED TO PRACTICE SOCIAL DISTANCING DUE TO THE COVID-19 PANDEMIC.

## 2023-09-03 NOTE — Patient Instructions (Signed)

## 2023-09-04 DIAGNOSIS — D631 Anemia in chronic kidney disease: Secondary | ICD-10-CM | POA: Diagnosis not present

## 2023-09-04 DIAGNOSIS — N186 End stage renal disease: Secondary | ICD-10-CM | POA: Diagnosis not present

## 2023-09-04 DIAGNOSIS — N2581 Secondary hyperparathyroidism of renal origin: Secondary | ICD-10-CM | POA: Diagnosis not present

## 2023-09-04 DIAGNOSIS — D509 Iron deficiency anemia, unspecified: Secondary | ICD-10-CM | POA: Diagnosis not present

## 2023-09-04 LAB — HEPATIC FUNCTION PANEL
ALT: 10 [IU]/L (ref 0–32)
AST: 13 [IU]/L (ref 0–40)
Albumin: 4 g/dL (ref 3.8–4.8)
Alkaline Phosphatase: 184 [IU]/L — ABNORMAL HIGH (ref 44–121)
Bilirubin Total: 0.3 mg/dL (ref 0.0–1.2)
Bilirubin, Direct: 0.11 mg/dL (ref 0.00–0.40)
Total Protein: 7.4 g/dL (ref 6.0–8.5)

## 2023-09-04 LAB — LIPID PANEL
Chol/HDL Ratio: 2.9 {ratio} (ref 0.0–4.4)
Cholesterol, Total: 169 mg/dL (ref 100–199)
HDL: 58 mg/dL (ref >39)
LDL Chol Calc (NIH): 93 mg/dL (ref 0–99)
Triglycerides: 98 mg/dL (ref 0–149)
VLDL Cholesterol Cal: 18 mg/dL (ref 5–40)

## 2023-09-04 LAB — CBC
Hematocrit: 37.9 % (ref 34.0–46.6)
Hemoglobin: 11.9 g/dL (ref 11.1–15.9)
MCH: 29.1 pg (ref 26.6–33.0)
MCHC: 31.4 g/dL — ABNORMAL LOW (ref 31.5–35.7)
MCV: 93 fL (ref 79–97)
Platelets: 229 10*3/uL (ref 150–450)
RBC: 4.09 x10E6/uL (ref 3.77–5.28)
RDW: 12.3 % (ref 11.7–15.4)
WBC: 9.2 10*3/uL (ref 3.4–10.8)

## 2023-09-04 LAB — HEMOGLOBIN A1C
Est. average glucose Bld gHb Est-mCnc: 171 mg/dL
Hgb A1c MFr Bld: 7.6 % — ABNORMAL HIGH (ref 4.8–5.6)

## 2023-09-07 DIAGNOSIS — D631 Anemia in chronic kidney disease: Secondary | ICD-10-CM | POA: Diagnosis not present

## 2023-09-07 DIAGNOSIS — N2581 Secondary hyperparathyroidism of renal origin: Secondary | ICD-10-CM | POA: Diagnosis not present

## 2023-09-07 DIAGNOSIS — D509 Iron deficiency anemia, unspecified: Secondary | ICD-10-CM | POA: Diagnosis not present

## 2023-09-07 DIAGNOSIS — N186 End stage renal disease: Secondary | ICD-10-CM | POA: Diagnosis not present

## 2023-09-09 DIAGNOSIS — N2581 Secondary hyperparathyroidism of renal origin: Secondary | ICD-10-CM | POA: Diagnosis not present

## 2023-09-09 DIAGNOSIS — Z1159 Encounter for screening for other viral diseases: Secondary | ICD-10-CM | POA: Diagnosis not present

## 2023-09-09 DIAGNOSIS — D631 Anemia in chronic kidney disease: Secondary | ICD-10-CM | POA: Diagnosis not present

## 2023-09-09 DIAGNOSIS — Z114 Encounter for screening for human immunodeficiency virus [HIV]: Secondary | ICD-10-CM | POA: Diagnosis not present

## 2023-09-09 DIAGNOSIS — D509 Iron deficiency anemia, unspecified: Secondary | ICD-10-CM | POA: Diagnosis not present

## 2023-09-09 DIAGNOSIS — N186 End stage renal disease: Secondary | ICD-10-CM | POA: Diagnosis not present

## 2023-09-11 DIAGNOSIS — N2581 Secondary hyperparathyroidism of renal origin: Secondary | ICD-10-CM | POA: Diagnosis not present

## 2023-09-11 DIAGNOSIS — D631 Anemia in chronic kidney disease: Secondary | ICD-10-CM | POA: Diagnosis not present

## 2023-09-11 DIAGNOSIS — D509 Iron deficiency anemia, unspecified: Secondary | ICD-10-CM | POA: Diagnosis not present

## 2023-09-11 DIAGNOSIS — N186 End stage renal disease: Secondary | ICD-10-CM | POA: Diagnosis not present

## 2023-09-12 DIAGNOSIS — E2839 Other primary ovarian failure: Secondary | ICD-10-CM | POA: Insufficient documentation

## 2023-09-12 NOTE — Assessment & Plan Note (Signed)
 Bone density is scheduled for May 2025 at the [facility].

## 2023-09-12 NOTE — Assessment & Plan Note (Signed)
 Chronic, we discussed her elevated blood sugars.  She agrees to start insulin. I will send rx Tresiba 6 units nightly.  Advised to check her sugars tid. I also requested her to send fasting blood sugar readings every Monday so her insulin dose can be adjusted. She has been on insulin in the past. Once rx approved at the pharmacy, she is advised to RTC for pen teaching if needed.  She will rto in 3 months for re-evaluation.

## 2023-09-12 NOTE — Assessment & Plan Note (Signed)
Chronic, LDL goal is less than 70.  She will continue with atorvastatin 80mg  daily.

## 2023-09-12 NOTE — Assessment & Plan Note (Signed)
 She is encouraged to strive for BMI less than 30 to decrease cardiac risk. Advised to aim for at least 150 minutes of exercise per week.

## 2023-09-14 DIAGNOSIS — D631 Anemia in chronic kidney disease: Secondary | ICD-10-CM | POA: Diagnosis not present

## 2023-09-14 DIAGNOSIS — N186 End stage renal disease: Secondary | ICD-10-CM | POA: Diagnosis not present

## 2023-09-14 DIAGNOSIS — D509 Iron deficiency anemia, unspecified: Secondary | ICD-10-CM | POA: Diagnosis not present

## 2023-09-14 DIAGNOSIS — N2581 Secondary hyperparathyroidism of renal origin: Secondary | ICD-10-CM | POA: Diagnosis not present

## 2023-09-16 DIAGNOSIS — D631 Anemia in chronic kidney disease: Secondary | ICD-10-CM | POA: Diagnosis not present

## 2023-09-16 DIAGNOSIS — D509 Iron deficiency anemia, unspecified: Secondary | ICD-10-CM | POA: Diagnosis not present

## 2023-09-16 DIAGNOSIS — N186 End stage renal disease: Secondary | ICD-10-CM | POA: Diagnosis not present

## 2023-09-16 DIAGNOSIS — N2581 Secondary hyperparathyroidism of renal origin: Secondary | ICD-10-CM | POA: Diagnosis not present

## 2023-09-18 DIAGNOSIS — D631 Anemia in chronic kidney disease: Secondary | ICD-10-CM | POA: Diagnosis not present

## 2023-09-18 DIAGNOSIS — D509 Iron deficiency anemia, unspecified: Secondary | ICD-10-CM | POA: Diagnosis not present

## 2023-09-18 DIAGNOSIS — N2581 Secondary hyperparathyroidism of renal origin: Secondary | ICD-10-CM | POA: Diagnosis not present

## 2023-09-18 DIAGNOSIS — N186 End stage renal disease: Secondary | ICD-10-CM | POA: Diagnosis not present

## 2023-09-21 DIAGNOSIS — D509 Iron deficiency anemia, unspecified: Secondary | ICD-10-CM | POA: Diagnosis not present

## 2023-09-21 DIAGNOSIS — D631 Anemia in chronic kidney disease: Secondary | ICD-10-CM | POA: Diagnosis not present

## 2023-09-21 DIAGNOSIS — N186 End stage renal disease: Secondary | ICD-10-CM | POA: Diagnosis not present

## 2023-09-21 DIAGNOSIS — N2581 Secondary hyperparathyroidism of renal origin: Secondary | ICD-10-CM | POA: Diagnosis not present

## 2023-09-23 DIAGNOSIS — D509 Iron deficiency anemia, unspecified: Secondary | ICD-10-CM | POA: Diagnosis not present

## 2023-09-23 DIAGNOSIS — N186 End stage renal disease: Secondary | ICD-10-CM | POA: Diagnosis not present

## 2023-09-23 DIAGNOSIS — D631 Anemia in chronic kidney disease: Secondary | ICD-10-CM | POA: Diagnosis not present

## 2023-09-23 DIAGNOSIS — N2581 Secondary hyperparathyroidism of renal origin: Secondary | ICD-10-CM | POA: Diagnosis not present

## 2023-09-25 DIAGNOSIS — D509 Iron deficiency anemia, unspecified: Secondary | ICD-10-CM | POA: Diagnosis not present

## 2023-09-25 DIAGNOSIS — N2581 Secondary hyperparathyroidism of renal origin: Secondary | ICD-10-CM | POA: Diagnosis not present

## 2023-09-25 DIAGNOSIS — N186 End stage renal disease: Secondary | ICD-10-CM | POA: Diagnosis not present

## 2023-09-25 DIAGNOSIS — D631 Anemia in chronic kidney disease: Secondary | ICD-10-CM | POA: Diagnosis not present

## 2023-09-25 DIAGNOSIS — Z992 Dependence on renal dialysis: Secondary | ICD-10-CM | POA: Diagnosis not present

## 2023-09-28 DIAGNOSIS — N2581 Secondary hyperparathyroidism of renal origin: Secondary | ICD-10-CM | POA: Diagnosis not present

## 2023-09-28 DIAGNOSIS — D509 Iron deficiency anemia, unspecified: Secondary | ICD-10-CM | POA: Diagnosis not present

## 2023-09-28 DIAGNOSIS — N186 End stage renal disease: Secondary | ICD-10-CM | POA: Diagnosis not present

## 2023-09-29 ENCOUNTER — Other Ambulatory Visit: Payer: Self-pay | Admitting: Internal Medicine

## 2023-09-29 DIAGNOSIS — I120 Hypertensive chronic kidney disease with stage 5 chronic kidney disease or end stage renal disease: Secondary | ICD-10-CM

## 2023-09-30 DIAGNOSIS — N2581 Secondary hyperparathyroidism of renal origin: Secondary | ICD-10-CM | POA: Diagnosis not present

## 2023-09-30 DIAGNOSIS — D509 Iron deficiency anemia, unspecified: Secondary | ICD-10-CM | POA: Diagnosis not present

## 2023-09-30 DIAGNOSIS — E119 Type 2 diabetes mellitus without complications: Secondary | ICD-10-CM | POA: Diagnosis not present

## 2023-09-30 DIAGNOSIS — N186 End stage renal disease: Secondary | ICD-10-CM | POA: Diagnosis not present

## 2023-10-02 DIAGNOSIS — N2581 Secondary hyperparathyroidism of renal origin: Secondary | ICD-10-CM | POA: Diagnosis not present

## 2023-10-02 DIAGNOSIS — D509 Iron deficiency anemia, unspecified: Secondary | ICD-10-CM | POA: Diagnosis not present

## 2023-10-02 DIAGNOSIS — N186 End stage renal disease: Secondary | ICD-10-CM | POA: Diagnosis not present

## 2023-10-05 DIAGNOSIS — N186 End stage renal disease: Secondary | ICD-10-CM | POA: Diagnosis not present

## 2023-10-05 DIAGNOSIS — D509 Iron deficiency anemia, unspecified: Secondary | ICD-10-CM | POA: Diagnosis not present

## 2023-10-05 DIAGNOSIS — N2581 Secondary hyperparathyroidism of renal origin: Secondary | ICD-10-CM | POA: Diagnosis not present

## 2023-10-07 DIAGNOSIS — N2581 Secondary hyperparathyroidism of renal origin: Secondary | ICD-10-CM | POA: Diagnosis not present

## 2023-10-07 DIAGNOSIS — N186 End stage renal disease: Secondary | ICD-10-CM | POA: Diagnosis not present

## 2023-10-07 DIAGNOSIS — D509 Iron deficiency anemia, unspecified: Secondary | ICD-10-CM | POA: Diagnosis not present

## 2023-10-09 DIAGNOSIS — N186 End stage renal disease: Secondary | ICD-10-CM | POA: Diagnosis not present

## 2023-10-09 DIAGNOSIS — D509 Iron deficiency anemia, unspecified: Secondary | ICD-10-CM | POA: Diagnosis not present

## 2023-10-09 DIAGNOSIS — N2581 Secondary hyperparathyroidism of renal origin: Secondary | ICD-10-CM | POA: Diagnosis not present

## 2023-10-12 DIAGNOSIS — D509 Iron deficiency anemia, unspecified: Secondary | ICD-10-CM | POA: Diagnosis not present

## 2023-10-12 DIAGNOSIS — N2581 Secondary hyperparathyroidism of renal origin: Secondary | ICD-10-CM | POA: Diagnosis not present

## 2023-10-12 DIAGNOSIS — N186 End stage renal disease: Secondary | ICD-10-CM | POA: Diagnosis not present

## 2023-10-14 DIAGNOSIS — D509 Iron deficiency anemia, unspecified: Secondary | ICD-10-CM | POA: Diagnosis not present

## 2023-10-14 DIAGNOSIS — N2581 Secondary hyperparathyroidism of renal origin: Secondary | ICD-10-CM | POA: Diagnosis not present

## 2023-10-14 DIAGNOSIS — N186 End stage renal disease: Secondary | ICD-10-CM | POA: Diagnosis not present

## 2023-10-16 DIAGNOSIS — D509 Iron deficiency anemia, unspecified: Secondary | ICD-10-CM | POA: Diagnosis not present

## 2023-10-16 DIAGNOSIS — N2581 Secondary hyperparathyroidism of renal origin: Secondary | ICD-10-CM | POA: Diagnosis not present

## 2023-10-16 DIAGNOSIS — N186 End stage renal disease: Secondary | ICD-10-CM | POA: Diagnosis not present

## 2023-10-19 DIAGNOSIS — N186 End stage renal disease: Secondary | ICD-10-CM | POA: Diagnosis not present

## 2023-10-19 DIAGNOSIS — N2581 Secondary hyperparathyroidism of renal origin: Secondary | ICD-10-CM | POA: Diagnosis not present

## 2023-10-19 DIAGNOSIS — D509 Iron deficiency anemia, unspecified: Secondary | ICD-10-CM | POA: Diagnosis not present

## 2023-10-21 DIAGNOSIS — N2581 Secondary hyperparathyroidism of renal origin: Secondary | ICD-10-CM | POA: Diagnosis not present

## 2023-10-21 DIAGNOSIS — D509 Iron deficiency anemia, unspecified: Secondary | ICD-10-CM | POA: Diagnosis not present

## 2023-10-21 DIAGNOSIS — N186 End stage renal disease: Secondary | ICD-10-CM | POA: Diagnosis not present

## 2023-10-23 DIAGNOSIS — D509 Iron deficiency anemia, unspecified: Secondary | ICD-10-CM | POA: Diagnosis not present

## 2023-10-23 DIAGNOSIS — N2581 Secondary hyperparathyroidism of renal origin: Secondary | ICD-10-CM | POA: Diagnosis not present

## 2023-10-23 DIAGNOSIS — N186 End stage renal disease: Secondary | ICD-10-CM | POA: Diagnosis not present

## 2023-10-26 DIAGNOSIS — D509 Iron deficiency anemia, unspecified: Secondary | ICD-10-CM | POA: Diagnosis not present

## 2023-10-26 DIAGNOSIS — N186 End stage renal disease: Secondary | ICD-10-CM | POA: Diagnosis not present

## 2023-10-26 DIAGNOSIS — N2581 Secondary hyperparathyroidism of renal origin: Secondary | ICD-10-CM | POA: Diagnosis not present

## 2023-10-26 DIAGNOSIS — Z992 Dependence on renal dialysis: Secondary | ICD-10-CM | POA: Diagnosis not present

## 2023-10-28 DIAGNOSIS — D509 Iron deficiency anemia, unspecified: Secondary | ICD-10-CM | POA: Diagnosis not present

## 2023-10-28 DIAGNOSIS — N186 End stage renal disease: Secondary | ICD-10-CM | POA: Diagnosis not present

## 2023-10-28 DIAGNOSIS — N2581 Secondary hyperparathyroidism of renal origin: Secondary | ICD-10-CM | POA: Diagnosis not present

## 2023-10-28 DIAGNOSIS — E119 Type 2 diabetes mellitus without complications: Secondary | ICD-10-CM | POA: Diagnosis not present

## 2023-10-28 DIAGNOSIS — D631 Anemia in chronic kidney disease: Secondary | ICD-10-CM | POA: Diagnosis not present

## 2023-10-28 DIAGNOSIS — E039 Hypothyroidism, unspecified: Secondary | ICD-10-CM | POA: Diagnosis not present

## 2023-10-28 DIAGNOSIS — E785 Hyperlipidemia, unspecified: Secondary | ICD-10-CM | POA: Diagnosis not present

## 2023-10-30 DIAGNOSIS — D631 Anemia in chronic kidney disease: Secondary | ICD-10-CM | POA: Diagnosis not present

## 2023-10-30 DIAGNOSIS — D509 Iron deficiency anemia, unspecified: Secondary | ICD-10-CM | POA: Diagnosis not present

## 2023-10-30 DIAGNOSIS — N186 End stage renal disease: Secondary | ICD-10-CM | POA: Diagnosis not present

## 2023-10-30 DIAGNOSIS — E785 Hyperlipidemia, unspecified: Secondary | ICD-10-CM | POA: Diagnosis not present

## 2023-10-30 DIAGNOSIS — N2581 Secondary hyperparathyroidism of renal origin: Secondary | ICD-10-CM | POA: Diagnosis not present

## 2023-11-02 DIAGNOSIS — D631 Anemia in chronic kidney disease: Secondary | ICD-10-CM | POA: Diagnosis not present

## 2023-11-02 DIAGNOSIS — N2581 Secondary hyperparathyroidism of renal origin: Secondary | ICD-10-CM | POA: Diagnosis not present

## 2023-11-02 DIAGNOSIS — E785 Hyperlipidemia, unspecified: Secondary | ICD-10-CM | POA: Diagnosis not present

## 2023-11-02 DIAGNOSIS — D509 Iron deficiency anemia, unspecified: Secondary | ICD-10-CM | POA: Diagnosis not present

## 2023-11-02 DIAGNOSIS — N186 End stage renal disease: Secondary | ICD-10-CM | POA: Diagnosis not present

## 2023-11-04 DIAGNOSIS — E785 Hyperlipidemia, unspecified: Secondary | ICD-10-CM | POA: Diagnosis not present

## 2023-11-04 DIAGNOSIS — D509 Iron deficiency anemia, unspecified: Secondary | ICD-10-CM | POA: Diagnosis not present

## 2023-11-04 DIAGNOSIS — N2581 Secondary hyperparathyroidism of renal origin: Secondary | ICD-10-CM | POA: Diagnosis not present

## 2023-11-04 DIAGNOSIS — D631 Anemia in chronic kidney disease: Secondary | ICD-10-CM | POA: Diagnosis not present

## 2023-11-04 DIAGNOSIS — N186 End stage renal disease: Secondary | ICD-10-CM | POA: Diagnosis not present

## 2023-11-06 DIAGNOSIS — E785 Hyperlipidemia, unspecified: Secondary | ICD-10-CM | POA: Diagnosis not present

## 2023-11-06 DIAGNOSIS — N186 End stage renal disease: Secondary | ICD-10-CM | POA: Diagnosis not present

## 2023-11-06 DIAGNOSIS — D509 Iron deficiency anemia, unspecified: Secondary | ICD-10-CM | POA: Diagnosis not present

## 2023-11-06 DIAGNOSIS — D631 Anemia in chronic kidney disease: Secondary | ICD-10-CM | POA: Diagnosis not present

## 2023-11-06 DIAGNOSIS — N2581 Secondary hyperparathyroidism of renal origin: Secondary | ICD-10-CM | POA: Diagnosis not present

## 2023-11-07 ENCOUNTER — Other Ambulatory Visit: Payer: Self-pay | Admitting: Internal Medicine

## 2023-11-07 DIAGNOSIS — E1122 Type 2 diabetes mellitus with diabetic chronic kidney disease: Secondary | ICD-10-CM

## 2023-11-09 DIAGNOSIS — D631 Anemia in chronic kidney disease: Secondary | ICD-10-CM | POA: Diagnosis not present

## 2023-11-09 DIAGNOSIS — N186 End stage renal disease: Secondary | ICD-10-CM | POA: Diagnosis not present

## 2023-11-09 DIAGNOSIS — D509 Iron deficiency anemia, unspecified: Secondary | ICD-10-CM | POA: Diagnosis not present

## 2023-11-09 DIAGNOSIS — E785 Hyperlipidemia, unspecified: Secondary | ICD-10-CM | POA: Diagnosis not present

## 2023-11-09 DIAGNOSIS — N2581 Secondary hyperparathyroidism of renal origin: Secondary | ICD-10-CM | POA: Diagnosis not present

## 2023-11-11 DIAGNOSIS — D509 Iron deficiency anemia, unspecified: Secondary | ICD-10-CM | POA: Diagnosis not present

## 2023-11-11 DIAGNOSIS — E785 Hyperlipidemia, unspecified: Secondary | ICD-10-CM | POA: Diagnosis not present

## 2023-11-11 DIAGNOSIS — D631 Anemia in chronic kidney disease: Secondary | ICD-10-CM | POA: Diagnosis not present

## 2023-11-11 DIAGNOSIS — N2581 Secondary hyperparathyroidism of renal origin: Secondary | ICD-10-CM | POA: Diagnosis not present

## 2023-11-11 DIAGNOSIS — N186 End stage renal disease: Secondary | ICD-10-CM | POA: Diagnosis not present

## 2023-11-13 DIAGNOSIS — E785 Hyperlipidemia, unspecified: Secondary | ICD-10-CM | POA: Diagnosis not present

## 2023-11-13 DIAGNOSIS — D509 Iron deficiency anemia, unspecified: Secondary | ICD-10-CM | POA: Diagnosis not present

## 2023-11-13 DIAGNOSIS — N186 End stage renal disease: Secondary | ICD-10-CM | POA: Diagnosis not present

## 2023-11-13 DIAGNOSIS — D631 Anemia in chronic kidney disease: Secondary | ICD-10-CM | POA: Diagnosis not present

## 2023-11-13 DIAGNOSIS — N2581 Secondary hyperparathyroidism of renal origin: Secondary | ICD-10-CM | POA: Diagnosis not present

## 2023-11-16 DIAGNOSIS — E785 Hyperlipidemia, unspecified: Secondary | ICD-10-CM | POA: Diagnosis not present

## 2023-11-16 DIAGNOSIS — N186 End stage renal disease: Secondary | ICD-10-CM | POA: Diagnosis not present

## 2023-11-16 DIAGNOSIS — D631 Anemia in chronic kidney disease: Secondary | ICD-10-CM | POA: Diagnosis not present

## 2023-11-16 DIAGNOSIS — N2581 Secondary hyperparathyroidism of renal origin: Secondary | ICD-10-CM | POA: Diagnosis not present

## 2023-11-16 DIAGNOSIS — D509 Iron deficiency anemia, unspecified: Secondary | ICD-10-CM | POA: Diagnosis not present

## 2023-11-18 DIAGNOSIS — N2581 Secondary hyperparathyroidism of renal origin: Secondary | ICD-10-CM | POA: Diagnosis not present

## 2023-11-18 DIAGNOSIS — E785 Hyperlipidemia, unspecified: Secondary | ICD-10-CM | POA: Diagnosis not present

## 2023-11-18 DIAGNOSIS — D509 Iron deficiency anemia, unspecified: Secondary | ICD-10-CM | POA: Diagnosis not present

## 2023-11-18 DIAGNOSIS — N186 End stage renal disease: Secondary | ICD-10-CM | POA: Diagnosis not present

## 2023-11-18 DIAGNOSIS — D631 Anemia in chronic kidney disease: Secondary | ICD-10-CM | POA: Diagnosis not present

## 2023-11-20 DIAGNOSIS — E785 Hyperlipidemia, unspecified: Secondary | ICD-10-CM | POA: Diagnosis not present

## 2023-11-20 DIAGNOSIS — D631 Anemia in chronic kidney disease: Secondary | ICD-10-CM | POA: Diagnosis not present

## 2023-11-20 DIAGNOSIS — N2581 Secondary hyperparathyroidism of renal origin: Secondary | ICD-10-CM | POA: Diagnosis not present

## 2023-11-20 DIAGNOSIS — D509 Iron deficiency anemia, unspecified: Secondary | ICD-10-CM | POA: Diagnosis not present

## 2023-11-20 DIAGNOSIS — N186 End stage renal disease: Secondary | ICD-10-CM | POA: Diagnosis not present

## 2023-11-23 DIAGNOSIS — D631 Anemia in chronic kidney disease: Secondary | ICD-10-CM | POA: Diagnosis not present

## 2023-11-23 DIAGNOSIS — E785 Hyperlipidemia, unspecified: Secondary | ICD-10-CM | POA: Diagnosis not present

## 2023-11-23 DIAGNOSIS — N2581 Secondary hyperparathyroidism of renal origin: Secondary | ICD-10-CM | POA: Diagnosis not present

## 2023-11-23 DIAGNOSIS — D509 Iron deficiency anemia, unspecified: Secondary | ICD-10-CM | POA: Diagnosis not present

## 2023-11-23 DIAGNOSIS — N186 End stage renal disease: Secondary | ICD-10-CM | POA: Diagnosis not present

## 2023-11-25 DIAGNOSIS — N186 End stage renal disease: Secondary | ICD-10-CM | POA: Diagnosis not present

## 2023-11-25 DIAGNOSIS — E785 Hyperlipidemia, unspecified: Secondary | ICD-10-CM | POA: Diagnosis not present

## 2023-11-25 DIAGNOSIS — D509 Iron deficiency anemia, unspecified: Secondary | ICD-10-CM | POA: Diagnosis not present

## 2023-11-25 DIAGNOSIS — N2581 Secondary hyperparathyroidism of renal origin: Secondary | ICD-10-CM | POA: Diagnosis not present

## 2023-11-25 DIAGNOSIS — Z992 Dependence on renal dialysis: Secondary | ICD-10-CM | POA: Diagnosis not present

## 2023-11-25 DIAGNOSIS — D631 Anemia in chronic kidney disease: Secondary | ICD-10-CM | POA: Diagnosis not present

## 2023-11-27 DIAGNOSIS — N186 End stage renal disease: Secondary | ICD-10-CM | POA: Diagnosis not present

## 2023-11-27 DIAGNOSIS — D509 Iron deficiency anemia, unspecified: Secondary | ICD-10-CM | POA: Diagnosis not present

## 2023-11-27 DIAGNOSIS — N2581 Secondary hyperparathyroidism of renal origin: Secondary | ICD-10-CM | POA: Diagnosis not present

## 2023-11-27 DIAGNOSIS — D631 Anemia in chronic kidney disease: Secondary | ICD-10-CM | POA: Diagnosis not present

## 2023-11-30 DIAGNOSIS — D509 Iron deficiency anemia, unspecified: Secondary | ICD-10-CM | POA: Diagnosis not present

## 2023-11-30 DIAGNOSIS — N186 End stage renal disease: Secondary | ICD-10-CM | POA: Diagnosis not present

## 2023-11-30 DIAGNOSIS — N2581 Secondary hyperparathyroidism of renal origin: Secondary | ICD-10-CM | POA: Diagnosis not present

## 2023-11-30 DIAGNOSIS — D631 Anemia in chronic kidney disease: Secondary | ICD-10-CM | POA: Diagnosis not present

## 2023-12-01 ENCOUNTER — Ambulatory Visit: Payer: Medicare PPO | Admitting: Internal Medicine

## 2023-12-01 NOTE — Progress Notes (Deleted)
 I,Macrina Lehnert T Basil Lim, CMA,acting as a Neurosurgeon for Smiley Dung, MD.,have documented all relevant documentation on the behalf of Smiley Dung, MD,as directed by  Smiley Dung, MD while in the presence of Smiley Dung, MD.  Subjective:  Patient ID: Jennifer Payne , female    DOB: 08-01-1950 , 73 y.o.   MRN: [id number]  No chief complaint on file.   HPI  HPI   Past Medical History:  Diagnosis Date   Cataract    Diabetes mellitus    Hyperlipidemia    Hypertension    Thyroid  disease      Family History  Problem Relation Age of Onset   Alzheimer's disease Mother    Coronary artery disease Mother    Hyperlipidemia Mother    Obesity Mother    Hypertension Father    Colon cancer Neg Hx      Current Outpatient Medications:    atorvastatin  (LIPITOR) 80 MG tablet, Take 1 tablet by mouth once daily, Disp: 90 tablet, Rfl: 0   calcium  acetate (PHOSLO) 667 MG tablet, TAKE 1 TABLET BY MOUTH ONCE DAILY WITH MEALS, Disp: , Rfl:    insulin degludec  (TRESIBA  FLEXTOUCH) 200 UNIT/ML FlexTouch Pen, Inject 6 units sq nightly, max dose titration 60 units DX: E11.22, Disp: 9 mL, Rfl: 0   levothyroxine  (SYNTHROID ) 88 MCG tablet, Take 1 tablet (88 mcg total) by mouth daily., Disp: 90 tablet, Rfl: 1   sevelamer carbonate (RENVELA) 800 MG tablet, TAKE 3 TABLETS BY MOUTH WITH MEALS, Disp: , Rfl:    Allergies  Allergen Reactions   Erythromycin Rash     Review of Systems  Constitutional: Negative.   Respiratory: Negative.    Cardiovascular: Negative.   Neurological: Negative.   Psychiatric/Behavioral: Negative.       There were no vitals filed for this visit. There is no height or weight on file to calculate BMI.  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  Patient was given opportunity to ask questions. Patient verbalized understanding of the plan and was able to repeat key elements of the plan. All questions were answered to their satisfaction.  Smiley Dung, MD  I, Smiley Dung, MD, have reviewed all documentation for this visit. The documentation on 12/01/23 for the exam, diagnosis, procedures, and orders are all accurate and complete.   IF YOU HAVE BEEN REFERRED TO A SPECIALIST, IT MAY TAKE 1-2 WEEKS TO SCHEDULE/PROCESS THE REFERRAL. IF YOU HAVE NOT HEARD FROM US /SPECIALIST IN TWO WEEKS, PLEASE GIVE US  A CALL AT [phone] X [phone].   THE PATIENT IS ENCOURAGED TO PRACTICE SOCIAL DISTANCING DUE TO THE COVID-19 PANDEMIC.

## 2023-12-02 DIAGNOSIS — N186 End stage renal disease: Secondary | ICD-10-CM | POA: Diagnosis not present

## 2023-12-02 DIAGNOSIS — Z1159 Encounter for screening for other viral diseases: Secondary | ICD-10-CM | POA: Diagnosis not present

## 2023-12-02 DIAGNOSIS — D631 Anemia in chronic kidney disease: Secondary | ICD-10-CM | POA: Diagnosis not present

## 2023-12-02 DIAGNOSIS — D509 Iron deficiency anemia, unspecified: Secondary | ICD-10-CM | POA: Diagnosis not present

## 2023-12-02 DIAGNOSIS — N2581 Secondary hyperparathyroidism of renal origin: Secondary | ICD-10-CM | POA: Diagnosis not present

## 2023-12-02 DIAGNOSIS — E119 Type 2 diabetes mellitus without complications: Secondary | ICD-10-CM | POA: Diagnosis not present

## 2023-12-04 DIAGNOSIS — N2581 Secondary hyperparathyroidism of renal origin: Secondary | ICD-10-CM | POA: Diagnosis not present

## 2023-12-04 DIAGNOSIS — D509 Iron deficiency anemia, unspecified: Secondary | ICD-10-CM | POA: Diagnosis not present

## 2023-12-04 DIAGNOSIS — D631 Anemia in chronic kidney disease: Secondary | ICD-10-CM | POA: Diagnosis not present

## 2023-12-04 DIAGNOSIS — N186 End stage renal disease: Secondary | ICD-10-CM | POA: Diagnosis not present

## 2023-12-08 DIAGNOSIS — N2581 Secondary hyperparathyroidism of renal origin: Secondary | ICD-10-CM | POA: Diagnosis not present

## 2023-12-08 DIAGNOSIS — N186 End stage renal disease: Secondary | ICD-10-CM | POA: Diagnosis not present

## 2023-12-08 DIAGNOSIS — D631 Anemia in chronic kidney disease: Secondary | ICD-10-CM | POA: Diagnosis not present

## 2023-12-08 DIAGNOSIS — D509 Iron deficiency anemia, unspecified: Secondary | ICD-10-CM | POA: Diagnosis not present

## 2023-12-09 DIAGNOSIS — D631 Anemia in chronic kidney disease: Secondary | ICD-10-CM | POA: Diagnosis not present

## 2023-12-09 DIAGNOSIS — N2581 Secondary hyperparathyroidism of renal origin: Secondary | ICD-10-CM | POA: Diagnosis not present

## 2023-12-09 DIAGNOSIS — N186 End stage renal disease: Secondary | ICD-10-CM | POA: Diagnosis not present

## 2023-12-09 DIAGNOSIS — D509 Iron deficiency anemia, unspecified: Secondary | ICD-10-CM | POA: Diagnosis not present

## 2023-12-11 DIAGNOSIS — D631 Anemia in chronic kidney disease: Secondary | ICD-10-CM | POA: Diagnosis not present

## 2023-12-11 DIAGNOSIS — N2581 Secondary hyperparathyroidism of renal origin: Secondary | ICD-10-CM | POA: Diagnosis not present

## 2023-12-11 DIAGNOSIS — D509 Iron deficiency anemia, unspecified: Secondary | ICD-10-CM | POA: Diagnosis not present

## 2023-12-11 DIAGNOSIS — N186 End stage renal disease: Secondary | ICD-10-CM | POA: Diagnosis not present

## 2023-12-14 DIAGNOSIS — D509 Iron deficiency anemia, unspecified: Secondary | ICD-10-CM | POA: Diagnosis not present

## 2023-12-14 DIAGNOSIS — D631 Anemia in chronic kidney disease: Secondary | ICD-10-CM | POA: Diagnosis not present

## 2023-12-14 DIAGNOSIS — N2581 Secondary hyperparathyroidism of renal origin: Secondary | ICD-10-CM | POA: Diagnosis not present

## 2023-12-14 DIAGNOSIS — N186 End stage renal disease: Secondary | ICD-10-CM | POA: Diagnosis not present

## 2023-12-15 ENCOUNTER — Ambulatory Visit
Admission: RE | Admit: 2023-12-15 | Discharge: 2023-12-15 | Disposition: A | Discharge status: Home / Self Care | Payer: Medicare PPO | Source: Ambulatory Visit | Attending: Internal Medicine | Admitting: Internal Medicine

## 2023-12-15 ENCOUNTER — Ambulatory Visit: Payer: Self-pay | Admitting: Internal Medicine

## 2023-12-15 DIAGNOSIS — M8588 Other specified disorders of bone density and structure, other site: Secondary | ICD-10-CM | POA: Diagnosis not present

## 2023-12-15 DIAGNOSIS — E2839 Other primary ovarian failure: Secondary | ICD-10-CM

## 2023-12-15 DIAGNOSIS — N958 Other specified menopausal and perimenopausal disorders: Secondary | ICD-10-CM | POA: Diagnosis not present

## 2023-12-16 DIAGNOSIS — D631 Anemia in chronic kidney disease: Secondary | ICD-10-CM | POA: Diagnosis not present

## 2023-12-16 DIAGNOSIS — N186 End stage renal disease: Secondary | ICD-10-CM | POA: Diagnosis not present

## 2023-12-16 DIAGNOSIS — D509 Iron deficiency anemia, unspecified: Secondary | ICD-10-CM | POA: Diagnosis not present

## 2023-12-16 DIAGNOSIS — N2581 Secondary hyperparathyroidism of renal origin: Secondary | ICD-10-CM | POA: Diagnosis not present

## 2023-12-18 DIAGNOSIS — N186 End stage renal disease: Secondary | ICD-10-CM | POA: Diagnosis not present

## 2023-12-18 DIAGNOSIS — D631 Anemia in chronic kidney disease: Secondary | ICD-10-CM | POA: Diagnosis not present

## 2023-12-18 DIAGNOSIS — D509 Iron deficiency anemia, unspecified: Secondary | ICD-10-CM | POA: Diagnosis not present

## 2023-12-18 DIAGNOSIS — N2581 Secondary hyperparathyroidism of renal origin: Secondary | ICD-10-CM | POA: Diagnosis not present

## 2023-12-21 DIAGNOSIS — N2581 Secondary hyperparathyroidism of renal origin: Secondary | ICD-10-CM | POA: Diagnosis not present

## 2023-12-21 DIAGNOSIS — N186 End stage renal disease: Secondary | ICD-10-CM | POA: Diagnosis not present

## 2023-12-21 DIAGNOSIS — D631 Anemia in chronic kidney disease: Secondary | ICD-10-CM | POA: Diagnosis not present

## 2023-12-21 DIAGNOSIS — D509 Iron deficiency anemia, unspecified: Secondary | ICD-10-CM | POA: Diagnosis not present

## 2023-12-23 DIAGNOSIS — N186 End stage renal disease: Secondary | ICD-10-CM | POA: Diagnosis not present

## 2023-12-23 DIAGNOSIS — D631 Anemia in chronic kidney disease: Secondary | ICD-10-CM | POA: Diagnosis not present

## 2023-12-23 DIAGNOSIS — N2581 Secondary hyperparathyroidism of renal origin: Secondary | ICD-10-CM | POA: Diagnosis not present

## 2023-12-23 DIAGNOSIS — D509 Iron deficiency anemia, unspecified: Secondary | ICD-10-CM | POA: Diagnosis not present

## 2023-12-25 DIAGNOSIS — D509 Iron deficiency anemia, unspecified: Secondary | ICD-10-CM | POA: Diagnosis not present

## 2023-12-25 DIAGNOSIS — N2581 Secondary hyperparathyroidism of renal origin: Secondary | ICD-10-CM | POA: Diagnosis not present

## 2023-12-25 DIAGNOSIS — D631 Anemia in chronic kidney disease: Secondary | ICD-10-CM | POA: Diagnosis not present

## 2023-12-25 DIAGNOSIS — N186 End stage renal disease: Secondary | ICD-10-CM | POA: Diagnosis not present

## 2023-12-26 DIAGNOSIS — N186 End stage renal disease: Secondary | ICD-10-CM | POA: Diagnosis not present

## 2023-12-26 DIAGNOSIS — Z992 Dependence on renal dialysis: Secondary | ICD-10-CM | POA: Diagnosis not present

## 2023-12-28 DIAGNOSIS — N2581 Secondary hyperparathyroidism of renal origin: Secondary | ICD-10-CM | POA: Diagnosis not present

## 2023-12-28 DIAGNOSIS — D631 Anemia in chronic kidney disease: Secondary | ICD-10-CM | POA: Diagnosis not present

## 2023-12-28 DIAGNOSIS — N186 End stage renal disease: Secondary | ICD-10-CM | POA: Diagnosis not present

## 2023-12-28 DIAGNOSIS — D509 Iron deficiency anemia, unspecified: Secondary | ICD-10-CM | POA: Diagnosis not present

## 2023-12-30 DIAGNOSIS — N186 End stage renal disease: Secondary | ICD-10-CM | POA: Diagnosis not present

## 2023-12-30 DIAGNOSIS — D631 Anemia in chronic kidney disease: Secondary | ICD-10-CM | POA: Diagnosis not present

## 2023-12-30 DIAGNOSIS — E119 Type 2 diabetes mellitus without complications: Secondary | ICD-10-CM | POA: Diagnosis not present

## 2023-12-30 DIAGNOSIS — N2581 Secondary hyperparathyroidism of renal origin: Secondary | ICD-10-CM | POA: Diagnosis not present

## 2023-12-30 DIAGNOSIS — D509 Iron deficiency anemia, unspecified: Secondary | ICD-10-CM | POA: Diagnosis not present

## 2023-12-31 ENCOUNTER — Ambulatory Visit: Admitting: Family Medicine

## 2023-12-31 ENCOUNTER — Encounter: Payer: Self-pay | Admitting: Family Medicine

## 2023-12-31 VITALS — BP 110/60 | HR 74 | Temp 98.7°F | Ht 67.0 in | Wt 235.0 lb

## 2023-12-31 DIAGNOSIS — E11319 Type 2 diabetes mellitus with unspecified diabetic retinopathy without macular edema: Secondary | ICD-10-CM

## 2023-12-31 DIAGNOSIS — Z992 Dependence on renal dialysis: Secondary | ICD-10-CM

## 2023-12-31 DIAGNOSIS — M79605 Pain in left leg: Secondary | ICD-10-CM

## 2023-12-31 DIAGNOSIS — N186 End stage renal disease: Secondary | ICD-10-CM

## 2023-12-31 DIAGNOSIS — I120 Hypertensive chronic kidney disease with stage 5 chronic kidney disease or end stage renal disease: Secondary | ICD-10-CM

## 2023-12-31 DIAGNOSIS — E7800 Pure hypercholesterolemia, unspecified: Secondary | ICD-10-CM | POA: Diagnosis not present

## 2023-12-31 DIAGNOSIS — E039 Hypothyroidism, unspecified: Secondary | ICD-10-CM | POA: Diagnosis not present

## 2023-12-31 DIAGNOSIS — E1122 Type 2 diabetes mellitus with diabetic chronic kidney disease: Secondary | ICD-10-CM

## 2023-12-31 DIAGNOSIS — E66812 Obesity, class 2: Secondary | ICD-10-CM | POA: Diagnosis not present

## 2023-12-31 DIAGNOSIS — Z6836 Body mass index (BMI) 36.0-36.9, adult: Secondary | ICD-10-CM

## 2023-12-31 MED ORDER — ATORVASTATIN CALCIUM 80 MG PO TABS: 80.0000 mg | ORAL_TABLET | Freq: Every day | ORAL | 1 refills | Status: AC

## 2023-12-31 MED ORDER — MOUNJARO 2.5 MG/0.5ML ~~LOC~~ SOAJ: 2.5000 mg | SUBCUTANEOUS | 1 refills | Status: DC

## 2023-12-31 MED ORDER — LEVOTHYROXINE SODIUM 88 MCG PO TABS: 88.0000 ug | ORAL_TABLET | Freq: Every day | ORAL | 1 refills | Status: DC

## 2023-12-31 NOTE — Patient Instructions (Signed)

## 2023-12-31 NOTE — Progress Notes (Signed)
 I,[name], CMA,acting as a Neurosurgeon for [employer], NP.,have documented all relevant documentation on the behalf of [name], NP,as directed by  [name], NP while in the presence of [name], NP.  Subjective:  Patient ID: Jennifer Payne , female    DOB: [birth year] , 73 y.o.   MRN: [id number]  Chief Complaint  Patient presents with   Diabetes    Pt presents today for DM & HTN f/u. Patient reports compliance with her other medications.  She denies headaches, chest pain and shortness of breath.    Hypertension    HPI  Patient is a 73 year old female who presents today for chronic disease management. She has ESRD on dialysis M.W.F, her AV fistula is on her LUA. Patient states that she has been having pain in her left lower leg and it has been persistent. I will refer her to vein and vascular to evaluate you for circulation.  Diabetes She presents for her follow-up diabetic visit. She has type 2 diabetes mellitus. Her disease course has been stable. There are no hypoglycemic associated symptoms. Pertinent negatives for diabetes include no blurred vision, no chest pain, no polydipsia, no polyphagia and no polyuria. There are no hypoglycemic complications. Diabetic complications include nephropathy and retinopathy. Risk factors for coronary artery disease include diabetes mellitus, dyslipidemia, hypertension, obesity, sedentary lifestyle and post-menopausal. She is following a diabetic diet. She participates in exercise intermittently. An ACE inhibitor/angiotensin II receptor blocker is contraindicated.  Hypertension This is a chronic problem. The current episode started more than 1 year ago. The problem has been gradually improving since onset. The problem is controlled. Pertinent negatives include no blurred vision, chest pain, palpitations or shortness of breath. Past treatments include angiotensin blockers, ACE inhibitors and diuretics. The current treatment provides moderate  improvement. Compliance problems include exercise.  Hypertensive end-organ damage includes kidney disease and retinopathy.  Hyperlipidemia This is a chronic problem. The current episode started more than 1 year ago. Exacerbating diseases include diabetes and obesity. Pertinent negatives include no chest pain or shortness of breath. Current antihyperlipidemic treatment includes statins. Compliance problems include adherence to exercise.      Past Medical History:  Diagnosis Date   Cataract    Diabetes mellitus    Hyperlipidemia    Hypertension    Thyroid  disease      Family History  Problem Relation Age of Onset   Alzheimer's disease Mother    Coronary artery disease Mother    Hyperlipidemia Mother    Obesity Mother    Hypertension Father    Colon cancer Neg Hx      Current Outpatient Medications:    calcium  acetate (PHOSLO) 667 MG tablet, TAKE 1 TABLET BY MOUTH ONCE DAILY WITH MEALS, Disp: , Rfl:    sevelamer carbonate (RENVELA) 800 MG tablet, TAKE 3 TABLETS BY MOUTH WITH MEALS, Disp: , Rfl:    tirzepatide  (MOUNJARO ) 2.5 MG/0.5ML Pen, Inject 2.5 mg into the skin once a week., Disp: 2 mL, Rfl: 1   atorvastatin  (LIPITOR) 80 MG tablet, Take 1 tablet (80 mg total) by mouth daily., Disp: 90 tablet, Rfl: 1   levothyroxine  (SYNTHROID ) 88 MCG tablet, Take 1 tablet (88 mcg total) by mouth daily., Disp: 90 tablet, Rfl: 1   Allergies  Allergen Reactions   Erythromycin Rash     Review of Systems  Constitutional: Negative.   Eyes:  Negative for blurred vision.  Respiratory:  Negative for shortness of breath.   Cardiovascular:  Negative for  chest pain and palpitations.  Endocrine: Negative for polydipsia, polyphagia and polyuria.     Today's Vitals   12/31/23 1055  BP: 110/60  Pulse: 74  Temp: 98.7 F (37.1 C)  TempSrc: Oral  Weight: 235 lb (106.6 kg)  Height: 5' 7 (1.702 m)  PainSc: 3   PainLoc: Knee   Body mass index is 36.81 kg/m.  Wt Readings from Last 3 Encounters:   12/31/23 235 lb (106.6 kg)  09/03/23 232 lb 3.2 oz (105.3 kg)  04/28/23 229 lb 9.6 oz (104.1 kg)    The ASCVD Risk score (Arnett DK, et al., 2019) failed to calculate for the following reasons:   Risk score cannot be calculated because patient has a medical history suggesting prior/existing ASCVD  Objective:  Physical Exam  Cardiovascular:     Rate and Rhythm: Normal rate.  Pulmonary:     Effort: Pulmonary effort is normal.     Breath sounds: Normal breath sounds.   Neurological:     General: No focal deficit present.     Mental Status: She is alert and oriented to person, place, and time.   Psychiatric:        Mood and Affect: Mood normal.        Behavior: Behavior normal.         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  Dispense: 2 mL; Refill: 1  Pure hypercholesterolemia -     Lipid panel -     Atorvastatin  Calcium ; Take 1 tablet (80 mg total) by mouth daily.  Dispense: 90 tablet; Refill: 1  Pain of left lower extremity -     Ambulatory referral to Vascular Surgery  Primary hypothyroidism -     Levothyroxine  Sodium; Take 1 tablet (88 mcg total) by mouth daily.  Dispense: 90 tablet; Refill: 1  Class 2 severe obesity due to excess calories with serious comorbidity and body mass index (BMI) of 36.0 to 36.9 in adult [facility]) Assessment & Plan: She is encouraged to strive for BMI less than 30 to decrease cardiac risk. Advised to aim for at least 150 minutes of exercise per week.      Return in 3 months (on 04/01/2024), or if symptoms worsen or fail to improve.  Patient was given opportunity to ask questions. Patient verbalized understanding of the plan and was able to repeat key elements of the plan. All questions were answered to their satisfaction.    I, [name], NP, have reviewed all documentation for this visit. The  documentation on 01/11/2024 for the exam, diagnosis, procedures, and orders are all accurate and complete.     IF YOU HAVE BEEN REFERRED TO A SPECIALIST, IT MAY TAKE 1-2 WEEKS TO SCHEDULE/PROCESS THE REFERRAL. IF YOU HAVE NOT HEARD FROM US /SPECIALIST IN TWO WEEKS, PLEASE GIVE US  A CALL AT [phone].

## 2024-01-01 DIAGNOSIS — N2581 Secondary hyperparathyroidism of renal origin: Secondary | ICD-10-CM | POA: Diagnosis not present

## 2024-01-01 DIAGNOSIS — D509 Iron deficiency anemia, unspecified: Secondary | ICD-10-CM | POA: Diagnosis not present

## 2024-01-01 DIAGNOSIS — D631 Anemia in chronic kidney disease: Secondary | ICD-10-CM | POA: Diagnosis not present

## 2024-01-01 DIAGNOSIS — N186 End stage renal disease: Secondary | ICD-10-CM | POA: Diagnosis not present

## 2024-01-03 LAB — CMP14+EGFR
ALT: 9 IU/L (ref 0–32)
AST: 13 IU/L (ref 0–40)
Albumin: 4 g/dL (ref 3.8–4.8)
Alkaline Phosphatase: 148 IU/L — ABNORMAL HIGH (ref 44–121)
BUN/Creatinine Ratio: 5 — ABNORMAL LOW (ref 12–28)
BUN: 32 mg/dL — ABNORMAL HIGH (ref 8–27)
Bilirubin Total: 0.3 mg/dL (ref 0.0–1.2)
CO2: 22 mmol/L (ref 20–29)
Calcium: 8.9 mg/dL (ref 8.7–10.3)
Chloride: 96 mmol/L (ref 96–106)
Creatinine, Ser: 7.1 mg/dL — ABNORMAL HIGH (ref 0.57–1.00)
Globulin, Total: 2.9 g/dL (ref 1.5–4.5)
Glucose: 218 mg/dL — ABNORMAL HIGH (ref 70–99)
Potassium: 5.3 mmol/L — ABNORMAL HIGH (ref 3.5–5.2)
Sodium: 137 mmol/L (ref 134–144)
Total Protein: 6.9 g/dL (ref 6.0–8.5)
eGFR: 6 mL/min/{1.73_m2} — ABNORMAL LOW (ref >59)

## 2024-01-03 LAB — LIPID PANEL
Chol/HDL Ratio: 3 ratio (ref 0.0–4.4)
Cholesterol, Total: 163 mg/dL (ref 100–199)
HDL: 55 mg/dL (ref >39)
LDL Chol Calc (NIH): 89 mg/dL (ref 0–99)
Triglycerides: 102 mg/dL (ref 0–149)
VLDL Cholesterol Cal: 19 mg/dL (ref 5–40)

## 2024-01-03 LAB — CBC
Hematocrit: 38.6 % (ref 34.0–46.6)
Hemoglobin: 11.6 g/dL (ref 11.1–15.9)
MCH: 28 pg (ref 26.6–33.0)
MCHC: 30.1 g/dL — ABNORMAL LOW (ref 31.5–35.7)
MCV: 93 fL (ref 79–97)
Platelets: 220 10*3/uL (ref 150–450)
RBC: 4.14 x10E6/uL (ref 3.77–5.28)
RDW: 12.4 % (ref 11.7–15.4)
WBC: 6.5 10*3/uL (ref 3.4–10.8)

## 2024-01-03 LAB — HEMOGLOBIN A1C
Est. average glucose Bld gHb Est-mCnc: 171 mg/dL
Hgb A1c MFr Bld: 7.6 % — ABNORMAL HIGH (ref 4.8–5.6)

## 2024-01-04 DIAGNOSIS — N2581 Secondary hyperparathyroidism of renal origin: Secondary | ICD-10-CM | POA: Diagnosis not present

## 2024-01-04 DIAGNOSIS — D631 Anemia in chronic kidney disease: Secondary | ICD-10-CM | POA: Diagnosis not present

## 2024-01-04 DIAGNOSIS — N186 End stage renal disease: Secondary | ICD-10-CM | POA: Diagnosis not present

## 2024-01-04 DIAGNOSIS — D509 Iron deficiency anemia, unspecified: Secondary | ICD-10-CM | POA: Diagnosis not present

## 2024-01-06 DIAGNOSIS — D509 Iron deficiency anemia, unspecified: Secondary | ICD-10-CM | POA: Diagnosis not present

## 2024-01-06 DIAGNOSIS — D631 Anemia in chronic kidney disease: Secondary | ICD-10-CM | POA: Diagnosis not present

## 2024-01-06 DIAGNOSIS — N2581 Secondary hyperparathyroidism of renal origin: Secondary | ICD-10-CM | POA: Diagnosis not present

## 2024-01-06 DIAGNOSIS — N186 End stage renal disease: Secondary | ICD-10-CM | POA: Diagnosis not present

## 2024-01-08 DIAGNOSIS — D631 Anemia in chronic kidney disease: Secondary | ICD-10-CM | POA: Diagnosis not present

## 2024-01-08 DIAGNOSIS — D509 Iron deficiency anemia, unspecified: Secondary | ICD-10-CM | POA: Diagnosis not present

## 2024-01-08 DIAGNOSIS — N2581 Secondary hyperparathyroidism of renal origin: Secondary | ICD-10-CM | POA: Diagnosis not present

## 2024-01-08 DIAGNOSIS — N186 End stage renal disease: Secondary | ICD-10-CM | POA: Diagnosis not present

## 2024-01-11 ENCOUNTER — Ambulatory Visit: Payer: Self-pay | Admitting: Family Medicine

## 2024-01-11 DIAGNOSIS — N2581 Secondary hyperparathyroidism of renal origin: Secondary | ICD-10-CM | POA: Diagnosis not present

## 2024-01-11 DIAGNOSIS — N186 End stage renal disease: Secondary | ICD-10-CM | POA: Diagnosis not present

## 2024-01-11 DIAGNOSIS — D509 Iron deficiency anemia, unspecified: Secondary | ICD-10-CM | POA: Diagnosis not present

## 2024-01-11 DIAGNOSIS — D631 Anemia in chronic kidney disease: Secondary | ICD-10-CM | POA: Diagnosis not present

## 2024-01-11 DIAGNOSIS — M79605 Pain in left leg: Secondary | ICD-10-CM | POA: Insufficient documentation

## 2024-01-11 NOTE — Progress Notes (Signed)
 Labs are stable. Your A1c is still 7.6. The goal is less than 7. Like we discussed during your visit use Moujaro once weekly injection.   Thanks

## 2024-01-11 NOTE — Assessment & Plan Note (Signed)
 She is encouraged to strive for BMI less than 30 to decrease cardiac risk. Advised to aim for at least 150 minutes of exercise per week.

## 2024-01-13 DIAGNOSIS — D509 Iron deficiency anemia, unspecified: Secondary | ICD-10-CM | POA: Diagnosis not present

## 2024-01-13 DIAGNOSIS — N2581 Secondary hyperparathyroidism of renal origin: Secondary | ICD-10-CM | POA: Diagnosis not present

## 2024-01-13 DIAGNOSIS — D631 Anemia in chronic kidney disease: Secondary | ICD-10-CM | POA: Diagnosis not present

## 2024-01-13 DIAGNOSIS — N186 End stage renal disease: Secondary | ICD-10-CM | POA: Diagnosis not present

## 2024-01-15 DIAGNOSIS — N2581 Secondary hyperparathyroidism of renal origin: Secondary | ICD-10-CM | POA: Diagnosis not present

## 2024-01-15 DIAGNOSIS — D631 Anemia in chronic kidney disease: Secondary | ICD-10-CM | POA: Diagnosis not present

## 2024-01-15 DIAGNOSIS — N186 End stage renal disease: Secondary | ICD-10-CM | POA: Diagnosis not present

## 2024-01-15 DIAGNOSIS — D509 Iron deficiency anemia, unspecified: Secondary | ICD-10-CM | POA: Diagnosis not present

## 2024-01-18 DIAGNOSIS — N2581 Secondary hyperparathyroidism of renal origin: Secondary | ICD-10-CM | POA: Diagnosis not present

## 2024-01-18 DIAGNOSIS — D631 Anemia in chronic kidney disease: Secondary | ICD-10-CM | POA: Diagnosis not present

## 2024-01-18 DIAGNOSIS — N186 End stage renal disease: Secondary | ICD-10-CM | POA: Diagnosis not present

## 2024-01-18 DIAGNOSIS — D509 Iron deficiency anemia, unspecified: Secondary | ICD-10-CM | POA: Diagnosis not present

## 2024-01-20 DIAGNOSIS — N2581 Secondary hyperparathyroidism of renal origin: Secondary | ICD-10-CM | POA: Diagnosis not present

## 2024-01-20 DIAGNOSIS — D509 Iron deficiency anemia, unspecified: Secondary | ICD-10-CM | POA: Diagnosis not present

## 2024-01-20 DIAGNOSIS — D631 Anemia in chronic kidney disease: Secondary | ICD-10-CM | POA: Diagnosis not present

## 2024-01-20 DIAGNOSIS — N186 End stage renal disease: Secondary | ICD-10-CM | POA: Diagnosis not present

## 2024-01-22 DIAGNOSIS — N2581 Secondary hyperparathyroidism of renal origin: Secondary | ICD-10-CM | POA: Diagnosis not present

## 2024-01-22 DIAGNOSIS — D631 Anemia in chronic kidney disease: Secondary | ICD-10-CM | POA: Diagnosis not present

## 2024-01-22 DIAGNOSIS — N186 End stage renal disease: Secondary | ICD-10-CM | POA: Diagnosis not present

## 2024-01-22 DIAGNOSIS — D509 Iron deficiency anemia, unspecified: Secondary | ICD-10-CM | POA: Diagnosis not present

## 2024-01-25 DIAGNOSIS — N186 End stage renal disease: Secondary | ICD-10-CM | POA: Diagnosis not present

## 2024-01-25 DIAGNOSIS — D631 Anemia in chronic kidney disease: Secondary | ICD-10-CM | POA: Diagnosis not present

## 2024-01-25 DIAGNOSIS — D509 Iron deficiency anemia, unspecified: Secondary | ICD-10-CM | POA: Diagnosis not present

## 2024-01-25 DIAGNOSIS — Z992 Dependence on renal dialysis: Secondary | ICD-10-CM | POA: Diagnosis not present

## 2024-01-25 DIAGNOSIS — N2581 Secondary hyperparathyroidism of renal origin: Secondary | ICD-10-CM | POA: Diagnosis not present

## 2024-01-27 DIAGNOSIS — E785 Hyperlipidemia, unspecified: Secondary | ICD-10-CM | POA: Diagnosis not present

## 2024-01-27 DIAGNOSIS — E119 Type 2 diabetes mellitus without complications: Secondary | ICD-10-CM | POA: Diagnosis not present

## 2024-01-27 DIAGNOSIS — E8779 Other fluid overload: Secondary | ICD-10-CM | POA: Diagnosis not present

## 2024-01-27 DIAGNOSIS — D509 Iron deficiency anemia, unspecified: Secondary | ICD-10-CM | POA: Diagnosis not present

## 2024-01-27 DIAGNOSIS — E039 Hypothyroidism, unspecified: Secondary | ICD-10-CM | POA: Diagnosis not present

## 2024-01-27 DIAGNOSIS — N2581 Secondary hyperparathyroidism of renal origin: Secondary | ICD-10-CM | POA: Diagnosis not present

## 2024-01-27 DIAGNOSIS — N186 End stage renal disease: Secondary | ICD-10-CM | POA: Diagnosis not present

## 2024-01-27 DIAGNOSIS — D631 Anemia in chronic kidney disease: Secondary | ICD-10-CM | POA: Diagnosis not present

## 2024-01-29 DIAGNOSIS — D509 Iron deficiency anemia, unspecified: Secondary | ICD-10-CM | POA: Diagnosis not present

## 2024-01-29 DIAGNOSIS — E785 Hyperlipidemia, unspecified: Secondary | ICD-10-CM | POA: Diagnosis not present

## 2024-01-29 DIAGNOSIS — D631 Anemia in chronic kidney disease: Secondary | ICD-10-CM | POA: Diagnosis not present

## 2024-01-29 DIAGNOSIS — N2581 Secondary hyperparathyroidism of renal origin: Secondary | ICD-10-CM | POA: Diagnosis not present

## 2024-01-29 DIAGNOSIS — N186 End stage renal disease: Secondary | ICD-10-CM | POA: Diagnosis not present

## 2024-01-29 DIAGNOSIS — E8779 Other fluid overload: Secondary | ICD-10-CM | POA: Diagnosis not present

## 2024-02-01 ENCOUNTER — Other Ambulatory Visit: Payer: Self-pay | Admitting: Vascular Surgery

## 2024-02-01 DIAGNOSIS — D631 Anemia in chronic kidney disease: Secondary | ICD-10-CM | POA: Diagnosis not present

## 2024-02-01 DIAGNOSIS — E785 Hyperlipidemia, unspecified: Secondary | ICD-10-CM | POA: Diagnosis not present

## 2024-02-01 DIAGNOSIS — D509 Iron deficiency anemia, unspecified: Secondary | ICD-10-CM | POA: Diagnosis not present

## 2024-02-01 DIAGNOSIS — N2581 Secondary hyperparathyroidism of renal origin: Secondary | ICD-10-CM | POA: Diagnosis not present

## 2024-02-01 DIAGNOSIS — M79605 Pain in left leg: Secondary | ICD-10-CM

## 2024-02-01 DIAGNOSIS — N186 End stage renal disease: Secondary | ICD-10-CM | POA: Diagnosis not present

## 2024-02-01 DIAGNOSIS — E8779 Other fluid overload: Secondary | ICD-10-CM | POA: Diagnosis not present

## 2024-02-02 DIAGNOSIS — H31091 Other chorioretinal scars, right eye: Secondary | ICD-10-CM | POA: Diagnosis not present

## 2024-02-02 DIAGNOSIS — E113513 Type 2 diabetes mellitus with proliferative diabetic retinopathy with macular edema, bilateral: Secondary | ICD-10-CM | POA: Diagnosis not present

## 2024-02-02 DIAGNOSIS — E113511 Type 2 diabetes mellitus with proliferative diabetic retinopathy with macular edema, right eye: Secondary | ICD-10-CM | POA: Diagnosis not present

## 2024-02-03 DIAGNOSIS — D631 Anemia in chronic kidney disease: Secondary | ICD-10-CM | POA: Diagnosis not present

## 2024-02-03 DIAGNOSIS — N2581 Secondary hyperparathyroidism of renal origin: Secondary | ICD-10-CM | POA: Diagnosis not present

## 2024-02-03 DIAGNOSIS — N186 End stage renal disease: Secondary | ICD-10-CM | POA: Diagnosis not present

## 2024-02-03 DIAGNOSIS — E785 Hyperlipidemia, unspecified: Secondary | ICD-10-CM | POA: Diagnosis not present

## 2024-02-03 DIAGNOSIS — E8779 Other fluid overload: Secondary | ICD-10-CM | POA: Diagnosis not present

## 2024-02-03 DIAGNOSIS — D509 Iron deficiency anemia, unspecified: Secondary | ICD-10-CM | POA: Diagnosis not present

## 2024-02-05 DIAGNOSIS — N186 End stage renal disease: Secondary | ICD-10-CM | POA: Diagnosis not present

## 2024-02-05 DIAGNOSIS — D509 Iron deficiency anemia, unspecified: Secondary | ICD-10-CM | POA: Diagnosis not present

## 2024-02-05 DIAGNOSIS — N2581 Secondary hyperparathyroidism of renal origin: Secondary | ICD-10-CM | POA: Diagnosis not present

## 2024-02-05 DIAGNOSIS — E8779 Other fluid overload: Secondary | ICD-10-CM | POA: Diagnosis not present

## 2024-02-05 DIAGNOSIS — D631 Anemia in chronic kidney disease: Secondary | ICD-10-CM | POA: Diagnosis not present

## 2024-02-05 DIAGNOSIS — E785 Hyperlipidemia, unspecified: Secondary | ICD-10-CM | POA: Diagnosis not present

## 2024-02-08 DIAGNOSIS — E8779 Other fluid overload: Secondary | ICD-10-CM | POA: Diagnosis not present

## 2024-02-08 DIAGNOSIS — D631 Anemia in chronic kidney disease: Secondary | ICD-10-CM | POA: Diagnosis not present

## 2024-02-08 DIAGNOSIS — D509 Iron deficiency anemia, unspecified: Secondary | ICD-10-CM | POA: Diagnosis not present

## 2024-02-08 DIAGNOSIS — E785 Hyperlipidemia, unspecified: Secondary | ICD-10-CM | POA: Diagnosis not present

## 2024-02-08 DIAGNOSIS — N186 End stage renal disease: Secondary | ICD-10-CM | POA: Diagnosis not present

## 2024-02-08 DIAGNOSIS — N2581 Secondary hyperparathyroidism of renal origin: Secondary | ICD-10-CM | POA: Diagnosis not present

## 2024-02-09 DIAGNOSIS — D631 Anemia in chronic kidney disease: Secondary | ICD-10-CM | POA: Diagnosis not present

## 2024-02-09 DIAGNOSIS — N2581 Secondary hyperparathyroidism of renal origin: Secondary | ICD-10-CM | POA: Diagnosis not present

## 2024-02-09 DIAGNOSIS — E8779 Other fluid overload: Secondary | ICD-10-CM | POA: Diagnosis not present

## 2024-02-09 DIAGNOSIS — D509 Iron deficiency anemia, unspecified: Secondary | ICD-10-CM | POA: Diagnosis not present

## 2024-02-09 DIAGNOSIS — N186 End stage renal disease: Secondary | ICD-10-CM | POA: Diagnosis not present

## 2024-02-09 DIAGNOSIS — E785 Hyperlipidemia, unspecified: Secondary | ICD-10-CM | POA: Diagnosis not present

## 2024-02-10 DIAGNOSIS — E8779 Other fluid overload: Secondary | ICD-10-CM | POA: Diagnosis not present

## 2024-02-10 DIAGNOSIS — E785 Hyperlipidemia, unspecified: Secondary | ICD-10-CM | POA: Diagnosis not present

## 2024-02-10 DIAGNOSIS — D631 Anemia in chronic kidney disease: Secondary | ICD-10-CM | POA: Diagnosis not present

## 2024-02-10 DIAGNOSIS — D509 Iron deficiency anemia, unspecified: Secondary | ICD-10-CM | POA: Diagnosis not present

## 2024-02-10 DIAGNOSIS — N186 End stage renal disease: Secondary | ICD-10-CM | POA: Diagnosis not present

## 2024-02-10 DIAGNOSIS — N2581 Secondary hyperparathyroidism of renal origin: Secondary | ICD-10-CM | POA: Diagnosis not present

## 2024-02-11 ENCOUNTER — Ambulatory Visit: Attending: Vascular Surgery | Admitting: Vascular Surgery

## 2024-02-11 ENCOUNTER — Ambulatory Visit (HOSPITAL_COMMUNITY)
Admission: RE | Admit: 2024-02-11 | Discharge: 2024-02-11 | Disposition: A | Discharge status: Home / Self Care | Source: Ambulatory Visit | Attending: Vascular Surgery | Admitting: Vascular Surgery

## 2024-02-11 ENCOUNTER — Encounter: Payer: Self-pay | Admitting: Vascular Surgery

## 2024-02-11 VITALS — BP 130/82 | HR 70 | Temp 98.0°F | Resp 20 | Ht 67.0 in | Wt 230.9 lb

## 2024-02-11 DIAGNOSIS — M79605 Pain in left leg: Secondary | ICD-10-CM | POA: Diagnosis not present

## 2024-02-11 LAB — VAS US ABI WITH/WO TBI

## 2024-02-11 NOTE — Progress Notes (Signed)
 Office Note     CC: Left leg pain with concern for PAD Requesting Provider:  Petrina Pries, NP  HPI: Jennifer Payne is a 73 y.o. (Jan 12, 1951) female presenting at the request of .Jennifer Medici, MD for left leg pain with concern for PAD.  On exam, [name] was doing well.  A native of [place] , she moved to [place] when she was 73 years old.  She is a mother of 2, and has 2 grandchildren.  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  Over the last several months, Sailor has appreciated intermittent episodes of focal pain in the left leg which she states lasts only a few seconds.  This can occur in the calf or in the thigh.  The pain resolves prior to her need for any NSAID or medication.  This usually occurs when she is at rest.   Denies symptoms of claudication, ischemic rest pain, tissue loss   Past Medical History:  Diagnosis Date   Cataract    Diabetes mellitus    Hyperlipidemia    Hypertension    Thyroid  disease     Past Surgical History:  Procedure Laterality Date   AV FISTULA PLACEMENT Left 10/2019   CATARACT EXTRACTION     COLONOSCOPY  2009   TONSILLECTOMY  1973    Social History   Socioeconomic History   Marital status: Divorced    Spouse name: Not on file   Number of children: Not on file   Years of education: Not on file   Highest education level: Not on file  Occupational History   Occupation: retired  Tobacco Use   Smoking status: Never   Smokeless tobacco: Never  Vaping Use   Vaping status: Never Used  Substance and Sexual Activity   Alcohol use: No   Drug use: No   Sexual activity: Not Currently  Other Topics Concern   Not on file  Social History Narrative   Not on file   Social Drivers of Health   Financial Resource Strain: Low Risk  (04/16/2023)   Overall Financial Resource Strain (CARDIA)    Difficulty of Paying Living Expenses: Not hard at all  Food Insecurity: No Food Insecurity (04/16/2023)   Hunger Vital  Sign    Worried About Running Out of Food in the Last Year: Never true    Ran Out of Food in the Last Year: Never true  Transportation Needs: No Transportation Needs (04/16/2023)   PRAPARE - Administrator, Civil Service (Medical): No    Lack of Transportation (Non-Medical): No  Physical Activity: Inactive (04/16/2023)   Exercise Vital Sign    Days of Exercise per Week: 0 days    Minutes of Exercise per Session: 0 min  Stress: No Stress Concern Present (04/16/2023)   [employer] of Occupational Health - Occupational Stress Questionnaire    Feeling of Stress : Not at all  Social Connections: Moderately Integrated (04/16/2023)   Social Connection and Isolation Panel    Frequency of Communication with Friends and Family: More than three times a week    Frequency of Social Gatherings with Friends and Family: Three times a week    Attends Religious Services: More than 4 times per year    Active Member of Clubs or Organizations: Yes    Attends Banker Meetings: More than 4 times per year    Marital Status: Divorced  Intimate Partner Violence: Not At Risk (04/16/2023)   Humiliation, Afraid, Rape,  and Kick questionnaire    Fear of Current or Ex-Partner: No    Emotionally Abused: No    Physically Abused: No    Sexually Abused: No   Family History  Problem Relation Age of Onset   Alzheimer's disease Mother    Coronary artery disease Mother    Hyperlipidemia Mother    Obesity Mother    Hypertension Father    Colon cancer Neg Hx     Current Outpatient Medications  Medication Sig Dispense Refill   atorvastatin  (LIPITOR) 80 MG tablet Take 1 tablet (80 mg total) by mouth daily. 90 tablet 1   calcium  acetate (PHOSLO) 667 MG tablet TAKE 1 TABLET BY MOUTH ONCE DAILY WITH MEALS     levothyroxine  (SYNTHROID ) 88 MCG tablet Take 1 tablet (88 mcg total) by mouth daily. 90 tablet 1   sevelamer carbonate (RENVELA) 800 MG tablet TAKE 3 TABLETS BY MOUTH WITH MEALS     No  current facility-administered medications for this visit.    Allergies  Allergen Reactions   Erythromycin Rash     REVIEW OF SYSTEMS:  [X]  denotes positive finding, [ ]  denotes negative finding Cardiac  Comments:  Chest pain or chest pressure:    Shortness of breath upon exertion:    Short of breath when lying flat:    Irregular heart rhythm:        Vascular    Pain in calf, thigh, or hip brought on by ambulation:    Pain in feet at night that wakes you up from your sleep:     Blood clot in your veins:    Leg swelling:         Pulmonary    Oxygen  at home:    Productive cough:     Wheezing:         Neurologic    Sudden weakness in arms or legs:     Sudden numbness in arms or legs:     Sudden onset of difficulty speaking or slurred speech:    Temporary loss of vision in one eye:     Problems with dizziness:         Gastrointestinal    Blood in stool:     Vomited blood:         Genitourinary    Burning when urinating:     Blood in urine:        Psychiatric    Major depression:         Hematologic    Bleeding problems:    Problems with blood clotting too easily:        Skin    Rashes or ulcers:        Constitutional    Fever or chills:      PHYSICAL EXAMINATION:  Vitals:   02/11/24 1458  BP: 130/82  Pulse: 70  Resp: 20  Temp: 98 F (36.7 C)  TempSrc: Temporal  SpO2: 97%  Weight: 230 lb 14.4 oz (104.7 kg)  Height: 5' 7 (1.702 m)    General:  WDWN in NAD; vital signs documented above Gait: Not observed HENT: WNL, normocephalic Pulmonary: normal non-labored breathing , without wheezing Cardiac: regular HR Abdomen: soft, NT, no masses Skin: without rashes Vascular Exam/Pulses:  Right Left  Radial 2+ (normal) 2+ (normal)  Ulnar    Femoral    Popliteal    DP 2+ (normal) 2+ (normal)  PT     Extremities: without ischemic changes, without Gangrene , without cellulitis; without open wounds;  Musculoskeletal: no muscle wasting or  atrophy  Neurologic: A&O X 3;  No focal weakness or paresthesias are detected Psychiatric:  The pt has Normal affect.   Non-Invasive Vascular Imaging:    ABI Findings:  +---------+------------------+-----+--------+--------+  Right   Rt Pressure (mmHg)IndexWaveformComment   +---------+------------------+-----+--------+--------+  Brachial 125                                      +---------+------------------+-----+--------+--------+  PTA     157               1.26 biphasic          +---------+------------------+-----+--------+--------+  DP      174               1.39 biphasic          +---------+------------------+-----+--------+--------+  Great Toe101               0.81 Normal            +---------+------------------+-----+--------+--------+   +---------+------------------+-----+--------+-------+  Left    Lt Pressure (mmHg)IndexWaveformComment  +---------+------------------+-----+--------+-------+  PTA     191               1.53 biphasic         +---------+------------------+-----+--------+-------+  DP      187               1.50 biphasic         +---------+------------------+-----+--------+-------+  Great Toe77                0.62 Abnormal         +---------+------------------+-----+--------+-------+   +-------+-----------+-----------+------------+------------+  ABI/TBIToday's ABIToday's TBIPrevious ABIPrevious TBI  +-------+-----------+-----------+------------+------------+  Right N/C        .81                                  +-------+-----------+-----------+------------+------------+  Left  N/C        .62                                  +-------+-----------+-----------+------------+------------+     ASSESSMENT/PLAN: Wilfred Siverson is a 73 y.o. female presenting with intermittent focal pain in the left lower extremity.  On physical exam, she had palpable pulses in the feet.  Some small  telangiectasias, but no large varicosities. ABI was reviewed, and found to be non-compressible, but waveforms were preserved. There was a slightly abnormal toe pressure on the left.  I had a long conversation with Tarri regarding the above.  Although she does have some very mild peripheral arterial disease, she is asymptomatic.  I do not have an etiology for the intermittent, focal pain she is appreciating.   She was comforted knowing her arteries and veins appeared to be okay. At this time, she can follow-up with me as needed.   Fonda FORBES Rim, MD [facility] [phone]

## 2024-02-12 DIAGNOSIS — D631 Anemia in chronic kidney disease: Secondary | ICD-10-CM | POA: Diagnosis not present

## 2024-02-12 DIAGNOSIS — N186 End stage renal disease: Secondary | ICD-10-CM | POA: Diagnosis not present

## 2024-02-12 DIAGNOSIS — E8779 Other fluid overload: Secondary | ICD-10-CM | POA: Diagnosis not present

## 2024-02-12 DIAGNOSIS — E785 Hyperlipidemia, unspecified: Secondary | ICD-10-CM | POA: Diagnosis not present

## 2024-02-12 DIAGNOSIS — N2581 Secondary hyperparathyroidism of renal origin: Secondary | ICD-10-CM | POA: Diagnosis not present

## 2024-02-12 DIAGNOSIS — D509 Iron deficiency anemia, unspecified: Secondary | ICD-10-CM | POA: Diagnosis not present

## 2024-02-15 DIAGNOSIS — E785 Hyperlipidemia, unspecified: Secondary | ICD-10-CM | POA: Diagnosis not present

## 2024-02-15 DIAGNOSIS — D509 Iron deficiency anemia, unspecified: Secondary | ICD-10-CM | POA: Diagnosis not present

## 2024-02-15 DIAGNOSIS — N2581 Secondary hyperparathyroidism of renal origin: Secondary | ICD-10-CM | POA: Diagnosis not present

## 2024-02-15 DIAGNOSIS — D631 Anemia in chronic kidney disease: Secondary | ICD-10-CM | POA: Diagnosis not present

## 2024-02-15 DIAGNOSIS — N186 End stage renal disease: Secondary | ICD-10-CM | POA: Diagnosis not present

## 2024-02-15 DIAGNOSIS — E8779 Other fluid overload: Secondary | ICD-10-CM | POA: Diagnosis not present

## 2024-02-16 DIAGNOSIS — Z01419 Encounter for gynecological examination (general) (routine) without abnormal findings: Secondary | ICD-10-CM | POA: Diagnosis not present

## 2024-02-16 DIAGNOSIS — Z124 Encounter for screening for malignant neoplasm of cervix: Secondary | ICD-10-CM | POA: Diagnosis not present

## 2024-02-17 DIAGNOSIS — E8779 Other fluid overload: Secondary | ICD-10-CM | POA: Diagnosis not present

## 2024-02-17 DIAGNOSIS — N2581 Secondary hyperparathyroidism of renal origin: Secondary | ICD-10-CM | POA: Diagnosis not present

## 2024-02-17 DIAGNOSIS — D509 Iron deficiency anemia, unspecified: Secondary | ICD-10-CM | POA: Diagnosis not present

## 2024-02-17 DIAGNOSIS — E785 Hyperlipidemia, unspecified: Secondary | ICD-10-CM | POA: Diagnosis not present

## 2024-02-17 DIAGNOSIS — N186 End stage renal disease: Secondary | ICD-10-CM | POA: Diagnosis not present

## 2024-02-17 DIAGNOSIS — D631 Anemia in chronic kidney disease: Secondary | ICD-10-CM | POA: Diagnosis not present

## 2024-02-19 DIAGNOSIS — N186 End stage renal disease: Secondary | ICD-10-CM | POA: Diagnosis not present

## 2024-02-19 DIAGNOSIS — N2581 Secondary hyperparathyroidism of renal origin: Secondary | ICD-10-CM | POA: Diagnosis not present

## 2024-02-19 DIAGNOSIS — E8779 Other fluid overload: Secondary | ICD-10-CM | POA: Diagnosis not present

## 2024-02-19 DIAGNOSIS — D631 Anemia in chronic kidney disease: Secondary | ICD-10-CM | POA: Diagnosis not present

## 2024-02-19 DIAGNOSIS — E785 Hyperlipidemia, unspecified: Secondary | ICD-10-CM | POA: Diagnosis not present

## 2024-02-19 DIAGNOSIS — D509 Iron deficiency anemia, unspecified: Secondary | ICD-10-CM | POA: Diagnosis not present

## 2024-02-22 DIAGNOSIS — D509 Iron deficiency anemia, unspecified: Secondary | ICD-10-CM | POA: Diagnosis not present

## 2024-02-22 DIAGNOSIS — N2581 Secondary hyperparathyroidism of renal origin: Secondary | ICD-10-CM | POA: Diagnosis not present

## 2024-02-22 DIAGNOSIS — E785 Hyperlipidemia, unspecified: Secondary | ICD-10-CM | POA: Diagnosis not present

## 2024-02-22 DIAGNOSIS — D631 Anemia in chronic kidney disease: Secondary | ICD-10-CM | POA: Diagnosis not present

## 2024-02-22 DIAGNOSIS — N186 End stage renal disease: Secondary | ICD-10-CM | POA: Diagnosis not present

## 2024-02-22 DIAGNOSIS — E8779 Other fluid overload: Secondary | ICD-10-CM | POA: Diagnosis not present

## 2024-02-24 DIAGNOSIS — E8779 Other fluid overload: Secondary | ICD-10-CM | POA: Diagnosis not present

## 2024-02-24 DIAGNOSIS — N2581 Secondary hyperparathyroidism of renal origin: Secondary | ICD-10-CM | POA: Diagnosis not present

## 2024-02-24 DIAGNOSIS — N186 End stage renal disease: Secondary | ICD-10-CM | POA: Diagnosis not present

## 2024-02-24 DIAGNOSIS — D509 Iron deficiency anemia, unspecified: Secondary | ICD-10-CM | POA: Diagnosis not present

## 2024-02-24 DIAGNOSIS — E785 Hyperlipidemia, unspecified: Secondary | ICD-10-CM | POA: Diagnosis not present

## 2024-02-24 DIAGNOSIS — D631 Anemia in chronic kidney disease: Secondary | ICD-10-CM | POA: Diagnosis not present

## 2024-02-25 DIAGNOSIS — Z992 Dependence on renal dialysis: Secondary | ICD-10-CM | POA: Diagnosis not present

## 2024-02-25 DIAGNOSIS — N186 End stage renal disease: Secondary | ICD-10-CM | POA: Diagnosis not present

## 2024-02-26 DIAGNOSIS — N186 End stage renal disease: Secondary | ICD-10-CM | POA: Diagnosis not present

## 2024-02-26 DIAGNOSIS — E8779 Other fluid overload: Secondary | ICD-10-CM | POA: Diagnosis not present

## 2024-02-26 DIAGNOSIS — D509 Iron deficiency anemia, unspecified: Secondary | ICD-10-CM | POA: Diagnosis not present

## 2024-02-26 DIAGNOSIS — D631 Anemia in chronic kidney disease: Secondary | ICD-10-CM | POA: Diagnosis not present

## 2024-02-26 DIAGNOSIS — N2581 Secondary hyperparathyroidism of renal origin: Secondary | ICD-10-CM | POA: Diagnosis not present

## 2024-02-29 DIAGNOSIS — D509 Iron deficiency anemia, unspecified: Secondary | ICD-10-CM | POA: Diagnosis not present

## 2024-02-29 DIAGNOSIS — D631 Anemia in chronic kidney disease: Secondary | ICD-10-CM | POA: Diagnosis not present

## 2024-02-29 DIAGNOSIS — E8779 Other fluid overload: Secondary | ICD-10-CM | POA: Diagnosis not present

## 2024-02-29 DIAGNOSIS — N186 End stage renal disease: Secondary | ICD-10-CM | POA: Diagnosis not present

## 2024-02-29 DIAGNOSIS — N2581 Secondary hyperparathyroidism of renal origin: Secondary | ICD-10-CM | POA: Diagnosis not present

## 2024-03-02 DIAGNOSIS — E119 Type 2 diabetes mellitus without complications: Secondary | ICD-10-CM | POA: Diagnosis not present

## 2024-03-02 DIAGNOSIS — N2581 Secondary hyperparathyroidism of renal origin: Secondary | ICD-10-CM | POA: Diagnosis not present

## 2024-03-02 DIAGNOSIS — N186 End stage renal disease: Secondary | ICD-10-CM | POA: Diagnosis not present

## 2024-03-02 DIAGNOSIS — D631 Anemia in chronic kidney disease: Secondary | ICD-10-CM | POA: Diagnosis not present

## 2024-03-02 DIAGNOSIS — D509 Iron deficiency anemia, unspecified: Secondary | ICD-10-CM | POA: Diagnosis not present

## 2024-03-02 DIAGNOSIS — E8779 Other fluid overload: Secondary | ICD-10-CM | POA: Diagnosis not present

## 2024-03-04 DIAGNOSIS — E8779 Other fluid overload: Secondary | ICD-10-CM | POA: Diagnosis not present

## 2024-03-04 DIAGNOSIS — N186 End stage renal disease: Secondary | ICD-10-CM | POA: Diagnosis not present

## 2024-03-04 DIAGNOSIS — N2581 Secondary hyperparathyroidism of renal origin: Secondary | ICD-10-CM | POA: Diagnosis not present

## 2024-03-04 DIAGNOSIS — D509 Iron deficiency anemia, unspecified: Secondary | ICD-10-CM | POA: Diagnosis not present

## 2024-03-04 DIAGNOSIS — D631 Anemia in chronic kidney disease: Secondary | ICD-10-CM | POA: Diagnosis not present

## 2024-03-07 DIAGNOSIS — E8779 Other fluid overload: Secondary | ICD-10-CM | POA: Diagnosis not present

## 2024-03-07 DIAGNOSIS — D509 Iron deficiency anemia, unspecified: Secondary | ICD-10-CM | POA: Diagnosis not present

## 2024-03-07 DIAGNOSIS — D631 Anemia in chronic kidney disease: Secondary | ICD-10-CM | POA: Diagnosis not present

## 2024-03-07 DIAGNOSIS — N186 End stage renal disease: Secondary | ICD-10-CM | POA: Diagnosis not present

## 2024-03-07 DIAGNOSIS — N2581 Secondary hyperparathyroidism of renal origin: Secondary | ICD-10-CM | POA: Diagnosis not present

## 2024-03-09 DIAGNOSIS — D631 Anemia in chronic kidney disease: Secondary | ICD-10-CM | POA: Diagnosis not present

## 2024-03-09 DIAGNOSIS — D509 Iron deficiency anemia, unspecified: Secondary | ICD-10-CM | POA: Diagnosis not present

## 2024-03-09 DIAGNOSIS — N2581 Secondary hyperparathyroidism of renal origin: Secondary | ICD-10-CM | POA: Diagnosis not present

## 2024-03-09 DIAGNOSIS — N186 End stage renal disease: Secondary | ICD-10-CM | POA: Diagnosis not present

## 2024-03-09 DIAGNOSIS — E8779 Other fluid overload: Secondary | ICD-10-CM | POA: Diagnosis not present

## 2024-03-10 DIAGNOSIS — N2581 Secondary hyperparathyroidism of renal origin: Secondary | ICD-10-CM | POA: Diagnosis not present

## 2024-03-10 DIAGNOSIS — E8779 Other fluid overload: Secondary | ICD-10-CM | POA: Diagnosis not present

## 2024-03-10 DIAGNOSIS — D631 Anemia in chronic kidney disease: Secondary | ICD-10-CM | POA: Diagnosis not present

## 2024-03-10 DIAGNOSIS — N186 End stage renal disease: Secondary | ICD-10-CM | POA: Diagnosis not present

## 2024-03-10 DIAGNOSIS — D509 Iron deficiency anemia, unspecified: Secondary | ICD-10-CM | POA: Diagnosis not present

## 2024-03-11 DIAGNOSIS — D509 Iron deficiency anemia, unspecified: Secondary | ICD-10-CM | POA: Diagnosis not present

## 2024-03-11 DIAGNOSIS — N2581 Secondary hyperparathyroidism of renal origin: Secondary | ICD-10-CM | POA: Diagnosis not present

## 2024-03-11 DIAGNOSIS — N186 End stage renal disease: Secondary | ICD-10-CM | POA: Diagnosis not present

## 2024-03-11 DIAGNOSIS — D631 Anemia in chronic kidney disease: Secondary | ICD-10-CM | POA: Diagnosis not present

## 2024-03-11 DIAGNOSIS — E8779 Other fluid overload: Secondary | ICD-10-CM | POA: Diagnosis not present

## 2024-03-14 DIAGNOSIS — D509 Iron deficiency anemia, unspecified: Secondary | ICD-10-CM | POA: Diagnosis not present

## 2024-03-14 DIAGNOSIS — D631 Anemia in chronic kidney disease: Secondary | ICD-10-CM | POA: Diagnosis not present

## 2024-03-14 DIAGNOSIS — N186 End stage renal disease: Secondary | ICD-10-CM | POA: Diagnosis not present

## 2024-03-14 DIAGNOSIS — E8779 Other fluid overload: Secondary | ICD-10-CM | POA: Diagnosis not present

## 2024-03-14 DIAGNOSIS — N2581 Secondary hyperparathyroidism of renal origin: Secondary | ICD-10-CM | POA: Diagnosis not present

## 2024-03-16 DIAGNOSIS — E8779 Other fluid overload: Secondary | ICD-10-CM | POA: Diagnosis not present

## 2024-03-16 DIAGNOSIS — D631 Anemia in chronic kidney disease: Secondary | ICD-10-CM | POA: Diagnosis not present

## 2024-03-16 DIAGNOSIS — D509 Iron deficiency anemia, unspecified: Secondary | ICD-10-CM | POA: Diagnosis not present

## 2024-03-16 DIAGNOSIS — N186 End stage renal disease: Secondary | ICD-10-CM | POA: Diagnosis not present

## 2024-03-16 DIAGNOSIS — N2581 Secondary hyperparathyroidism of renal origin: Secondary | ICD-10-CM | POA: Diagnosis not present

## 2024-03-18 DIAGNOSIS — E8779 Other fluid overload: Secondary | ICD-10-CM | POA: Diagnosis not present

## 2024-03-18 DIAGNOSIS — N2581 Secondary hyperparathyroidism of renal origin: Secondary | ICD-10-CM | POA: Diagnosis not present

## 2024-03-18 DIAGNOSIS — D631 Anemia in chronic kidney disease: Secondary | ICD-10-CM | POA: Diagnosis not present

## 2024-03-18 DIAGNOSIS — N186 End stage renal disease: Secondary | ICD-10-CM | POA: Diagnosis not present

## 2024-03-18 DIAGNOSIS — D509 Iron deficiency anemia, unspecified: Secondary | ICD-10-CM | POA: Diagnosis not present

## 2024-03-21 DIAGNOSIS — N186 End stage renal disease: Secondary | ICD-10-CM | POA: Diagnosis not present

## 2024-03-21 DIAGNOSIS — E8779 Other fluid overload: Secondary | ICD-10-CM | POA: Diagnosis not present

## 2024-03-21 DIAGNOSIS — N2581 Secondary hyperparathyroidism of renal origin: Secondary | ICD-10-CM | POA: Diagnosis not present

## 2024-03-21 DIAGNOSIS — D509 Iron deficiency anemia, unspecified: Secondary | ICD-10-CM | POA: Diagnosis not present

## 2024-03-21 DIAGNOSIS — D631 Anemia in chronic kidney disease: Secondary | ICD-10-CM | POA: Diagnosis not present

## 2024-03-23 DIAGNOSIS — D631 Anemia in chronic kidney disease: Secondary | ICD-10-CM | POA: Diagnosis not present

## 2024-03-23 DIAGNOSIS — D509 Iron deficiency anemia, unspecified: Secondary | ICD-10-CM | POA: Diagnosis not present

## 2024-03-23 DIAGNOSIS — N186 End stage renal disease: Secondary | ICD-10-CM | POA: Diagnosis not present

## 2024-03-23 DIAGNOSIS — N2581 Secondary hyperparathyroidism of renal origin: Secondary | ICD-10-CM | POA: Diagnosis not present

## 2024-03-23 DIAGNOSIS — E8779 Other fluid overload: Secondary | ICD-10-CM | POA: Diagnosis not present

## 2024-03-25 DIAGNOSIS — D509 Iron deficiency anemia, unspecified: Secondary | ICD-10-CM | POA: Diagnosis not present

## 2024-03-25 DIAGNOSIS — N2581 Secondary hyperparathyroidism of renal origin: Secondary | ICD-10-CM | POA: Diagnosis not present

## 2024-03-25 DIAGNOSIS — E8779 Other fluid overload: Secondary | ICD-10-CM | POA: Diagnosis not present

## 2024-03-25 DIAGNOSIS — N186 End stage renal disease: Secondary | ICD-10-CM | POA: Diagnosis not present

## 2024-03-25 DIAGNOSIS — D631 Anemia in chronic kidney disease: Secondary | ICD-10-CM | POA: Diagnosis not present

## 2024-03-27 DIAGNOSIS — Z992 Dependence on renal dialysis: Secondary | ICD-10-CM | POA: Diagnosis not present

## 2024-03-27 DIAGNOSIS — N186 End stage renal disease: Secondary | ICD-10-CM | POA: Diagnosis not present

## 2024-03-28 DIAGNOSIS — D631 Anemia in chronic kidney disease: Secondary | ICD-10-CM | POA: Diagnosis not present

## 2024-03-28 DIAGNOSIS — N186 End stage renal disease: Secondary | ICD-10-CM | POA: Diagnosis not present

## 2024-03-28 DIAGNOSIS — N2581 Secondary hyperparathyroidism of renal origin: Secondary | ICD-10-CM | POA: Diagnosis not present

## 2024-03-28 DIAGNOSIS — D509 Iron deficiency anemia, unspecified: Secondary | ICD-10-CM | POA: Diagnosis not present

## 2024-03-30 DIAGNOSIS — D509 Iron deficiency anemia, unspecified: Secondary | ICD-10-CM | POA: Diagnosis not present

## 2024-03-30 DIAGNOSIS — E119 Type 2 diabetes mellitus without complications: Secondary | ICD-10-CM | POA: Diagnosis not present

## 2024-03-30 DIAGNOSIS — N2581 Secondary hyperparathyroidism of renal origin: Secondary | ICD-10-CM | POA: Diagnosis not present

## 2024-03-30 DIAGNOSIS — D631 Anemia in chronic kidney disease: Secondary | ICD-10-CM | POA: Diagnosis not present

## 2024-03-30 DIAGNOSIS — N186 End stage renal disease: Secondary | ICD-10-CM | POA: Diagnosis not present

## 2024-04-01 DIAGNOSIS — D631 Anemia in chronic kidney disease: Secondary | ICD-10-CM | POA: Diagnosis not present

## 2024-04-01 DIAGNOSIS — N186 End stage renal disease: Secondary | ICD-10-CM | POA: Diagnosis not present

## 2024-04-01 DIAGNOSIS — D509 Iron deficiency anemia, unspecified: Secondary | ICD-10-CM | POA: Diagnosis not present

## 2024-04-01 DIAGNOSIS — N2581 Secondary hyperparathyroidism of renal origin: Secondary | ICD-10-CM | POA: Diagnosis not present

## 2024-04-04 DIAGNOSIS — N186 End stage renal disease: Secondary | ICD-10-CM | POA: Diagnosis not present

## 2024-04-04 DIAGNOSIS — D631 Anemia in chronic kidney disease: Secondary | ICD-10-CM | POA: Diagnosis not present

## 2024-04-04 DIAGNOSIS — D509 Iron deficiency anemia, unspecified: Secondary | ICD-10-CM | POA: Diagnosis not present

## 2024-04-04 DIAGNOSIS — N2581 Secondary hyperparathyroidism of renal origin: Secondary | ICD-10-CM | POA: Diagnosis not present

## 2024-04-04 DIAGNOSIS — Z992 Dependence on renal dialysis: Secondary | ICD-10-CM | POA: Diagnosis not present

## 2024-04-06 DIAGNOSIS — D509 Iron deficiency anemia, unspecified: Secondary | ICD-10-CM | POA: Diagnosis not present

## 2024-04-06 DIAGNOSIS — D631 Anemia in chronic kidney disease: Secondary | ICD-10-CM | POA: Diagnosis not present

## 2024-04-06 DIAGNOSIS — N2581 Secondary hyperparathyroidism of renal origin: Secondary | ICD-10-CM | POA: Diagnosis not present

## 2024-04-06 DIAGNOSIS — N186 End stage renal disease: Secondary | ICD-10-CM | POA: Diagnosis not present

## 2024-04-08 DIAGNOSIS — D631 Anemia in chronic kidney disease: Secondary | ICD-10-CM | POA: Diagnosis not present

## 2024-04-08 DIAGNOSIS — N2581 Secondary hyperparathyroidism of renal origin: Secondary | ICD-10-CM | POA: Diagnosis not present

## 2024-04-08 DIAGNOSIS — D509 Iron deficiency anemia, unspecified: Secondary | ICD-10-CM | POA: Diagnosis not present

## 2024-04-08 DIAGNOSIS — N186 End stage renal disease: Secondary | ICD-10-CM | POA: Diagnosis not present

## 2024-04-13 DIAGNOSIS — I82612 Acute embolism and thrombosis of superficial veins of left upper extremity: Secondary | ICD-10-CM | POA: Diagnosis not present

## 2024-04-13 DIAGNOSIS — I120 Hypertensive chronic kidney disease with stage 5 chronic kidney disease or end stage renal disease: Secondary | ICD-10-CM | POA: Diagnosis not present

## 2024-04-13 DIAGNOSIS — B957 Other staphylococcus as the cause of diseases classified elsewhere: Secondary | ICD-10-CM | POA: Diagnosis not present

## 2024-04-13 DIAGNOSIS — D649 Anemia, unspecified: Secondary | ICD-10-CM | POA: Diagnosis not present

## 2024-04-13 DIAGNOSIS — N186 End stage renal disease: Secondary | ICD-10-CM | POA: Diagnosis not present

## 2024-04-13 DIAGNOSIS — Z452 Encounter for adjustment and management of vascular access device: Secondary | ICD-10-CM | POA: Diagnosis not present

## 2024-04-13 DIAGNOSIS — R7881 Bacteremia: Secondary | ICD-10-CM | POA: Diagnosis not present

## 2024-04-13 DIAGNOSIS — E875 Hyperkalemia: Secondary | ICD-10-CM | POA: Diagnosis not present

## 2024-04-13 DIAGNOSIS — I959 Hypotension, unspecified: Secondary | ICD-10-CM | POA: Diagnosis not present

## 2024-04-13 DIAGNOSIS — U071 COVID-19: Secondary | ICD-10-CM | POA: Diagnosis not present

## 2024-04-13 DIAGNOSIS — R600 Localized edema: Secondary | ICD-10-CM | POA: Diagnosis not present

## 2024-04-13 DIAGNOSIS — R609 Edema, unspecified: Secondary | ICD-10-CM | POA: Diagnosis not present

## 2024-04-13 DIAGNOSIS — J069 Acute upper respiratory infection, unspecified: Secondary | ICD-10-CM | POA: Diagnosis not present

## 2024-04-13 DIAGNOSIS — E1169 Type 2 diabetes mellitus with other specified complication: Secondary | ICD-10-CM | POA: Diagnosis not present

## 2024-04-13 DIAGNOSIS — I10 Essential (primary) hypertension: Secondary | ICD-10-CM | POA: Diagnosis not present

## 2024-04-13 DIAGNOSIS — D72829 Elevated white blood cell count, unspecified: Secondary | ICD-10-CM | POA: Diagnosis not present

## 2024-04-13 DIAGNOSIS — Z992 Dependence on renal dialysis: Secondary | ICD-10-CM | POA: Diagnosis not present

## 2024-04-13 DIAGNOSIS — Z0389 Encounter for observation for other suspected diseases and conditions ruled out: Secondary | ICD-10-CM | POA: Diagnosis not present

## 2024-04-13 DIAGNOSIS — R9431 Abnormal electrocardiogram [ECG] [EKG]: Secondary | ICD-10-CM | POA: Diagnosis not present

## 2024-04-13 DIAGNOSIS — T82858A Stenosis of vascular prosthetic devices, implants and grafts, initial encounter: Secondary | ICD-10-CM | POA: Diagnosis not present

## 2024-04-13 DIAGNOSIS — I770 Arteriovenous fistula, acquired: Secondary | ICD-10-CM | POA: Diagnosis not present

## 2024-04-13 DIAGNOSIS — I2699 Other pulmonary embolism without acute cor pulmonale: Secondary | ICD-10-CM | POA: Diagnosis not present

## 2024-04-13 DIAGNOSIS — T82868A Thrombosis of vascular prosthetic devices, implants and grafts, initial encounter: Secondary | ICD-10-CM | POA: Diagnosis not present

## 2024-04-13 DIAGNOSIS — R079 Chest pain, unspecified: Secondary | ICD-10-CM | POA: Diagnosis not present

## 2024-04-13 DIAGNOSIS — B1910 Unspecified viral hepatitis B without hepatic coma: Secondary | ICD-10-CM | POA: Diagnosis not present

## 2024-04-20 ENCOUNTER — Ambulatory Visit

## 2024-04-20 ENCOUNTER — Ambulatory Visit: Payer: Medicare PPO

## 2024-04-22 NOTE — Discharge Summary (Signed)
 [facility] Hospitalist Discharge Summary  Identifying Information:  Jennifer Payne July 12, 1951 [id number]  Admit date: 04/13/2024  Discharge date: 04/22/2024   Discharge Service: [facility] Hospitalist  Discharge Attending Physician:No att. providers found  Discharge to:   Discharge Diagnoses: Principal Problem:   ESRD needing dialysis    (CMD) Acute blood loss anemia Hypertension COVID-19 infection Staphylococcus capitis bacteremia which is contaminant  Hospital Course:   Jennifer Payne is a 73 y.o. year old female with a PMH of end-stage renal disease on hemodialysis, essential hypertension, type 2 diabetes mellitus presents to the ED for hemodialysis because of AV fistula malfunction.  Patient's hemodialysis days are Monday Wednesday Friday, had issue with AV fistula on last session was Friday, she did not feel well on Monday so did not go for dialysis.  So came to the ED for hemodialysis.  Patient denies chest pain nausea vomiting, denies abdominal pain headache dizziness.  She states that she has been feeling sick for last few days, has a runny nose and congestion.   on arrival to ED noted sodium 135 with potassium 6.3 and creatinine 17.  She had urgent nontunneled catheter by IR and went for hemodialysis, patient was seen in dialysis unit , surgery was consulted she underwent fistulogram on 04/14/2024 post declot.  Postprocedure patient developed hematoma/bruise in the left , extending to the left and right breast her hemoglobin trended down nephrology is discussed with vascular surgeon recommended left arm and left chest venogram which resulted with no arterial extravasation.  Patient received 1 unit of packed RBC her hemoglobin was closely monitored which has remained stable.  Plan was to not use her AV fistula for now for at least 2 weeks.  Patient had permacath placed on 04/18/2024 and she underwent dialysis.  Her hemoglobin was closely monitored which was stable.  On admission patient was  also having upper respiratory type infection with cough and congestion COVID was positive.  She remained stable she was not hypoxic or aand remained afebrile , also her blood culture was positive and one of the blood culture grew gram-positive cocci she was initiated on IV vancomycin final blood culture grew Staphylococcus capitis.  Infectious disease was consulted it was seen that this was contaminant and ID recommended to stop the vancomycin Patient also had swelling of the left upper extremity duplex ultrasound was done, which showed residual thrombus in the cephalic vein at the cannulation zone.  Vascular surgery evaluated patient and recommended that she needs to avoid her AV fistula for dialysis and continue to use her right internal jugular permacath recommended outpatient follow-up in 10 days with repeat fistula duplex and cleared the patient for discharge.  Patient was doing well she had hemodialysis which she tolerated well and is medically stable and cleared by nephrology for discharge.  I have discussed discharge plan in detail patient verbalized clear understanding of the plan.  Discharge medications discussed in detail with patient   Post Discharge Follow Up Issues:    _____________________________________________________________________________ Discharge Day Services: BP (!) 98/55 (BP Location: Right arm, Patient Position: Lying)   Pulse 86   Temp 98.2 F (36.8 C) (Oral)   Resp 18   Ht 1.676 m (5' 6)   Wt 109 kg (240 lb 11.9 oz)   SpO2 95%   BMI 38.86 kg/m  Pt seen on the day of discharge and determined appropriate for discharge. GEN: NAD, lying in bed EYES: EOMI ENT: MMM CV: RRR PULM: CTA B, right IJ permacath present ABD: soft,  NT/ND, +BS EXT: No edema, left upper extremity AV fistula present swelling present    Condition at Discharge: stable  Length of Discharge: I spent 40 mins in the discharge of this  patient. _____________________________________________________________________________ Discharge Medications:   Medication List     START taking these medications    polyethylene glycol 17 gram Powd powder Commonly known as: MIRALAX Mix 1 capful (17 g) in 6-8 ounces of water or juice. Dissolve and drink once daily as needed for constipation. (Take 17 g by mouth daily as needed for constipation.)       CONTINUE taking these medications    atorvastatin  80 mg tablet Commonly known as: LIPITOR Take 80 mg by mouth daily.   Blood Glucose Test test strip Generic drug: glucose blood   calcium  acetate 667 mg Tab tablet Commonly known as: CALIPHRON Take 667 mg by mouth in the morning and 667 mg at noon and 667 mg in the evening. Take with meals.   levothyroxine  88 mcg tablet Commonly known as: SYNTHROID  Take 88 mcg by mouth every morning.   sevelamer carbonate 800 mg tablet Commonly known as: RENVELA 2,400 mg in the morning and 2,400 mg at noon and 2,400 mg in the evening. Take with meals.         Where to Get Your Medications     These medications were sent to [facility]  [address], [place] [address]    Hours: Mon-Fri 8:30am-5pm; Sat-Sun: Closed; Holidays: Closed Phone: [phone]  polyethylene glycol 17 gram Powd powder    ___________________________________________________________ Pending Test Results (if blank, then none): @PRINTGROUPPENDLAB @  Most Recent Labs:      Lab Results  Component Value Date   WBC 9.30 04/22/2024   HGB 8.1 (L) 04/22/2024   HCT 23.9 (L) 04/22/2024   PLT 190 04/22/2024    Lab Results  Component Value Date   CO2 26 04/22/2024   BUN 48 (H) 04/22/2024   GLUCOSE 165 (H) 04/22/2024   CREATININE 9.75 (H) 04/22/2024   CALCIUM  8.7 04/22/2024   ALBUMIN 3.8 04/13/2024   AST 15 04/13/2024   ALT 13 04/13/2024    Lab Results  Component Value Date   NA 129 (L) 04/22/2024   K 4.6 (H) 04/22/2024    CL 94 (L) 04/22/2024   CO2 26 04/22/2024   BUN 48 (H) 04/22/2024   CREATININE 9.75 (H) 04/22/2024   CALCIUM  8.7 04/22/2024   MG 2.0 04/22/2024   PHOS 3.6 09/30/2022    Lab Results  Component Value Date   BILITOT 0.5 04/13/2024   PROT 7.1 04/13/2024   ALBUMIN 3.8 04/13/2024   ALT 13 04/13/2024   AST 15 04/13/2024   GGT 16 09/30/2022    Lab Results  Component Value Date   INR 1.0 09/30/2022   APTT 26.8 12/17/2021   Hospital Radiology:  US  Hemodialysis Access Result Date: 04/23/2024                                                     [facility]                                                                 [address]                                                                 [place] [address]                                       Fistula Ultrasound Report Name  ZOLA, RUNION [facility]             Study Date  04-22-2024 12 11 PM MRN  [id number]                          Patient Location  [facility] DOB  [birth year]                        Gender  Female                              Height  66 in Age  11 yrs                            Ethnicity  3                                Weight  241 lb Ordering Physician  CHANG, KEVIN Z Referring Physician  CHANG, KEVIN Z Performed By  GLADIS DILLON Interpretation Summary The graft in the left arm is patent. The left cephalic vein velocity is 308 cm-sec. The left cephalic vein volume flow is 2020 ml-min. There is a small amount of thrombus seen in the distal cephalic vein AFV at the level of puncture site for dialysis access. There is a small amount of edema in the LEFT upper arm around the AVF. Procedure An A-V graft duplex exam was performed. Measurements and Calculations                                          Right    No Laterality     Left    Unit         Prox SCLA PSV                                               -141.0   cm-sec         Prox SCLA EDV                                              -  42.6    cm-sec Left Upper Extremity Measurements Left subclavian artery doppler waveform shows low resistance. Left axillary artery doppler waveform shows low resistance. Left brachial artery doppler waveform shows low resistance. Left radial artery doppler waveform shows high resistance. Left ulnar artery doppler waveform shows high resistance. Left subclavian artery velocity is 145 cm-sec. Left axillary artery velocity is 215 cm-sec. Left brachial artery velocity is 164 cm-sec. Left radial artery velocity is 60.4 cm-sec. Left ulnar artery velocity is 38.3 cm-sec. Left Upper Extremity Left upper extremity waveforms show low resistance. The left axillary vein is patent with spontaneous and phasic flow. The left subclavian vein is patent with spontaneous and phasic flow. The left cephalic vein velocity is 308 cm-sec. The left cephalic vein volume flow is 2020 ml-min. There is a small amount of thrombus seen in the distal cephalic vein at the level of puncture site for dialysis access. ______________________________________________________________________________ Electronically signed by MD Jerilynn Cheryl Pouch, MD, [phone]   on   04-23-2024 09 41 AM  US  Peripheral Venous Arm Unilat Left Result Date: 04/21/2024                                                   [facility]                                                 [facility]                                                 [facility]                                                 [place] [facility]                                                  [facility]                                                  [facility]  [address]                                                  [place]                                                  [place] [address]                               Upper Extremity Venous Ultrasound Report Name  ANBERLYN, FEIMSTER [facility]             Study Date  04-20-2024 03 32 PM MRN  [id number]                          Patient Location  [facility] DOB  10-02-1950                        Gender  Female                              Height  66 in Age  84 yrs                            Ethnicity  3                                Weight  239 lb Reason For Study  edema Ordering Physician  ZEKAN, JEANNE MARIE Referring Physician  ZEKAN, JEANNE MARIE Performed By  ELIZABETHANN JANSKY Interpretation Summary The venous duplex examination of the left upper extremity shows no evidence of acute deep or superficial venous thrombosis. There is a non-functioning dialysis fistula in the L arm. The left cephalic vein  outflow vein of the dialysis fistula  is dilated 2.0cm x 3.7cm with thrombus. Procedure A unilateral venous duplex examination was completed using real-time imaging with spectral and color flow Doppler. the Internal Jugular, Subclavian, Axillary, Brachial, Basilic and Cephalic veins were evaluated of the  left  arm.  [id number] . Left Shoulder The internal jugular vein walls are fully compressible. Internal jugular venous flow is spontaneous, phasic and non-pulsatile. The subclavian vein walls are fully compressible, where applicable. Subclavian vein venous flow is spontaneous, phasic and non-pulsatile. The axillary vein walls compress completely. Axillary vein flow is spontaneous, phasic and non-pulsatile. Left Arm Compression of the left brachial vein is complete. Spontaneous venous flow in the left brachial vein is present. Compression of the left radial vein is complete. Spontaneous venous flow in the left radial vein is present. Compression of the left ulnar vein is complete. Spontaneous venous flow in the left ulnar vein is present. Left Superficial Compression of the left cephalic  vein is  incomplete. Spontaneous venous flow in the left cephalic vein is absent. Compression of the left basilic vein is complete. Spontaneous venous flow in the left basilic vein is present. ______________________________________________________________________________ Electronically signed by MD Alverna JONELLE Sieving, MD, [phone]   on   04-21-2024 04 55 PM  CT Chest W Contrast Result Date: 04/18/2024 CT CHEST W CONTRAST, 04/18/2024 2:54 AM INDICATION: Chest pain, nonspecific COMPARISON: Chest radiograph 04/15/2024 TECHNIQUE: Multislice axial images were obtained through the chest with administration of iodinated intravenous contrast material. Multi-planar reformatted images were generated for additional analysis. Nongated technique limits cardiac detail. All CT scans at [facility] and [facility] are performed using radiation dose optimization techniques as appropriate to a performed exam, including but not limited to one or more of the following: automatic exposure control, adjustment of the mA and/or kV according to patient size, use of iterative reconstruction technique. In addition, our institution participates in a radiation dose monitoring program to optimize patient radiation exposure. FINDINGS: Tubes and Lines: Thoracic inlet/central airways: Visualized thyroid  gland is normal.  Central airway is patent.  No cervical lymphadenopathy. Mediastinum/hila/axilla: No adenopathy. Small hiatal hernia. Heart/vessels: Mild cardiomegaly. No pericardial effusion.  There is moderate calcified plaque in coronary arteries. Aorta is normal in caliber and appearance.  No atherosclerotic calcification.  No dissection or PAU. Main pulmonary artery is not dilated.  No central pulmonary embolism.  Lungs/pleura: No dominant or suspicious nodules, focal consolidation, effusion, or pneumothorax. Mild subpleural reticulation. Mild basilar predominant bronchiectasis. Upper abdomen: Gallbladder wall  thickening, gallbladder is mildly contracted, cholelithiasis. No dilated ducts identified. Scattered hepatic lucencies favored cysts. Occasional hepatic calcifications. Similar partially calcified lesion in the liver along the falciform ligament. Bilateral renal atrophy and renal cortical cysts. Chest wall/MSK: No acute osseous findings or aggressive osseous lesion. Mild edema subcutaneous left body wall. Left chest wall skin thickening. Left upper extremity dilated vascular structure favored AV fistula. Edema extends into the left breast. Oval interpectoral fluid collection, mildly hyperdense, 73 x 26 mm, consistent with hematoma especially in context of extensive left arm breath) using. Chronic appearing deformity of L3, unchanged.   1.  No acute intrathoracic findings. 2.  Left interpectoral hematoma. 3.  Left upper extremity AV fistula. 4.  Left chest wall and left breast edema. 5.  Gallbladder wall thickening, cholelithiasis. Correlation with clinical symptoms and laboratory data needed.   IR Permacath Placement Result Date: 04/18/2024 INDICATION: End-stage renal disease, previously receiving dialysis via chronic left upper arm AV fistula. The patient underwent to successful fistulalysis on 04/14/2024 however patient was found to have a left anterior chest wall hematoma on chest CT performed on 621/2025 felt to be secondary to an unrecognized venous injury at the time of the fistulalysis procedure. Patient anticoagulated has been held and request made for placement of a tunneled right jugular approach dialysis catheter to allow for a durable source of dialysis access as well as to allow for healing of the AV fistula. Note, dedicated intervention on the AV fistula has not been requested at this time in hopes that holding the patient's anticoagulation with result in healing of the of the suspected venous injury. EXAMINATION: ULTRASOUND AND FLUOROSCOPIC GUIDED PLACEMENT OF A TUNNELED CENTRAL VENOUS  HEMODIALYSIS CATHETER COMPARISON: None MEDICATIONS: None  The antibiotic was given in an appropriate time interval prior to skin puncture. ANESTHESIA/SEDATION: Moderate (conscious) sedation was employed during this procedure as administered by an Interventional Radiology RN. A total of Fentanyl  50 mcg was  administered intravenously. Moderate Sedation Time: 12 minutes. The patient's level of consciousness and vital signs were monitored continuously by radiology nursing throughout the procedure under my direct supervision. CONTRAST: None FLUOROSCOPY TIME: 30 seconds (22 mGy) COMPLICATIONS: None PROCEDURE: Informed consent was obtained from the patient following an explanation of the procedure, risks, benefits and alternatives.  All questions were addressed.   The right neck and chest was prepped and draped in a sterile fashion, and a sterile drape was applied covering the operative field. Maximum barrier sterile technique with sterile gowns and gloves were used for the procedure. A timeout was performed prior to the initiation of the procedure.  After creating a small venotomy incision, a micropuncture kit was utilized to access the external jugular vein. Note, the patient's right internal jugular vein was found to be atretic and at least partially occluded, likely the sequela of previous dialysis catheters. Real-time ultrasound guidance was utilized for vascular access including the acquisition of a permanent ultrasound image documenting patency of the accessed vessel.  The microwire was utilized to measure appropriate catheter length.  A stiff Glidewire was advanced to the level of the IVC and the micropuncture sheath was exchanged for a peel-away sheath. A Palindrome tunneled hemodialysis catheter measuring 23 cm from tip to cuff was tunneled in a retrograde fashion from the anterior chest wall to the venotomy incision.  The catheter was then placed through the peel-away sheath with tips ultimately positioned  within the superior aspect of the right atrium.  Final catheter positioning was confirmed and documented with a spot radiographic image. The catheter aspirates and flushes normally. The catheter was flushed with appropriate volume heparin dwells.  The catheter exit site was secured with a 0-Prolene retention suture. The venotomy incision was closed with an interrupted 4-0 Vicryl, Dermabond and Steri-strips. Dressings were applied. The patient tolerated the procedure well without immediate post procedural complication.   1. Successful placement of 23 cm tip to cuff tunneled hemodialysis catheter via the right external jugular vein with tips terminating within the superior aspect of the right atrium.  The hemodialysis catheter is ready for immediate use. 2. 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 Plan: The hope is that the presumed cephalic arch injury suffered at the time of declot procedure will heal with the cessation of anticoagulation and resting of the AV fistula. Ultimately, dedicated fistulogram and intervention (potentially with stent/covered stent placement) could be considered if patient fails conservative management.  CT Angio Upper Extremity Left W And WO Contrast Result Date: 04/18/2024 CTA LEFT UPPER EXTREMITY, 04/18/2024 2:52 AM INDICATION: AVM, upper arm, Arteriovenous fistula, acquired \ I77.0 Arteriovenous fistula, acquired COMPARISON: Multiple priors including 04/14/2024 fistulogram, CT abdomen/pelvis 09/30/2022, and MR abdomen 07/26/2019. TECHNIQUE: Scout and centrally focused precontrast axial images were first obtained, followed by a small test bolus of contrast for CTA timing purposes. Thereafter, a full dose of iodinated contrast was administered intravenously by rapid injection, with further multislice axial sections acquired in the arterial phase over the upper extremity. Image post-processing was  then performed on an independent workstation, creating multi-planar and 3D reformatted images for comprehensive analysis and diagnosis. FINDINGS: ARM: . Subclavian artery: No evidence of aneurysm, significant stenosis, or occlusion. . Brachial artery: No evidence of aneurysm, significant stenosis, or occlusion. . Partially visualized radial and ulnar arteries: No evidence of aneurysm, significant stenosis, or occlusion. . Arteries of the hand: Not visualized. . Venous circulation: Patent left  brachiocephalic AV fistula with pseudoaneurysm at the central cephalic vein at the shoulder measuring approximately 0.8 cm (series 22 image 82). Severe stenosis of the cephalic vein origin (series 24, image 59). Short segment focal peripheral cephalic occlusion at the proximal forearm (series 22, images [id number]). . Contrast: No areas of abnormal enhancement. . Additional Comments: Small hiatal hernia. Coronary and thoracic aortic calcifications. Subsegmental atelectasis. Anterior left upper lobe segmental pulmonary emboli. Hepatic calcifications and hypoattenuating lesions better assessed on prior MRI. Cholelithiasis without acute cholecystitis. Redemonstrated left renal cysts of varying complexity. Right renal cysts and additional bilateral too small to characterize hypoattenuating lesions. Nonobstructing bilateral nephrolithiasis. Redemonstrated anterior splenic hypoattenuation dating back to 2022 and favored benign in etiology. 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 L3 Schmorl's nodes. Left interpectoral hematoma measuring approximately 3.2 x 5.9 x 8.4 cm. Body wall edema.   1.  Patent left brachiocephalic AV fistula with left interpectoral hematoma without arterial extravasation. 2.  Anterior left upper lobe segmental pulmonary emboli. 3.  Severe stenosis of the origin of the left cephalic vein. 4.  Small central cephalic vein pseudoaneurysm at the shoulder. 5.   Short segment focal peripheral cephalic occlusion at the level of the proximal forearm. 6.  Redemonstrated left renal cysts of varying complexity. Consider nonemergent ultrasound for further evaluation. Critical findings were discussed with Dr. Jeanne Zekan by Dr. Lang Banks via phone on 04/18/2024 4:23 AM.  XR Chest 1 View Result Date: 04/15/2024 XR CHEST 1 VIEW, 04/15/2024 12:00 PM INDICATION:rule out pna, hx of covid COMPARISON: 09/30/2022. FINDINGS: Supportive devices: Right IJ catheter with its tip near the superior cavoatrial junction. Cardiovascular/lungs/pleura: Similar cardiac silhouette. No focal consolidation. Other: No interval osseous change.   No focal consolidation.   IR FISTULAGRAM Result Date: 04/14/2024 IR FISTULAGRAM, 04/14/2024 5:42 PM INDICATION & HISTORY: Clotted left upper extremity AV fistula , the patient is status post left brachial cephalic fistula which is clotted. Patient presents for percutaneous declot. ATTENDING PHYSICIAN: Leschak I accessed the left brachiocephalic fistula just proximal to the arteriovenous anastomosis with a 21-gauge needle. A 7 French sheath was inserted. A 5 French catheter was then negotiated through the occluded AV fistula into the subclavian vein. Contrast was injected which demonstrated patency of the subclavian and innominate veins. No evidence of significant stenosis. The 5 French catheter was pulled back and contrast was injected. There is clot seen throughout the length of the cephalic vein. There is aneurysmal dilatation of the cephalic vein in the mid distal humeral region filled with clot. I then laced the cephalic vein with a 8 mg of TPA. This was allowed to instill for approximately 10 minutes. I then performed percutaneous thrombectomy with a 6 French AngioJet throughout the length of the cephalic vein. This was followed by angioplasty of the cartilage the cephalic vein with a 8 mm diameter balloon and subsequently with a 10 mm diameter  balloon along the aneurysmal part of the cephalic vein. A follow-up fistulogram revealed restored patency of the left brachiocephalic fistula. There appear to be residual stenosis at the origin the cephalic vein. This was re-angioplastied with a 10 mm in diameter balloon. A follow-up fistulogram revealed interval improvement with no visible significant stenosis. There was now a palpable thrill postprocedure. COMPLICATIONS: No immediate complications. The patient tolerated the procedure well and left the interventional radiology suite in stable condition. ESTIMATED BLOOD LOSS: None. CONTRAST USED: 50 mL Omnipaque. FLUOROSCOPY TIME / CUMULATIVE DOSE: 12.4 minutes / 125 mGy. Intravenous conscious sedation  was provided by the radiology nurse using 1 mg of Versed  100 mcg of fentanyl  for a total of 59 minutes. ANTIBIOTICS: See separate Nursing/Anesthesia documentation.   Successful percutaneous declotting of left brachiocephalic fistula with angioplasty. Fistula was open postprocedure with a palpable thrill. *Structured report code: IR.2.1  US  Hemodialysis Access Result Date: 04/14/2024                                                     [facility]                                                                 [address]                                                                 [place] [address]                                       Fistula Ultrasound Report Name  MAKALYNN, BERWANGER [facility]             Study Date  04-14-2024 07 48 AM MRN  [id number]                          Patient Location  [facility] DOB  Jun 02, 1951                        Gender  Female                              Height  66 in Age  23 yrs                            Ethnicity  3                                Weight  225 lb Ordering Physician  DAY, DANIEL DWAYNE Referring Physician  DAY, DANIEL DWAYNE Performed By   GLADIS DILLON Interpretation Summary 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 There is NO sonographic evidence of DVT in the LEFT upper extremity or RIGHT IJV.  Procedure An A-V graft duplex exam was performed. Left Upper Extremity Measurements Left subclavian artery doppler waveform shows high resistance. Left axillary artery doppler waveform shows high resistance. Left brachial artery doppler waveform shows high resistance. Left radial artery doppler waveform shows high resistance. Left ulnar artery doppler waveform shows high resistance. Left subclavian artery velocity is 72.0 cm-sec. Left axillary artery velocity is 116 cm-sec. Left brachial artery velocity is 134 cm-sec. Left radial artery velocity is 79.1 cm-sec. Left ulnar artery velocity is 100.0 cm-sec. Left Upper Extremity Left upper extremity waveforms show high resistance. The left axillary vein is patent with spontaneous and phasic flow. The left subclavian vein is patent with spontaneous and phasic flow. 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 ______________________________________________________________________________ Electronically signed by MD Jerilynn Cheryl Pouch, MD, [phone]   on   04-14-2024 10 37 AM  IR Insert Non Tunnel Cath >62Yr Result Date: 04/13/2024 IR INSERT NON-TUNNEL CATH >58YR, 04/13/2024 3:36 PM INDICATION & HISTORY: needs dialysis , ATTENDING PHYSICIAN: Leschak RESIDENT PHYSICIAN: None. APP: None. 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 The external jugular vein is patent. Image are on file. Using ultrasound guidance I accessed the right external jugular vein with a 21-gauge needle. A 5 French catheter was inserted. Contrast was  injected which demonstrated patency of the SVC. No evidence of stenosis A 0.035 wire was inserted. The tract from the skin to the external jugular vein was serially dilated. A 20 cm triple-lumen dialysis catheter was inserted into the external jugular vein and under fluoroscopic guidance positioned in the right atrium. COMPLICATIONS: No immediate complications. The patient tolerated the procedure well and left the interventional radiology suite in stable condition. ESTIMATED BLOOD LOSS: None. CONTRAST USED: 15 mL Omnipaque. FLUOROSCOPY TIME / CUMULATIVE DOSE: 0.7 minutes SEDATION: Moderate sedation. A pre-procedure assessment included review of the history & physical and relevant diagnostic tests, ensuring that the patient was an appropriate candidate for moderate sedation. An independent, trained nurse ( , RN) administered the IV medication required for sedation while monitoring continuous vital signs and cardiorespiratory function. This RN was present during the entire procedure under the supervision of the attending physician, . The patient returned to baseline mental status prior to transfer from the department. Clinical and physiologic documentation from this procedure is available within the electronic medical record. The patient's age, active monitoring times, and total medication dosages are as follows: *  Total intraservice face-to-face sedation time: 15 minutes MEDICATIONS: Versed  1 mg IV. Fentanyl  50 mcg IV. ANTIBIOTICS: See separate Nursing/Anesthesia documentation.   Placement of a right external jugular vein triple-lumen temporary dialysis catheter. *Structured report code: IR.2.1  ECG 12 lead Result Date: 04/13/2024 Ventricular Rate                   79        BPM                 Atrial Rate                        79        BPM                 P-R Interval  180       ms                  QRS Duration                       82        ms                  Q-T Interval                        408       ms                  QTC Calculation Bazett             467       ms                  Calculated P Axis                  28        degrees             Calculated R Axis                  -23       degrees             Calculated T Axis                  -14       degrees             Sinus rhythm Low voltage QRS, consider pulmonary disease or obesity Cannot rule out Anteroseptal infarct  cited on or before 03-Jun-2018 When compared with ECG of 30-Sep-2022 08 16, Nonspecific T wave abnormality now evident in inferior leads Confirmed by Arne Feast  [id number]  on 04-13-2024 1 11 36 PM   _____________________________________________________________________________ Discharge Instructions:   Discharge Orders     Discharge Diet (specify)     Details:    Diet type: Renal for Dialysis   Comments: Fluid restriction less than 1500 ml per 24 hrs   Discharge instructions - Other (specify)     Comments: Hemodialysis 3 times weekly on Monday ,Wednesday ,Friday.  Hemodialysis through permacath patient needs to rest her AV fistula until she sees vascular surgery Dr. Tina F/U PCP IN 5 DAYS ,CBC,BMP MAGNESIUM IN 5 DAYS   FLUID RESTRICTION <1500 ML /24 HRS   Recommend outpt F/U with vascular surgery  in 10 days with repeat Fistula duplex.   Full Code         Discharge Orders     Discharge Diet (specify)     Details:    Diet type: Renal for Dialysis   Comments: Fluid restriction less than 1500 ml per 24 hrs   Discharge instructions - Other (specify)     Comments: Hemodialysis 3 times weekly on Monday ,Wednesday ,Friday.  Hemodialysis through permacath patient needs to rest her AV fistula until she sees vascular surgery Dr. Tina F/U PCP IN 5 DAYS ,CBC,BMP MAGNESIUM IN 5 DAYS   FLUID RESTRICTION <1500 ML /24 HRS   Recommend outpt F/U with vascular surgery  in 10 days with repeat Fistula duplex.   Full Code

## 2024-04-25 ENCOUNTER — Telehealth: Payer: Self-pay | Admitting: *Deleted

## 2024-04-25 DIAGNOSIS — D631 Anemia in chronic kidney disease: Secondary | ICD-10-CM | POA: Diagnosis not present

## 2024-04-25 DIAGNOSIS — N2581 Secondary hyperparathyroidism of renal origin: Secondary | ICD-10-CM | POA: Diagnosis not present

## 2024-04-25 DIAGNOSIS — N186 End stage renal disease: Secondary | ICD-10-CM | POA: Diagnosis not present

## 2024-04-25 DIAGNOSIS — D509 Iron deficiency anemia, unspecified: Secondary | ICD-10-CM | POA: Diagnosis not present

## 2024-04-25 NOTE — Transitions of Care (Post Inpatient/ED Visit) (Signed)
   04/25/2024  Name: Jennifer Payne MRN: [id number] DOB: [birth year]  Today's TOC FU Call Status: Today's TOC FU Call Status:: Unsuccessful Call (1st Attempt) Unsuccessful Call (1st Attempt) Date: 04/25/24  Attempted to reach the patient regarding the most recent Inpatient/ED visit.  Follow Up Plan: Additional outreach attempts will be made to reach the patient to complete the Transitions of Care (Post Inpatient/ED visit) call.   Andrea Dimes RN, BSN [place]  [facility] RN Care Manager [phone]

## 2024-04-26 ENCOUNTER — Telehealth: Payer: Self-pay | Admitting: *Deleted

## 2024-04-26 NOTE — Transitions of Care (Post Inpatient/ED Visit) (Signed)
   04/26/2024  Name: Jennifer Payne MRN: [id number] DOB: [birth year]  Today's TOC FU Call Status: Today's TOC FU Call Status:: Successful TOC FU Call Completed TOC FU Call Complete Date: 04/26/24 Patient's Name and Date of Birth confirmed.  Transition Care Management Follow-up Telephone Call Date of Discharge: 04/22/24 Discharge Facility: Other ([facility]) Name of Other ([facility]) [facility]charge Facility: [facility] Type of Discharge: Inpatient Admission Primary Inpatient Discharge Diagnosis:: ESRD needing dialysis How have you been since you were released from the hospital?: Same Any questions or concerns?: No  Items Reviewed: Did you receive and understand the discharge instructions provided?: Yes Medications obtained,verified, and reconciled?: Yes (Medications Reviewed) Any new allergies since your discharge?: No Dietary orders reviewed?: Yes Type of Diet Ordered:: renal diet Do you have support at home?: Yes People in Home [RPT]: other relative(s) Name of Support/Comfort Primary Source: family-names not provided  Medications Reviewed Today: Medications Reviewed Today     Reviewed by Lucky Andrea LABOR, RN (Registered Nurse) on 04/26/24 at 1446  Med List Status: <None>   Medication Order Taking? Sig Documenting Provider Last Dose Status Informant  atorvastatin  (LIPITOR) 80 MG tablet [id number] Yes Take 1 tablet (80 mg total) by mouth daily. Petrina Pries, NP  Active   calcium  acetate (PHOSLO) 667 MG tablet [id number] Yes TAKE 1 TABLET BY MOUTH ONCE DAILY WITH MEALS [provider]  Active   levothyroxine  (SYNTHROID ) 88 MCG tablet [id number] Yes Take 1 tablet (88 mcg total) by mouth daily. Petrina Pries, NP  Active   polyethylene glycol (MIRALAX / GLYCOLAX) 17 g packet [id number] Yes Take 17 g by mouth daily as needed. [provider]  Active   sevelamer carbonate (RENVELA) 800 MG tablet [id number] Yes TAKE 3 TABLETS BY MOUTH WITH MEALS [provider]   Active             Home Care and Equipment/Supplies: Were Home Health Services Ordered?: No Any new equipment or medical supplies ordered?: No  Functional Questionnaire: Do you need assistance with bathing/showering or dressing?: No Do you need assistance with meal preparation?: No Do you need assistance with eating?: No Do you have difficulty maintaining continence: No Do you need assistance with getting out of bed/getting out of a chair/moving?: No Do you have difficulty managing or taking your medications?: No  Follow up appointments reviewed: PCP Follow-up appointment confirmed?: Yes Date of PCP follow-up appointment?: 04/28/24 (Patient may reschedule this appt) Follow-up Provider: Dr. Jarold Specialist [facility] Follow-up appointment confirmed?: Yes Date of Specialist follow-up appointment?: 05/05/24 Follow-Up Specialty Provider:: Dr. Ronell for AV fistula assessment Do you need transportation to your follow-up appointment?: No Do you understand care options if your condition(s) worsen?: Yes-patient verbalized understanding  SDOH Interventions Today    Flowsheet Row Most Recent Value  SDOH Interventions   Food Insecurity Interventions Intervention Not Indicated  Housing Interventions Intervention Not Indicated  Transportation Interventions Intervention Not Indicated  Utilities Interventions Intervention Not Indicated    Andrea Lucky RN, BSN   [facility] RN Care Manager [phone]

## 2024-04-27 DIAGNOSIS — D631 Anemia in chronic kidney disease: Secondary | ICD-10-CM | POA: Diagnosis not present

## 2024-04-27 DIAGNOSIS — E119 Type 2 diabetes mellitus without complications: Secondary | ICD-10-CM | POA: Diagnosis not present

## 2024-04-27 DIAGNOSIS — N186 End stage renal disease: Secondary | ICD-10-CM | POA: Diagnosis not present

## 2024-04-27 DIAGNOSIS — E785 Hyperlipidemia, unspecified: Secondary | ICD-10-CM | POA: Diagnosis not present

## 2024-04-27 DIAGNOSIS — N2581 Secondary hyperparathyroidism of renal origin: Secondary | ICD-10-CM | POA: Diagnosis not present

## 2024-04-27 DIAGNOSIS — Z23 Encounter for immunization: Secondary | ICD-10-CM | POA: Diagnosis not present

## 2024-04-27 DIAGNOSIS — E039 Hypothyroidism, unspecified: Secondary | ICD-10-CM | POA: Diagnosis not present

## 2024-04-27 DIAGNOSIS — D509 Iron deficiency anemia, unspecified: Secondary | ICD-10-CM | POA: Diagnosis not present

## 2024-04-28 ENCOUNTER — Encounter: Payer: Self-pay | Admitting: Internal Medicine

## 2024-04-28 ENCOUNTER — Inpatient Hospital Stay: Admitting: Internal Medicine

## 2024-04-28 NOTE — Progress Notes (Deleted)
 I,Neeka Urista T Emmitt, CMA,acting as a Neurosurgeon for Jennifer LOISE Slocumb, MD.,have documented all relevant documentation on the behalf of Jennifer LOISE Slocumb, MD,as directed by  Jennifer LOISE Slocumb, MD while in the presence of Jennifer LOISE Slocumb, MD.  Subjective:  Patient ID: Jennifer Payne , female    DOB: May 13, 1951 , 73 y.o.   MRN: [id number]  No chief complaint on file.   HPI  Patient presents today for hospital follow up. [facility] & discharged on 9/26 for Vascular access problem. Today she reports feeling      Past Medical History:  Diagnosis Date  . Cataract   . Diabetes mellitus   . Hyperlipidemia   . Hypertension   . Thyroid  disease      Family History  Problem Relation Age of Onset  . Alzheimer's disease Mother   . Coronary artery disease Mother   . Hyperlipidemia Mother   . Obesity Mother   . Hypertension Father   . Colon cancer Neg Hx      Current Outpatient Medications:  .  atorvastatin  (LIPITOR) 80 MG tablet, Take 1 tablet (80 mg total) by mouth daily., Disp: 90 tablet, Rfl: 1 .  calcium  acetate (PHOSLO) 667 MG tablet, TAKE 1 TABLET BY MOUTH ONCE DAILY WITH MEALS (Patient taking differently: Take 667 mg by mouth 2 (two) times daily.), Disp: , Rfl:  .  levothyroxine  (SYNTHROID ) 88 MCG tablet, Take 1 tablet (88 mcg total) by mouth daily., Disp: 90 tablet, Rfl: 1 .  polyethylene glycol (MIRALAX / GLYCOLAX) 17 g packet, Take 17 g by mouth daily as needed., Disp: , Rfl:  .  sevelamer carbonate (RENVELA) 800 MG tablet, TAKE 3 TABLETS BY MOUTH WITH MEALS, Disp: , Rfl:    Allergies  Allergen Reactions  . Erythromycin Rash     Review of Systems  Constitutional: Negative.   Respiratory: Negative.    Cardiovascular: Negative.   Neurological: Negative.   Psychiatric/Behavioral: Negative.       There were no vitals filed for this visit. There is no height or weight on file to calculate BMI.  Wt Readings from Last 3 Encounters:  02/11/24 230 lb 14.4 oz (104.7  kg)  12/31/23 235 lb (106.6 kg)  09/03/23 232 lb 3.2 oz (105.3 kg)     Objective:  Physical Exam      Assessment And Plan:  There are no diagnoses linked to this encounter.   No follow-ups on file.  Patient was given opportunity to ask questions. Patient verbalized understanding of the plan and was able to repeat key elements of the plan. All questions were answered to their satisfaction.  Jennifer LOISE Slocumb, MD  I, Jennifer LOISE Slocumb, MD, have reviewed all documentation for this visit. The documentation on 04/28/24 for the exam, diagnosis, procedures, and orders are all accurate and complete.   IF YOU HAVE BEEN REFERRED TO A SPECIALIST, IT MAY TAKE 1-2 WEEKS TO SCHEDULE/PROCESS THE REFERRAL. IF YOU HAVE NOT HEARD FROM US /SPECIALIST IN TWO WEEKS, PLEASE GIVE US  A CALL AT [phone].   THE PATIENT IS ENCOURAGED TO PRACTICE SOCIAL DISTANCING DUE TO THE COVID-19 PANDEMIC.

## 2024-04-29 DIAGNOSIS — N186 End stage renal disease: Secondary | ICD-10-CM | POA: Diagnosis not present

## 2024-04-29 DIAGNOSIS — D509 Iron deficiency anemia, unspecified: Secondary | ICD-10-CM | POA: Diagnosis not present

## 2024-04-29 DIAGNOSIS — D631 Anemia in chronic kidney disease: Secondary | ICD-10-CM | POA: Diagnosis not present

## 2024-04-29 DIAGNOSIS — E785 Hyperlipidemia, unspecified: Secondary | ICD-10-CM | POA: Diagnosis not present

## 2024-04-29 DIAGNOSIS — N2581 Secondary hyperparathyroidism of renal origin: Secondary | ICD-10-CM | POA: Diagnosis not present

## 2024-04-29 DIAGNOSIS — Z23 Encounter for immunization: Secondary | ICD-10-CM | POA: Diagnosis not present

## 2024-04-29 NOTE — Progress Notes (Signed)
 Jennifer Payne                                          MRN: [id number]   04/29/2024   The VBCI Quality Team Specialist reviewed this patient medical record for the purposes of chart review for care gap closure. The following were reviewed: chart review for care gap closure-glycemic status assessment.    VBCI Quality Team

## 2024-05-02 DIAGNOSIS — N186 End stage renal disease: Secondary | ICD-10-CM | POA: Diagnosis not present

## 2024-05-02 DIAGNOSIS — D631 Anemia in chronic kidney disease: Secondary | ICD-10-CM | POA: Diagnosis not present

## 2024-05-02 DIAGNOSIS — E785 Hyperlipidemia, unspecified: Secondary | ICD-10-CM | POA: Diagnosis not present

## 2024-05-02 DIAGNOSIS — Z23 Encounter for immunization: Secondary | ICD-10-CM | POA: Diagnosis not present

## 2024-05-02 DIAGNOSIS — D509 Iron deficiency anemia, unspecified: Secondary | ICD-10-CM | POA: Diagnosis not present

## 2024-05-05 DIAGNOSIS — T82858A Stenosis of vascular prosthetic devices, implants and grafts, initial encounter: Secondary | ICD-10-CM | POA: Diagnosis not present

## 2024-05-05 DIAGNOSIS — Z992 Dependence on renal dialysis: Secondary | ICD-10-CM | POA: Diagnosis not present

## 2024-05-05 DIAGNOSIS — N186 End stage renal disease: Secondary | ICD-10-CM | POA: Diagnosis not present

## 2024-05-09 DIAGNOSIS — Z23 Encounter for immunization: Secondary | ICD-10-CM | POA: Diagnosis not present

## 2024-05-13 DIAGNOSIS — D509 Iron deficiency anemia, unspecified: Secondary | ICD-10-CM | POA: Diagnosis not present

## 2024-05-13 DIAGNOSIS — N2581 Secondary hyperparathyroidism of renal origin: Secondary | ICD-10-CM | POA: Diagnosis not present

## 2024-05-13 DIAGNOSIS — N186 End stage renal disease: Secondary | ICD-10-CM | POA: Diagnosis not present

## 2024-05-13 DIAGNOSIS — E785 Hyperlipidemia, unspecified: Secondary | ICD-10-CM | POA: Diagnosis not present

## 2024-05-13 DIAGNOSIS — Z23 Encounter for immunization: Secondary | ICD-10-CM | POA: Diagnosis not present

## 2024-05-16 DIAGNOSIS — N186 End stage renal disease: Secondary | ICD-10-CM | POA: Diagnosis not present

## 2024-05-17 ENCOUNTER — Other Ambulatory Visit: Payer: Self-pay | Admitting: Internal Medicine

## 2024-05-17 DIAGNOSIS — Z1231 Encounter for screening mammogram for malignant neoplasm of breast: Secondary | ICD-10-CM

## 2024-05-18 DIAGNOSIS — E785 Hyperlipidemia, unspecified: Secondary | ICD-10-CM | POA: Diagnosis not present

## 2024-05-18 DIAGNOSIS — N2581 Secondary hyperparathyroidism of renal origin: Secondary | ICD-10-CM | POA: Diagnosis not present

## 2024-05-18 DIAGNOSIS — D509 Iron deficiency anemia, unspecified: Secondary | ICD-10-CM | POA: Diagnosis not present

## 2024-05-18 DIAGNOSIS — N186 End stage renal disease: Secondary | ICD-10-CM | POA: Diagnosis not present

## 2024-05-18 DIAGNOSIS — Z23 Encounter for immunization: Secondary | ICD-10-CM | POA: Diagnosis not present

## 2024-05-20 DIAGNOSIS — D631 Anemia in chronic kidney disease: Secondary | ICD-10-CM | POA: Diagnosis not present

## 2024-05-25 DIAGNOSIS — D509 Iron deficiency anemia, unspecified: Secondary | ICD-10-CM | POA: Diagnosis not present

## 2024-05-26 ENCOUNTER — Ambulatory Visit: Admitting: Family Medicine

## 2024-05-26 ENCOUNTER — Encounter: Payer: Self-pay | Admitting: Family Medicine

## 2024-05-26 VITALS — BP 116/80 | HR 73 | Temp 98.2°F | Ht 67.0 in | Wt 231.0 lb

## 2024-05-26 DIAGNOSIS — E66812 Obesity, class 2: Secondary | ICD-10-CM | POA: Diagnosis not present

## 2024-05-26 DIAGNOSIS — I770 Arteriovenous fistula, acquired: Secondary | ICD-10-CM

## 2024-05-26 DIAGNOSIS — E1122 Type 2 diabetes mellitus with diabetic chronic kidney disease: Secondary | ICD-10-CM | POA: Diagnosis not present

## 2024-05-26 DIAGNOSIS — Z6836 Body mass index (BMI) 36.0-36.9, adult: Secondary | ICD-10-CM | POA: Diagnosis not present

## 2024-05-26 DIAGNOSIS — Z8616 Personal history of COVID-19: Secondary | ICD-10-CM

## 2024-05-26 DIAGNOSIS — E039 Hypothyroidism, unspecified: Secondary | ICD-10-CM | POA: Diagnosis not present

## 2024-05-26 DIAGNOSIS — Z992 Dependence on renal dialysis: Secondary | ICD-10-CM | POA: Diagnosis not present

## 2024-05-26 DIAGNOSIS — N186 End stage renal disease: Secondary | ICD-10-CM | POA: Diagnosis not present

## 2024-05-26 DIAGNOSIS — I120 Hypertensive chronic kidney disease with stage 5 chronic kidney disease or end stage renal disease: Secondary | ICD-10-CM

## 2024-05-26 MED ORDER — LEVOTHYROXINE SODIUM 100 MCG PO TABS: 100.0000 ug | ORAL_TABLET | Freq: Every day | ORAL | Status: DC

## 2024-05-26 NOTE — Progress Notes (Signed)
 I,[name], CMA,acting as a neurosurgeon for [employer], NP.,have documented all relevant documentation on the behalf of Bruna Creighton, NP,as directed by  Bruna Creighton, NP while in the presence of Bruna Creighton, NP.  Subjective:  Patient ID: Jennifer Payne , female    DOB: 09-13-1950 , 73 y.o.   MRN: [id number]  Chief Complaint  Patient presents with   Hospitalization Follow-up    Follow up Hospitalization  Patient was admitted to  on  and discharged on . She was treated for . Treatment for this included . Telephone follow up was done on  She reports good compliance with treatment. She reports this condition is improved.    Patient reports she is feeling better. She reports she felt like she was actively bleeding in the inside. Her skin was purple.     HPI Discussed the use of AI scribe software for clinical note transcription with the patient, who gave verbal consent to proceed.  History of Present Illness     Jennifer Payne is a 73 year old female with end-stage renal disease on dialysis who presents for hospital follow-up after a prolonged admission.  She was hospitalized from September 17 to April 22, 2024, due to complications with her dialysis fistula and a COVID-19 infection. Her fistula clotted, and she was sent to the hospital. During her stay, she was treated for COVID-19, which she suspected due to prior symptoms of diarrhea and feeling unwell.  She has been on dialysis, attending sessions on Monday, Wednesday, and Friday. Her fistula, placed in 2021, has clotted before, requiring intervention. During the recent hospitalization, her fistula was treated without the placement of a new graft. She experienced significant bruising and bleeding, which she reports has now resolved.  While hospitalized, she was treated for a respiratory infection related to COVID-19 and received antibiotics. She feels better now, with no shortness of breath or chest pain, though she  mentions having a mild cold that is gradually resolving.  Her current medications include heparin, which she takes with her dialysis, and levothyroxine , which was increased from 88 mcg to 100 mcg after her thyroid  levels were found to be low. She picked up a one-month supply of the increased dose from [employer].  She also mentions having a temporary port placed for dialysis while her fistula was healing, which is scheduled to be removed on June 02, 2024. She has a mammogram scheduled for June 16, 2024, and is awaiting a call to schedule a colonoscopy. She received a flu shot at the dialysis center.      Past Medical History:  Diagnosis Date   Cataract    Diabetes mellitus    Hyperlipidemia    Hypertension    Thyroid  disease      Family History  Problem Relation Age of Onset   Alzheimer's disease Mother    Coronary artery disease Mother    Hyperlipidemia Mother    Obesity Mother    Hypertension Father    Colon cancer Neg Hx      Current Outpatient Medications:    atorvastatin  (LIPITOR) 80 MG tablet, Take 1 tablet (80 mg total) by mouth daily., Disp: 90 tablet, Rfl: 1   calcium  acetate (PHOSLO) 667 MG tablet, TAKE 1 TABLET BY MOUTH ONCE DAILY WITH MEALS, Disp: , Rfl:    polyethylene glycol (MIRALAX / GLYCOLAX) 17 g packet, Take 17 g by mouth daily as needed., Disp: , Rfl:    sevelamer carbonate (RENVELA) 800 MG tablet, TAKE 3  TABLETS BY MOUTH WITH MEALS, Disp: , Rfl:    Allergies  Allergen Reactions   Erythromycin Rash     Review of Systems  Constitutional: Negative.   HENT: Negative.    Respiratory: Negative.    Cardiovascular: Negative.   Psychiatric/Behavioral: Negative.       Today's Vitals   05/26/24 1028  BP: 116/80  Pulse: 73  Temp: 98.2 F (36.8 C)  TempSrc: Oral  Weight: 231 lb (104.8 kg)  Height: 5' 7 (1.702 m)  PainSc: 0-No pain   Body mass index is 36.18 kg/m.  Wt Readings from Last 3 Encounters:  05/26/24 231 lb (104.8 kg)  02/11/24 230  lb 14.4 oz (104.7 kg)  12/31/23 235 lb (106.6 kg)    The ASCVD Risk score (Arnett DK, et al., 2019) failed to calculate for the following reasons:   Risk score cannot be calculated because patient has a medical history suggesting prior/existing ASCVD  Objective:  Physical Exam Constitutional:      Appearance: Normal appearance.  Cardiovascular:     Rate and Rhythm: Normal rate and regular rhythm.     Pulses: Normal pulses.     Heart sounds: Normal heart sounds.  Pulmonary:     Effort: Pulmonary effort is normal.     Breath sounds: Normal breath sounds.  Abdominal:     General: Bowel sounds are normal.  Neurological:     Mental Status: She is alert.         Assessment And Plan:  Primary hypothyroidism Assessment & Plan: Thyroid  medication adjusted by nephrologist due to low levels.  -Levothyroxine  increased to 100 mcg. One-month supply available. Levels monitored during dialysis. - Continue levothyroxine  100 mcg daily until next evaluation.   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 - Remove temporary catheter on November 6 at [facility]   Class 2 severe obesity due to excess calories with serious comorbidity and body mass index (BMI) of 36.0 to 36.9 in adult Assessment & Plan: She is encouraged to strive for BMI less than 30 to decrease cardiac risk.  Advised to aim for at least 150 minutes of exercise per week.    History of COVID-19 Assessment & Plan: Tested positive for COVID-19 during hospitalization, treated for respiratory infection.  Symptoms resolved, mild cold improving.     Assessment & Plan End Stage Renal Disease on Hemodialysis with Recent Arteriovenous Fistula Thrombosis, Post-Intervention Hospitalized for arteriovenous fistula thrombosis requiring intervention. Fistulogram performed, temporary catheter placed. Bruising and hematoma  resolved. Continues regular dialysis. - Continue hemodialysis on Monday, Wednesday, and Friday schedule. - Remove temporary catheter on November 6 at [facility].  Recent COVID-19 Infection, Resolved Tested positive for COVID-19 during hospitalization, treated for respiratory infection. Symptoms resolved, mild cold improving.  Hypothyroidism Thyroid  medication adjusted by nephrologist due to low levels. Levothyroxine  increased to 100 mcg. One-month supply available. Levels monitored during dialysis. - Continue levothyroxine  100 mcg daily until next evaluation. - Monitor thyroid  levels during monthly dialysis blood tests.  General Health Maintenance Mammogram scheduled, colonoscopy pending. Flu shot received. - Complete mammogram on November 20. - Schedule and complete colonoscopy. - Document flu shot received at dialysis center.   Return if symptoms worsen or fail to improve, for Keep schedule appts, AWV .  Patient was given opportunity to ask questions. Patient verbalized understanding of the plan and was able to repeat key elements of the plan. All  questions were answered to their satisfaction.    I, Bruna Creighton, NP, have reviewed all documentation for this visit. The documentation on 06/07/2024 for the exam, diagnosis, procedures, and orders are all accurate and complete.    IF YOU HAVE BEEN REFERRED TO A SPECIALIST, IT MAY TAKE 1-2 WEEKS TO SCHEDULE/PROCESS THE REFERRAL. IF YOU HAVE NOT HEARD FROM US /SPECIALIST IN TWO WEEKS, PLEASE GIVE US  A CALL AT [phone].

## 2024-05-27 DIAGNOSIS — N2581 Secondary hyperparathyroidism of renal origin: Secondary | ICD-10-CM | POA: Diagnosis not present

## 2024-05-27 DIAGNOSIS — N186 End stage renal disease: Secondary | ICD-10-CM | POA: Diagnosis not present

## 2024-05-27 DIAGNOSIS — D509 Iron deficiency anemia, unspecified: Secondary | ICD-10-CM | POA: Diagnosis not present

## 2024-05-27 DIAGNOSIS — E785 Hyperlipidemia, unspecified: Secondary | ICD-10-CM | POA: Diagnosis not present

## 2024-05-27 DIAGNOSIS — Z23 Encounter for immunization: Secondary | ICD-10-CM | POA: Diagnosis not present

## 2024-05-27 DIAGNOSIS — Z992 Dependence on renal dialysis: Secondary | ICD-10-CM | POA: Diagnosis not present

## 2024-05-27 DIAGNOSIS — D631 Anemia in chronic kidney disease: Secondary | ICD-10-CM | POA: Diagnosis not present

## 2024-05-30 DIAGNOSIS — N2581 Secondary hyperparathyroidism of renal origin: Secondary | ICD-10-CM | POA: Diagnosis not present

## 2024-05-30 DIAGNOSIS — D509 Iron deficiency anemia, unspecified: Secondary | ICD-10-CM | POA: Diagnosis not present

## 2024-05-30 DIAGNOSIS — D631 Anemia in chronic kidney disease: Secondary | ICD-10-CM | POA: Diagnosis not present

## 2024-05-30 DIAGNOSIS — N186 End stage renal disease: Secondary | ICD-10-CM | POA: Diagnosis not present

## 2024-05-30 DIAGNOSIS — Z418 Encounter for other procedures for purposes other than remedying health state: Secondary | ICD-10-CM | POA: Diagnosis not present

## 2024-06-01 DIAGNOSIS — E1151 Type 2 diabetes mellitus with diabetic peripheral angiopathy without gangrene: Secondary | ICD-10-CM | POA: Diagnosis not present

## 2024-06-01 DIAGNOSIS — I120 Hypertensive chronic kidney disease with stage 5 chronic kidney disease or end stage renal disease: Secondary | ICD-10-CM | POA: Diagnosis not present

## 2024-06-01 DIAGNOSIS — Z418 Encounter for other procedures for purposes other than remedying health state: Secondary | ICD-10-CM | POA: Diagnosis not present

## 2024-06-01 DIAGNOSIS — E785 Hyperlipidemia, unspecified: Secondary | ICD-10-CM | POA: Diagnosis not present

## 2024-06-01 DIAGNOSIS — D631 Anemia in chronic kidney disease: Secondary | ICD-10-CM | POA: Diagnosis not present

## 2024-06-01 DIAGNOSIS — I872 Venous insufficiency (chronic) (peripheral): Secondary | ICD-10-CM | POA: Diagnosis not present

## 2024-06-01 DIAGNOSIS — Z8249 Family history of ischemic heart disease and other diseases of the circulatory system: Secondary | ICD-10-CM | POA: Diagnosis not present

## 2024-06-01 DIAGNOSIS — N186 End stage renal disease: Secondary | ICD-10-CM | POA: Diagnosis not present

## 2024-06-01 DIAGNOSIS — D509 Iron deficiency anemia, unspecified: Secondary | ICD-10-CM | POA: Diagnosis not present

## 2024-06-01 DIAGNOSIS — N2581 Secondary hyperparathyroidism of renal origin: Secondary | ICD-10-CM | POA: Diagnosis not present

## 2024-06-01 DIAGNOSIS — E039 Hypothyroidism, unspecified: Secondary | ICD-10-CM | POA: Diagnosis not present

## 2024-06-01 DIAGNOSIS — E1142 Type 2 diabetes mellitus with diabetic polyneuropathy: Secondary | ICD-10-CM | POA: Diagnosis not present

## 2024-06-06 DIAGNOSIS — Z418 Encounter for other procedures for purposes other than remedying health state: Secondary | ICD-10-CM | POA: Diagnosis not present

## 2024-06-06 DIAGNOSIS — N2581 Secondary hyperparathyroidism of renal origin: Secondary | ICD-10-CM | POA: Diagnosis not present

## 2024-06-06 DIAGNOSIS — D631 Anemia in chronic kidney disease: Secondary | ICD-10-CM | POA: Diagnosis not present

## 2024-06-06 DIAGNOSIS — D509 Iron deficiency anemia, unspecified: Secondary | ICD-10-CM | POA: Diagnosis not present

## 2024-06-07 DIAGNOSIS — Z8616 Personal history of COVID-19: Secondary | ICD-10-CM | POA: Insufficient documentation

## 2024-06-07 NOTE — Assessment & Plan Note (Signed)
 Tested positive for COVID-19 during hospitalization, treated for respiratory infection.  Symptoms resolved, mild cold improving.

## 2024-06-07 NOTE — Assessment & Plan Note (Signed)
-   Continue hemodialysis on Monday, Wednesday, and Friday schedule. - Remove temporary catheter on November 6 at [facility].

## 2024-06-07 NOTE — Assessment & Plan Note (Signed)
 She is encouraged to strive for BMI less than 30 to decrease cardiac risk. Advised to aim for at least 150 minutes of exercise per week.

## 2024-06-07 NOTE — Assessment & Plan Note (Signed)
 Thyroid  medication adjusted by nephrologist due to low levels.  -Levothyroxine  increased to 100 mcg. One-month supply available. Levels monitored during dialysis. - Continue levothyroxine  100 mcg daily until next evaluation.

## 2024-06-08 DIAGNOSIS — N186 End stage renal disease: Secondary | ICD-10-CM | POA: Diagnosis not present

## 2024-06-08 DIAGNOSIS — D631 Anemia in chronic kidney disease: Secondary | ICD-10-CM | POA: Diagnosis not present

## 2024-06-08 DIAGNOSIS — D509 Iron deficiency anemia, unspecified: Secondary | ICD-10-CM | POA: Diagnosis not present

## 2024-06-08 DIAGNOSIS — N2581 Secondary hyperparathyroidism of renal origin: Secondary | ICD-10-CM | POA: Diagnosis not present

## 2024-06-08 DIAGNOSIS — Z418 Encounter for other procedures for purposes other than remedying health state: Secondary | ICD-10-CM | POA: Diagnosis not present

## 2024-06-10 DIAGNOSIS — Z418 Encounter for other procedures for purposes other than remedying health state: Secondary | ICD-10-CM | POA: Diagnosis not present

## 2024-06-10 DIAGNOSIS — N2581 Secondary hyperparathyroidism of renal origin: Secondary | ICD-10-CM | POA: Diagnosis not present

## 2024-06-10 DIAGNOSIS — N186 End stage renal disease: Secondary | ICD-10-CM | POA: Diagnosis not present

## 2024-06-10 DIAGNOSIS — D509 Iron deficiency anemia, unspecified: Secondary | ICD-10-CM | POA: Diagnosis not present

## 2024-06-13 DIAGNOSIS — N186 End stage renal disease: Secondary | ICD-10-CM | POA: Diagnosis not present

## 2024-06-13 DIAGNOSIS — N2581 Secondary hyperparathyroidism of renal origin: Secondary | ICD-10-CM | POA: Diagnosis not present

## 2024-06-13 DIAGNOSIS — D509 Iron deficiency anemia, unspecified: Secondary | ICD-10-CM | POA: Diagnosis not present

## 2024-06-13 DIAGNOSIS — D631 Anemia in chronic kidney disease: Secondary | ICD-10-CM | POA: Diagnosis not present

## 2024-06-14 DIAGNOSIS — Z992 Dependence on renal dialysis: Secondary | ICD-10-CM | POA: Diagnosis not present

## 2024-06-14 DIAGNOSIS — T82868A Thrombosis of vascular prosthetic devices, implants and grafts, initial encounter: Secondary | ICD-10-CM | POA: Diagnosis not present

## 2024-06-14 DIAGNOSIS — T82590S Other mechanical complication of surgically created arteriovenous fistula, sequela: Secondary | ICD-10-CM | POA: Diagnosis not present

## 2024-06-14 DIAGNOSIS — N186 End stage renal disease: Secondary | ICD-10-CM | POA: Diagnosis not present

## 2024-06-15 DIAGNOSIS — D631 Anemia in chronic kidney disease: Secondary | ICD-10-CM | POA: Diagnosis not present

## 2024-06-15 DIAGNOSIS — N2581 Secondary hyperparathyroidism of renal origin: Secondary | ICD-10-CM | POA: Diagnosis not present

## 2024-06-15 DIAGNOSIS — Z418 Encounter for other procedures for purposes other than remedying health state: Secondary | ICD-10-CM | POA: Diagnosis not present

## 2024-06-15 DIAGNOSIS — N186 End stage renal disease: Secondary | ICD-10-CM | POA: Diagnosis not present

## 2024-06-15 DIAGNOSIS — D509 Iron deficiency anemia, unspecified: Secondary | ICD-10-CM | POA: Diagnosis not present

## 2024-06-16 ENCOUNTER — Ambulatory Visit
Admission: RE | Admit: 2024-06-16 | Discharge: 2024-06-16 | Disposition: A | Source: Ambulatory Visit | Attending: Internal Medicine | Admitting: Internal Medicine

## 2024-06-16 DIAGNOSIS — Z1231 Encounter for screening mammogram for malignant neoplasm of breast: Secondary | ICD-10-CM

## 2024-06-21 ENCOUNTER — Ambulatory Visit

## 2024-06-21 DIAGNOSIS — Z0000 Encounter for general adult medical examination without abnormal findings: Secondary | ICD-10-CM

## 2024-06-21 NOTE — Patient Instructions (Addendum)
 Jennifer Payne,  Thank you for taking the time for your Medicare Wellness Visit. I appreciate your continued commitment to your health goals. Please review the care plan we discussed, and feel free to reach out if I can assist you further.  Please note that Annual Wellness Visits do not include a physical exam. Some assessments may be limited, especially if the visit was conducted virtually. If needed, we may recommend an in-person follow-up with your provider.  Ongoing Care Seeing your primary care provider every 3 to 6 months helps us  monitor your health and provide consistent, personalized care.   Referrals If a referral was made during today's visit and you haven't received any updates within two weeks, please contact the referred provider directly to check on the status.  Recommended Screenings:  Health Maintenance  Topic Date Due   COVID-19 Vaccine (4 - 2025-26 season) 03/28/2024   Medicare Annual Wellness Visit  04/15/2024   Complete foot exam   04/27/2024   Colon Cancer Screening  05/24/2024   Hemoglobin A1C  07/01/2024   Eye exam for diabetics  02/01/2025   Breast Cancer Screening  06/16/2026   DTaP/Tdap/Td vaccine (3 - Td or Tdap) 04/27/2033   Pneumococcal Vaccine for age over 59  Completed   Flu Shot  Completed   Osteoporosis screening with Bone Density Scan  Completed   Hepatitis C Screening  Completed   Zoster (Shingles) Vaccine  Completed   Meningitis B Vaccine  Aged Out       06/21/2024   11:23 AM  Advanced Directives  Does Patient Have a Medical Advance Directive? Yes  Type of Advance Directive Healthcare Power of Attorney  Copy of Healthcare Power of Attorney in Chart? No - copy requested    Vision: Annual vision screenings are recommended for early detection of glaucoma, cataracts, and diabetic retinopathy. These exams can also reveal signs of chronic conditions such as diabetes and high blood pressure.  Dental: Annual dental screenings help detect early signs  of oral cancer, gum disease, and other conditions linked to overall health, including heart disease and diabetes.  Please see the attached documents for additional preventive care recommendations.

## 2024-06-21 NOTE — Progress Notes (Signed)
 Chief Complaint  Patient presents with   Medicare Wellness     Subjective:   Jennifer Payne is a 73 y.o. female who presents for a Medicare Annual Wellness Visit.  Allergies (verified) Erythromycin   History: Past Medical History:  Diagnosis Date   Cataract    Diabetes mellitus    Hyperlipidemia    Hypertension    Thyroid  disease    Past Surgical History:  Procedure Laterality Date   AV FISTULA PLACEMENT Left 10/2019   CATARACT EXTRACTION     COLONOSCOPY  2009   TONSILLECTOMY  1973   Family History  Problem Relation Age of Onset   Alzheimer's disease Mother    Coronary artery disease Mother    Hyperlipidemia Mother    Obesity Mother    Hypertension Father    Colon cancer Neg Hx    Breast cancer Neg Hx    Social History   Occupational History   Occupation: retired  Tobacco Use   Smoking status: Never   Smokeless tobacco: Never  Vaping Use   Vaping status: Never Used  Substance and Sexual Activity   Alcohol use: No   Drug use: No   Sexual activity: Not Currently   Tobacco Counseling Counseling given: Not Answered  SDOH Screenings   Food Insecurity: No Food Insecurity (06/21/2024)  Housing: Unknown (06/21/2024)  Transportation Needs: No Transportation Needs (06/21/2024)  Utilities: Not At Risk (06/21/2024)  Alcohol Screen: Low Risk  (06/21/2024)  Depression (PHQ2-9): Low Risk  (06/21/2024)  Financial Resource Strain: Low Risk  (06/21/2024)  Physical Activity: Inactive (06/21/2024)  Social Connections: Moderately Integrated (06/21/2024)  Stress: No Stress Concern Present (06/21/2024)  Tobacco Use: Low Risk  (06/21/2024)  Health Literacy: Adequate Health Literacy (06/21/2024)   See flowsheets for full screening details  Depression Screen PHQ 2 & 9 Depression Scale- Over the past 2 weeks, how often have you been bothered by any of the following problems? Little interest or pleasure in doing things: 0 Feeling down, depressed, or hopeless  (PHQ Adolescent also includes...irritable): 0 PHQ-2 Total Score: 0 Trouble falling or staying asleep, or sleeping too much: 0 Feeling tired or having little energy: 0 Poor appetite or overeating (PHQ Adolescent also includes...weight loss): 0 Feeling bad about yourself - or that you are a failure or have let yourself or your family down: 0 Trouble concentrating on things, such as reading the newspaper or watching television (PHQ Adolescent also includes...like school work): 0 Moving or speaking so slowly that other people could have noticed. Or the opposite - being so fidgety or restless that you have been moving around a lot more than usual: 0 Thoughts that you would be better off dead, or of hurting yourself in some way: 0 PHQ-9 Total Score: 0 If you checked off any problems, how difficult have these problems made it for you to do your work, take care of things at home, or get along with other people?: Not difficult at all     Goals Addressed             This Visit's Progress    Patient Stated       06/21/2024, wants to get fistula fixed       Visit info / Clinical Intake: Medicare Wellness Visit Type:: Subsequent Annual Wellness Visit Persons participating in visit:: patient Medicare Wellness Visit Mode:: Telephone If telephone:: video error Because this visit was a virtual/telehealth visit:: unable to obtan vitals due to lack of equipment If Telephone or Video please  confirm:: I connected with the patient using audio enabled telemedicine application and verified that I am speaking with the correct person using two identifiers; I discussed the limitations of evaluation and management by telemedicine; The patient expressed understanding and agreed to proceed Patient Location:: home Provider Location:: office Information given by:: patient Interpreter Needed?: No Pre-visit prep was completed: yes AWV questionnaire completed by patient prior to visit?: no Living arrangements::  with family/others Patient's Overall Health Status Rating: good Typical amount of pain: some Does pain affect daily life?: no Are you currently prescribed opioids?: no  Dietary Habits and Nutritional Risks How many meals a day?: 2 Eats fruit and vegetables daily?: yes Most meals are obtained by: preparing own meals; eating out In the last 2 weeks, have you had any of the following?: none Diabetic:: (!) yes Any non-healing wounds?: no How often do you check your BS?: 0 Would you like to be referred to a Nutritionist or for Diabetic Management? : no  Functional Status Activities of Daily Living (to include ambulation/medication): Independent Ambulation: Independent Home Assistive Devices/Equipment: Wheelchair Medication Administration: Independent Home Management: Independent Manage your own finances?: yes Primary transportation is: driving Concerns about vision?: no *vision screening is required for WTM* Concerns about hearing?: no  Fall Screening Falls in the past year?: 0 Number of falls in past year: 0 Was there an injury with Fall?: 0 Fall Risk Category Calculator: 0 Patient Fall Risk Level: Low Fall Risk  Fall Risk Patient at Risk for Falls Due to: Medication side effect Fall risk Follow up: Falls prevention discussed; Falls evaluation completed  Home and Transportation Safety: All rugs have non-skid backing?: yes All stairs or steps have railings?: yes Grab bars in the bathtub or shower?: (!) no Have non-skid surface in bathtub or shower?: yes Good home lighting?: yes Regular seat belt use?: yes Hospital stays in the last year:: (!) yes How many hospital stays:: 1 Reason: fistula  Cognitive Assessment Difficulty concentrating, remembering, or making decisions? : no Will 6CIT or Mini Cog be Completed: yes What year is it?: 0 points What month is it?: 0 points Give patient an address phrase to remember (5 components): [address] About what time is  it?: 0 points Count backwards from 20 to 1: 0 points Say the months of the year in reverse: 0 points Repeat the address phrase from earlier: 2 points 6 CIT Score: 2 points  Advance Directives (For Healthcare) Does Patient Have a Medical Advance Directive?: Yes Type of Advance Directive: Healthcare Power of Attorney Copy of Healthcare Power of Attorney in Chart?: No - copy requested  Reviewed/Updated  Reviewed/Updated: Reviewed All (Medical, Surgical, Family, Medications, Allergies, Care Teams, Patient Goals)        Objective:    Today's Vitals   There is no height or weight on file to calculate BMI.  Current Medications (verified) Outpatient Encounter Medications as of 06/21/2024  Medication Sig   atorvastatin  (LIPITOR) 80 MG tablet Take 1 tablet (80 mg total) by mouth daily.   calcium  acetate (PHOSLO) 667 MG tablet TAKE 1 TABLET BY MOUTH ONCE DAILY WITH MEALS   polyethylene glycol (MIRALAX / GLYCOLAX) 17 g packet Take 17 g by mouth daily as needed.   sevelamer carbonate (RENVELA) 800 MG tablet TAKE 3 TABLETS BY MOUTH WITH MEALS   No facility-administered encounter medications on file as of 06/21/2024.   Hearing/Vision screen Hearing Screening - Comments:: Denies hearing issues Vision Screening - Comments:: Regular eye exams, Dr. Selinda Slocumb  Immunizations and Health Maintenance Health Maintenance  Topic Date Due   COVID-19 Vaccine (4 - 2025-26 season) 03/28/2024   FOOT EXAM  04/27/2024   Colonoscopy  05/24/2024   HEMOGLOBIN A1C  07/01/2024   OPHTHALMOLOGY EXAM  02/01/2025   Medicare Annual Wellness (AWV)  06/21/2025   Mammogram  06/16/2026   DTaP/Tdap/Td (3 - Td or Tdap) 04/27/2033   Pneumococcal Vaccine: 50+ Years  Completed   Influenza Vaccine  Completed   Bone Density Scan  Completed   Hepatitis C Screening  Completed   Zoster Vaccines- Shingrix  Completed   Meningococcal B Vaccine  Aged Out        Assessment/Plan:  This is a routine wellness examination  for Jennifer Payne.  Patient Care Team: Jarold Medici, MD as PCP - General (Internal Medicine) Jarold Mayo, MD as Consulting Physician (Ophthalmology)  I have personally reviewed and noted the following in the patient's chart:   Medical and social history Use of alcohol, tobacco or illicit drugs  Current medications and supplements including opioid prescriptions. Functional ability and status Nutritional status Physical activity Advanced directives List of other physicians Hospitalizations, surgeries, and ER visits in previous 12 months Vitals Screenings to include cognitive, depression, and falls Referrals and appointments  No orders of the defined types were placed in this encounter.  In addition, I have reviewed and discussed with patient certain preventive protocols, quality metrics, and best practice recommendations. A written personalized care plan for preventive services as well as general preventive health recommendations were provided to patient.   Jennifer FORBES Dawn, LPN   [id number]   Return in 1 year (on 06/21/2025).  After Visit Summary: (MyChart) Due to this being a telephonic visit, the after visit summary with patients personalized plan was offered to patient via MyChart   Nurse Notes: none

## 2024-06-24 DIAGNOSIS — D509 Iron deficiency anemia, unspecified: Secondary | ICD-10-CM | POA: Diagnosis not present

## 2024-06-26 DIAGNOSIS — N186 End stage renal disease: Secondary | ICD-10-CM | POA: Diagnosis not present

## 2024-06-26 DIAGNOSIS — Z992 Dependence on renal dialysis: Secondary | ICD-10-CM | POA: Diagnosis not present

## 2024-07-04 ENCOUNTER — Encounter: Payer: Self-pay | Admitting: Internal Medicine

## 2024-10-13 ENCOUNTER — Ambulatory Visit: Payer: Self-pay | Admitting: Internal Medicine
